# Patient Record
Sex: Male | Born: 1950 | Race: White | Hispanic: No | Marital: Married | Smoking: Former smoker
Health system: Southern US, Community
[De-identification: ages and names within clinical notes are randomized; demographics above are authoritative.]

## PROBLEM LIST (undated history)

## (undated) DIAGNOSIS — T7840XA Allergy, unspecified, initial encounter: Secondary | ICD-10-CM

## (undated) DIAGNOSIS — Z85038 Personal history of other malignant neoplasm of large intestine: Secondary | ICD-10-CM

## (undated) DIAGNOSIS — J309 Allergic rhinitis, unspecified: Secondary | ICD-10-CM

## (undated) DIAGNOSIS — H409 Unspecified glaucoma: Secondary | ICD-10-CM

## (undated) DIAGNOSIS — C801 Malignant (primary) neoplasm, unspecified: Secondary | ICD-10-CM

## (undated) DIAGNOSIS — J45909 Unspecified asthma, uncomplicated: Secondary | ICD-10-CM

## (undated) DIAGNOSIS — I10 Essential (primary) hypertension: Secondary | ICD-10-CM

## (undated) DIAGNOSIS — H269 Unspecified cataract: Secondary | ICD-10-CM

## (undated) DIAGNOSIS — Z8619 Personal history of other infectious and parasitic diseases: Secondary | ICD-10-CM

## (undated) DIAGNOSIS — R7302 Impaired glucose tolerance (oral): Secondary | ICD-10-CM

## (undated) DIAGNOSIS — Z5189 Encounter for other specified aftercare: Secondary | ICD-10-CM

## (undated) DIAGNOSIS — F329 Major depressive disorder, single episode, unspecified: Secondary | ICD-10-CM

## (undated) DIAGNOSIS — E785 Hyperlipidemia, unspecified: Secondary | ICD-10-CM

## (undated) DIAGNOSIS — K219 Gastro-esophageal reflux disease without esophagitis: Secondary | ICD-10-CM

## (undated) DIAGNOSIS — M1990 Unspecified osteoarthritis, unspecified site: Secondary | ICD-10-CM

## (undated) HISTORY — PX: APPENDECTOMY: SHX54

## (undated) HISTORY — DX: Major depressive disorder, single episode, unspecified: F32.9

## (undated) HISTORY — DX: Unspecified osteoarthritis, unspecified site: M19.90

## (undated) HISTORY — DX: Gastro-esophageal reflux disease without esophagitis: K21.9

## (undated) HISTORY — DX: Allergy, unspecified, initial encounter: T78.40XA

## (undated) HISTORY — PX: REPAIR FLEXOR TENDON HAND: SUR1175

## (undated) HISTORY — PX: TONSILLECTOMY: SHX5217

## (undated) HISTORY — DX: Unspecified cataract: H26.9

## (undated) HISTORY — PX: COLOSTOMY: SHX63

## (undated) HISTORY — DX: Unspecified glaucoma: H40.9

## (undated) HISTORY — PX: COLONOSCOPY: SHX174

## (undated) HISTORY — DX: Essential (primary) hypertension: I10

## (undated) HISTORY — DX: Encounter for other specified aftercare: Z51.89

## (undated) HISTORY — PX: OTHER SURGICAL HISTORY: SHX169

## (undated) HISTORY — DX: Impaired glucose tolerance (oral): R73.02

## (undated) HISTORY — DX: Malignant (primary) neoplasm, unspecified: C80.1

## (undated) HISTORY — PX: LIVER SURGERY: SHX698

## (undated) HISTORY — DX: Hyperlipidemia, unspecified: E78.5

## (undated) HISTORY — PX: POLYPECTOMY: SHX149

## (undated) HISTORY — DX: Allergic rhinitis, unspecified: J30.9

## (undated) HISTORY — DX: Personal history of other malignant neoplasm of large intestine: Z85.038

## (undated) HISTORY — DX: Unspecified asthma, uncomplicated: J45.909

## (undated) HISTORY — DX: Personal history of other infectious and parasitic diseases: Z86.19

---

## 1998-07-03 ENCOUNTER — Ambulatory Visit (HOSPITAL_BASED_OUTPATIENT_CLINIC_OR_DEPARTMENT_OTHER): Admission: RE | Admit: 1998-07-03 | Discharge: 1998-07-03 | Payer: Self-pay | Admitting: General Surgery

## 2000-11-17 ENCOUNTER — Encounter (INDEPENDENT_AMBULATORY_CARE_PROVIDER_SITE_OTHER): Payer: Self-pay | Admitting: Specialist

## 2000-11-17 ENCOUNTER — Other Ambulatory Visit: Admission: RE | Admit: 2000-11-17 | Discharge: 2000-11-17 | Payer: Self-pay | Admitting: Gastroenterology

## 2001-10-07 ENCOUNTER — Ambulatory Visit (HOSPITAL_COMMUNITY): Admission: RE | Admit: 2001-10-07 | Discharge: 2001-10-07 | Payer: Self-pay | Admitting: Sports Medicine

## 2001-10-07 ENCOUNTER — Encounter: Payer: Self-pay | Admitting: Sports Medicine

## 2001-11-16 ENCOUNTER — Ambulatory Visit (HOSPITAL_BASED_OUTPATIENT_CLINIC_OR_DEPARTMENT_OTHER): Admission: RE | Admit: 2001-11-16 | Discharge: 2001-11-16 | Payer: Self-pay | Admitting: Orthopedic Surgery

## 2001-12-16 HISTORY — PX: RECTAL SURGERY: SHX760

## 2002-02-08 ENCOUNTER — Encounter (INDEPENDENT_AMBULATORY_CARE_PROVIDER_SITE_OTHER): Payer: Self-pay | Admitting: *Deleted

## 2002-02-08 ENCOUNTER — Ambulatory Visit (HOSPITAL_COMMUNITY): Admission: RE | Admit: 2002-02-08 | Discharge: 2002-02-08 | Payer: Self-pay | Admitting: Family Medicine

## 2002-02-11 ENCOUNTER — Ambulatory Visit (HOSPITAL_COMMUNITY): Admission: RE | Admit: 2002-02-11 | Discharge: 2002-02-11 | Payer: Self-pay | Admitting: Gastroenterology

## 2002-02-12 ENCOUNTER — Ambulatory Visit (HOSPITAL_COMMUNITY): Admission: RE | Admit: 2002-02-12 | Discharge: 2002-02-12 | Payer: Self-pay | Admitting: Gastroenterology

## 2002-02-12 ENCOUNTER — Encounter: Payer: Self-pay | Admitting: Gastroenterology

## 2002-02-19 ENCOUNTER — Encounter (HOSPITAL_BASED_OUTPATIENT_CLINIC_OR_DEPARTMENT_OTHER): Payer: Self-pay | Admitting: General Surgery

## 2002-02-23 ENCOUNTER — Inpatient Hospital Stay (HOSPITAL_COMMUNITY): Admission: RE | Admit: 2002-02-23 | Discharge: 2002-03-05 | Payer: Self-pay | Admitting: General Surgery

## 2002-02-23 ENCOUNTER — Encounter (INDEPENDENT_AMBULATORY_CARE_PROVIDER_SITE_OTHER): Payer: Self-pay | Admitting: *Deleted

## 2002-04-08 ENCOUNTER — Ambulatory Visit: Admission: RE | Admit: 2002-04-08 | Discharge: 2002-07-07 | Payer: Self-pay | Admitting: Radiation Oncology

## 2002-07-30 ENCOUNTER — Ambulatory Visit (HOSPITAL_COMMUNITY): Admission: RE | Admit: 2002-07-30 | Discharge: 2002-07-30 | Payer: Self-pay | Admitting: General Surgery

## 2002-07-30 ENCOUNTER — Encounter (HOSPITAL_BASED_OUTPATIENT_CLINIC_OR_DEPARTMENT_OTHER): Payer: Self-pay | Admitting: General Surgery

## 2002-09-15 ENCOUNTER — Encounter: Admission: RE | Admit: 2002-09-15 | Discharge: 2002-12-14 | Payer: Self-pay

## 2002-12-29 ENCOUNTER — Encounter: Admission: RE | Admit: 2002-12-29 | Discharge: 2003-03-29 | Payer: Self-pay

## 2003-04-11 ENCOUNTER — Ambulatory Visit (HOSPITAL_COMMUNITY): Admission: RE | Admit: 2003-04-11 | Discharge: 2003-04-11 | Payer: Self-pay | Admitting: Oncology

## 2003-04-11 ENCOUNTER — Encounter (HOSPITAL_COMMUNITY): Payer: Self-pay | Admitting: Oncology

## 2003-04-26 ENCOUNTER — Emergency Department (HOSPITAL_COMMUNITY): Admission: EM | Admit: 2003-04-26 | Discharge: 2003-04-26 | Payer: Self-pay | Admitting: Emergency Medicine

## 2003-04-26 ENCOUNTER — Encounter: Payer: Self-pay | Admitting: Emergency Medicine

## 2003-05-08 ENCOUNTER — Encounter: Payer: Self-pay | Admitting: Emergency Medicine

## 2003-05-08 ENCOUNTER — Inpatient Hospital Stay (HOSPITAL_COMMUNITY): Admission: EM | Admit: 2003-05-08 | Discharge: 2003-05-10 | Payer: Self-pay | Admitting: *Deleted

## 2003-05-09 ENCOUNTER — Encounter: Payer: Self-pay | Admitting: Cardiology

## 2003-09-07 ENCOUNTER — Encounter (HOSPITAL_COMMUNITY): Payer: Self-pay | Admitting: Oncology

## 2003-09-07 ENCOUNTER — Ambulatory Visit (HOSPITAL_COMMUNITY): Admission: RE | Admit: 2003-09-07 | Discharge: 2003-09-07 | Payer: Self-pay | Admitting: Oncology

## 2004-02-14 ENCOUNTER — Ambulatory Visit (HOSPITAL_COMMUNITY): Admission: RE | Admit: 2004-02-14 | Discharge: 2004-02-14 | Payer: Self-pay | Admitting: Oncology

## 2005-01-23 ENCOUNTER — Ambulatory Visit: Payer: Self-pay | Admitting: Internal Medicine

## 2005-02-13 ENCOUNTER — Ambulatory Visit: Payer: Self-pay | Admitting: Oncology

## 2005-09-23 ENCOUNTER — Ambulatory Visit: Payer: Self-pay | Admitting: Oncology

## 2005-09-23 ENCOUNTER — Ambulatory Visit (HOSPITAL_COMMUNITY): Admission: RE | Admit: 2005-09-23 | Discharge: 2005-09-23 | Payer: Self-pay | Admitting: Oncology

## 2005-10-02 ENCOUNTER — Emergency Department (HOSPITAL_COMMUNITY): Admission: EM | Admit: 2005-10-02 | Discharge: 2005-10-03 | Payer: Self-pay | Admitting: Emergency Medicine

## 2006-03-07 ENCOUNTER — Ambulatory Visit: Payer: Self-pay | Admitting: Internal Medicine

## 2006-03-20 ENCOUNTER — Ambulatory Visit: Payer: Self-pay | Admitting: Internal Medicine

## 2006-03-26 ENCOUNTER — Ambulatory Visit: Payer: Self-pay | Admitting: Oncology

## 2006-03-27 LAB — CBC WITH DIFFERENTIAL/PLATELET
Basophils Absolute: 0 10*3/uL (ref 0.0–0.1)
EOS%: 2.8 % (ref 0.0–7.0)
Eosinophils Absolute: 0.2 10*3/uL (ref 0.0–0.5)
HGB: 14 g/dL (ref 13.0–17.1)
MCH: 30.4 pg (ref 28.0–33.4)
NEUT#: 3.2 10*3/uL (ref 1.5–6.5)
RBC: 4.61 10*6/uL (ref 4.20–5.71)
RDW: 12.6 % (ref 11.2–14.6)
lymph#: 1.7 10*3/uL (ref 0.9–3.3)

## 2006-03-27 LAB — COMPREHENSIVE METABOLIC PANEL
ALT: 22 U/L (ref 0–40)
Albumin: 4.2 g/dL (ref 3.5–5.2)
Alkaline Phosphatase: 51 U/L (ref 39–117)
Potassium: 3.8 mEq/L (ref 3.5–5.3)
Sodium: 137 mEq/L (ref 135–145)
Total Bilirubin: 0.5 mg/dL (ref 0.3–1.2)
Total Protein: 7.1 g/dL (ref 6.0–8.3)

## 2006-04-11 ENCOUNTER — Ambulatory Visit: Payer: Self-pay | Admitting: Internal Medicine

## 2006-04-14 ENCOUNTER — Ambulatory Visit: Payer: Self-pay | Admitting: Internal Medicine

## 2006-09-25 ENCOUNTER — Ambulatory Visit: Payer: Self-pay | Admitting: Oncology

## 2006-10-09 ENCOUNTER — Ambulatory Visit (HOSPITAL_COMMUNITY): Admission: RE | Admit: 2006-10-09 | Discharge: 2006-10-09 | Payer: Self-pay | Admitting: Oncology

## 2006-10-22 ENCOUNTER — Encounter (INDEPENDENT_AMBULATORY_CARE_PROVIDER_SITE_OTHER): Payer: Self-pay | Admitting: Specialist

## 2006-10-22 ENCOUNTER — Ambulatory Visit (HOSPITAL_COMMUNITY): Admission: RE | Admit: 2006-10-22 | Discharge: 2006-10-22 | Payer: Self-pay | Admitting: Oncology

## 2006-10-23 ENCOUNTER — Ambulatory Visit (HOSPITAL_COMMUNITY): Admission: RE | Admit: 2006-10-23 | Discharge: 2006-10-23 | Payer: Self-pay | Admitting: Oncology

## 2006-10-28 LAB — CEA: CEA: 9.8 ng/mL — ABNORMAL HIGH (ref 0.0–5.0)

## 2006-10-28 LAB — COMPREHENSIVE METABOLIC PANEL
Alkaline Phosphatase: 56 U/L (ref 39–117)
CO2: 25 mEq/L (ref 19–32)
Creatinine, Ser: 0.92 mg/dL (ref 0.40–1.50)
Glucose, Bld: 91 mg/dL (ref 70–99)
Sodium: 136 mEq/L (ref 135–145)
Total Bilirubin: 0.5 mg/dL (ref 0.3–1.2)

## 2006-10-28 LAB — CBC WITH DIFFERENTIAL/PLATELET
Eosinophils Absolute: 0.1 10*3/uL (ref 0.0–0.5)
HCT: 38.3 % — ABNORMAL LOW (ref 38.7–49.9)
LYMPH%: 28.5 % (ref 14.0–48.0)
MCHC: 34.8 g/dL (ref 32.0–35.9)
MCV: 89.6 fL (ref 81.6–98.0)
MONO#: 0.6 10*3/uL (ref 0.1–0.9)
MONO%: 10 % (ref 0.0–13.0)
NEUT#: 3.8 10*3/uL (ref 1.5–6.5)
NEUT%: 59.6 % (ref 40.0–75.0)
Platelets: 282 10*3/uL (ref 145–400)
RBC: 4.27 10*6/uL (ref 4.20–5.71)
WBC: 6.4 10*3/uL (ref 4.0–10.0)

## 2006-10-28 LAB — LACTATE DEHYDROGENASE: LDH: 197 U/L (ref 94–250)

## 2006-11-07 LAB — CBC WITH DIFFERENTIAL/PLATELET
Basophils Absolute: 0 10*3/uL (ref 0.0–0.1)
EOS%: 2.4 % (ref 0.0–7.0)
Eosinophils Absolute: 0.2 10*3/uL (ref 0.0–0.5)
HCT: 40 % (ref 38.7–49.9)
HGB: 13.9 g/dL (ref 13.0–17.1)
MCH: 31 pg (ref 28.0–33.4)
MONO#: 0.7 10*3/uL (ref 0.1–0.9)
NEUT#: 4.7 10*3/uL (ref 1.5–6.5)
NEUT%: 62.9 % (ref 40.0–75.0)
RDW: 11.8 % (ref 11.2–14.6)
WBC: 7.5 10*3/uL (ref 4.0–10.0)
lymph#: 1.9 10*3/uL (ref 0.9–3.3)

## 2006-11-11 ENCOUNTER — Ambulatory Visit (HOSPITAL_COMMUNITY): Admission: RE | Admit: 2006-11-11 | Discharge: 2006-11-11 | Payer: Self-pay | Admitting: Oncology

## 2006-11-12 ENCOUNTER — Ambulatory Visit: Payer: Self-pay | Admitting: Oncology

## 2006-11-14 LAB — COMPREHENSIVE METABOLIC PANEL
Albumin: 3.9 g/dL (ref 3.5–5.2)
BUN: 14 mg/dL (ref 6–23)
CO2: 25 mEq/L (ref 19–32)
Calcium: 8.8 mg/dL (ref 8.4–10.5)
Chloride: 102 mEq/L (ref 96–112)
Creatinine, Ser: 1 mg/dL (ref 0.40–1.50)
Potassium: 3.9 mEq/L (ref 3.5–5.3)

## 2006-11-14 LAB — CBC WITH DIFFERENTIAL/PLATELET
Basophils Absolute: 0 10*3/uL (ref 0.0–0.1)
Eosinophils Absolute: 0.1 10*3/uL (ref 0.0–0.5)
HCT: 35.5 % — ABNORMAL LOW (ref 38.7–49.9)
HGB: 12.6 g/dL — ABNORMAL LOW (ref 13.0–17.1)
LYMPH%: 24 % (ref 14.0–48.0)
MONO#: 0.5 10*3/uL (ref 0.1–0.9)
NEUT#: 3.9 10*3/uL (ref 1.5–6.5)
NEUT%: 64.6 % (ref 40.0–75.0)
Platelets: 291 10*3/uL (ref 145–400)
WBC: 6.1 10*3/uL (ref 4.0–10.0)

## 2006-11-14 LAB — LACTATE DEHYDROGENASE: LDH: 164 U/L (ref 94–250)

## 2006-11-21 LAB — CBC WITH DIFFERENTIAL/PLATELET
BASO%: 0.4 % (ref 0.0–2.0)
EOS%: 4.5 % (ref 0.0–7.0)
MCH: 31.3 pg (ref 28.0–33.4)
MCHC: 35 g/dL (ref 32.0–35.9)
MONO#: 0.5 10*3/uL (ref 0.1–0.9)
NEUT%: 49.7 % (ref 40.0–75.0)
RBC: 4.1 10*6/uL — ABNORMAL LOW (ref 4.20–5.71)
RDW: 12.7 % (ref 11.2–14.6)
WBC: 5.1 10*3/uL (ref 4.0–10.0)
lymph#: 1.8 10*3/uL (ref 0.9–3.3)

## 2006-11-27 LAB — CBC WITH DIFFERENTIAL/PLATELET
BASO%: 0.4 % (ref 0.0–2.0)
Basophils Absolute: 0 10*3/uL (ref 0.0–0.1)
EOS%: 3.6 % (ref 0.0–7.0)
HCT: 35.1 % — ABNORMAL LOW (ref 38.7–49.9)
HGB: 12.3 g/dL — ABNORMAL LOW (ref 13.0–17.1)
MCH: 31.4 pg (ref 28.0–33.4)
MONO#: 0.6 10*3/uL (ref 0.1–0.9)
NEUT%: 46.6 % (ref 40.0–75.0)
RDW: 12.1 % (ref 11.2–14.6)
WBC: 4.5 10*3/uL (ref 4.0–10.0)
lymph#: 1.6 10*3/uL (ref 0.9–3.3)

## 2006-11-27 LAB — COMPREHENSIVE METABOLIC PANEL
AST: 24 U/L (ref 0–37)
Albumin: 3.9 g/dL (ref 3.5–5.2)
Alkaline Phosphatase: 63 U/L (ref 39–117)
Calcium: 8.7 mg/dL (ref 8.4–10.5)
Chloride: 103 mEq/L (ref 96–112)
Potassium: 4.1 mEq/L (ref 3.5–5.3)
Sodium: 136 mEq/L (ref 135–145)
Total Protein: 6.4 g/dL (ref 6.0–8.3)

## 2006-12-04 LAB — CBC WITH DIFFERENTIAL/PLATELET
Basophils Absolute: 0.1 10*3/uL (ref 0.0–0.1)
Eosinophils Absolute: 0.3 10*3/uL (ref 0.0–0.5)
HGB: 13.2 g/dL (ref 13.0–17.1)
LYMPH%: 53.5 % — ABNORMAL HIGH (ref 14.0–48.0)
MCV: 86.6 fL (ref 81.6–98.0)
MONO%: 9 % (ref 0.0–13.0)
NEUT#: 0.9 10*3/uL — ABNORMAL LOW (ref 1.5–6.5)
Platelets: 195 10*3/uL (ref 145–400)
RBC: 4.21 10*6/uL (ref 4.20–5.71)

## 2006-12-12 LAB — COMPREHENSIVE METABOLIC PANEL
ALT: 34 U/L (ref 0–53)
Alkaline Phosphatase: 80 U/L (ref 39–117)
Sodium: 140 mEq/L (ref 135–145)
Total Bilirubin: 1 mg/dL (ref 0.3–1.2)
Total Protein: 6.9 g/dL (ref 6.0–8.3)

## 2006-12-12 LAB — CBC WITH DIFFERENTIAL/PLATELET
BASO%: 0.2 % (ref 0.0–2.0)
LYMPH%: 38.3 % (ref 14.0–48.0)
MCHC: 35.1 g/dL (ref 32.0–35.9)
MCV: 91.6 fL (ref 81.6–98.0)
MONO%: 14.5 % — ABNORMAL HIGH (ref 0.0–13.0)
Platelets: 182 10*3/uL (ref 145–400)
RBC: 4.24 10*6/uL (ref 4.20–5.71)

## 2007-01-06 ENCOUNTER — Ambulatory Visit: Payer: Self-pay | Admitting: Oncology

## 2007-01-20 ENCOUNTER — Ambulatory Visit: Payer: Self-pay | Admitting: Internal Medicine

## 2007-02-03 LAB — CBC WITH DIFFERENTIAL/PLATELET
Basophils Absolute: 0 10*3/uL (ref 0.0–0.1)
EOS%: 2 % (ref 0.0–7.0)
HCT: 35.2 % — ABNORMAL LOW (ref 38.7–49.9)
HGB: 12.4 g/dL — ABNORMAL LOW (ref 13.0–17.1)
LYMPH%: 25.7 % (ref 14.0–48.0)
MCH: 32 pg (ref 28.0–33.4)
MCHC: 35.2 g/dL (ref 32.0–35.9)
MCV: 90.9 fL (ref 81.6–98.0)
MONO%: 7.2 % (ref 0.0–13.0)
NEUT%: 64.5 % (ref 40.0–75.0)
Platelets: 578 10*3/uL — ABNORMAL HIGH (ref 145–400)

## 2007-02-03 LAB — COMPREHENSIVE METABOLIC PANEL
AST: 16 U/L (ref 0–37)
Alkaline Phosphatase: 73 U/L (ref 39–117)
BUN: 20 mg/dL (ref 6–23)
Calcium: 9.4 mg/dL (ref 8.4–10.5)
Chloride: 102 mEq/L (ref 96–112)
Creatinine, Ser: 1.39 mg/dL (ref 0.40–1.50)
Total Bilirubin: 0.4 mg/dL (ref 0.3–1.2)

## 2007-02-19 LAB — COMPREHENSIVE METABOLIC PANEL
ALT: 12 U/L (ref 0–53)
Alkaline Phosphatase: 62 U/L (ref 39–117)
CO2: 25 mEq/L (ref 19–32)
Creatinine, Ser: 1.03 mg/dL (ref 0.40–1.50)
Total Bilirubin: 0.5 mg/dL (ref 0.3–1.2)

## 2007-02-19 LAB — CBC WITH DIFFERENTIAL/PLATELET
EOS%: 3.5 % (ref 0.0–7.0)
Eosinophils Absolute: 0.2 10*3/uL (ref 0.0–0.5)
LYMPH%: 24 % (ref 14.0–48.0)
MCH: 31.5 pg (ref 28.0–33.4)
MCHC: 34.8 g/dL (ref 32.0–35.9)
MCV: 90.6 fL (ref 81.6–98.0)
MONO%: 8.2 % (ref 0.0–13.0)
NEUT#: 4.3 10*3/uL (ref 1.5–6.5)
Platelets: 224 10*3/uL (ref 145–400)
RBC: 4.11 10*6/uL — ABNORMAL LOW (ref 4.20–5.71)

## 2007-02-19 LAB — LACTATE DEHYDROGENASE: LDH: 138 U/L (ref 94–250)

## 2007-02-19 LAB — UA PROTEIN, DIPSTICK - CHCC: Protein, Urine: NEGATIVE mg/dL

## 2007-02-19 LAB — CEA: CEA: 0.8 ng/mL (ref 0.0–5.0)

## 2007-02-24 ENCOUNTER — Ambulatory Visit: Payer: Self-pay | Admitting: Oncology

## 2007-02-26 LAB — CBC WITH DIFFERENTIAL/PLATELET
Basophils Absolute: 0 10*3/uL (ref 0.0–0.1)
HCT: 36.5 % — ABNORMAL LOW (ref 38.7–49.9)
HGB: 13 g/dL (ref 13.0–17.1)
MONO#: 0.4 10*3/uL (ref 0.1–0.9)
NEUT#: 4.4 10*3/uL (ref 1.5–6.5)
NEUT%: 63.5 % (ref 40.0–75.0)
RDW: 12.3 % (ref 11.2–14.6)
WBC: 6.9 10*3/uL (ref 4.0–10.0)
lymph#: 1.8 10*3/uL (ref 0.9–3.3)

## 2007-03-06 LAB — COMPREHENSIVE METABOLIC PANEL WITH GFR
ALT: 19 U/L (ref 0–53)
AST: 18 U/L (ref 0–37)
Albumin: 3.6 g/dL (ref 3.5–5.2)
Alkaline Phosphatase: 61 U/L (ref 39–117)
BUN: 14 mg/dL (ref 6–23)
CO2: 27 meq/L (ref 19–32)
Calcium: 9 mg/dL (ref 8.4–10.5)
Chloride: 101 meq/L (ref 96–112)
Creatinine, Ser: 0.91 mg/dL (ref 0.40–1.50)
Glucose, Bld: 105 mg/dL — ABNORMAL HIGH (ref 70–99)
Potassium: 3.6 meq/L (ref 3.5–5.3)
Sodium: 137 meq/L (ref 135–145)
Total Bilirubin: 0.4 mg/dL (ref 0.3–1.2)
Total Protein: 6.3 g/dL (ref 6.0–8.3)

## 2007-03-06 LAB — CBC WITH DIFFERENTIAL/PLATELET
BASO%: 0.8 % (ref 0.0–2.0)
Basophils Absolute: 0 10e3/uL (ref 0.0–0.1)
EOS%: 3.5 % (ref 0.0–7.0)
Eosinophils Absolute: 0.2 10e3/uL (ref 0.0–0.5)
HCT: 36.3 % — ABNORMAL LOW (ref 38.7–49.9)
HGB: 13.1 g/dL (ref 13.0–17.1)
LYMPH%: 32.2 % (ref 14.0–48.0)
MCH: 31.4 pg (ref 28.0–33.4)
MCHC: 36 g/dL — ABNORMAL HIGH (ref 32.0–35.9)
MCV: 87.2 fL (ref 81.6–98.0)
MONO#: 0.6 10e3/uL (ref 0.1–0.9)
MONO%: 11.1 % (ref 0.0–13.0)
NEUT#: 2.8 10e3/uL (ref 1.5–6.5)
NEUT%: 52.5 % (ref 40.0–75.0)
Platelets: 251 10e3/uL (ref 145–400)
RBC: 4.17 10e6/uL — ABNORMAL LOW (ref 4.20–5.71)
RDW: 11.9 % (ref 11.2–14.6)
WBC: 5.3 10e3/uL (ref 4.0–10.0)
lymph#: 1.7 10e3/uL (ref 0.9–3.3)

## 2007-03-06 LAB — UA PROTEIN, DIPSTICK - CHCC: Protein, Urine: <30 mg/dL

## 2007-03-06 LAB — LACTATE DEHYDROGENASE: LDH: 131 U/L (ref 94–250)

## 2007-03-20 LAB — CBC WITH DIFFERENTIAL/PLATELET
EOS%: 2 % (ref 0.0–7.0)
Eosinophils Absolute: 0.1 10*3/uL (ref 0.0–0.5)
MCV: 88.4 fL (ref 81.6–98.0)
MONO%: 12 % (ref 0.0–13.0)
NEUT#: 3.3 10*3/uL (ref 1.5–6.5)
RBC: 4.44 10*6/uL (ref 4.20–5.71)
RDW: 13.6 % (ref 11.2–14.6)

## 2007-03-27 LAB — CBC WITH DIFFERENTIAL/PLATELET
BASO%: 2.8 % — ABNORMAL HIGH (ref 0.0–2.0)
HCT: 39.8 % (ref 38.7–49.9)
MCHC: 35.8 g/dL (ref 32.0–35.9)
MONO#: 0.7 10*3/uL (ref 0.1–0.9)
RBC: 4.5 10*6/uL (ref 4.20–5.71)
RDW: 13.6 % (ref 11.2–14.6)
WBC: 3.8 10*3/uL — ABNORMAL LOW (ref 4.0–10.0)
lymph#: 2 10*3/uL (ref 0.9–3.3)

## 2007-03-27 LAB — UA PROTEIN, DIPSTICK - CHCC: Protein, Urine: NEGATIVE mg/dL

## 2007-04-03 LAB — COMPREHENSIVE METABOLIC PANEL
ALT: 19 U/L (ref 0–53)
AST: 20 U/L (ref 0–37)
Albumin: 4 g/dL (ref 3.5–5.2)
Alkaline Phosphatase: 67 U/L (ref 39–117)
BUN: 17 mg/dL (ref 6–23)
CO2: 25 mEq/L (ref 19–32)
Calcium: 8.8 mg/dL (ref 8.4–10.5)
Chloride: 100 mEq/L (ref 96–112)
Creatinine, Ser: 0.87 mg/dL (ref 0.40–1.50)
Glucose, Bld: 105 mg/dL — ABNORMAL HIGH (ref 70–99)
Potassium: 3.9 mEq/L (ref 3.5–5.3)
Sodium: 137 mEq/L (ref 135–145)
Total Bilirubin: 0.5 mg/dL (ref 0.3–1.2)
Total Protein: 6.7 g/dL (ref 6.0–8.3)

## 2007-04-03 LAB — CBC WITH DIFFERENTIAL/PLATELET
Basophils Absolute: 0.2 10*3/uL — ABNORMAL HIGH (ref 0.0–0.1)
HCT: 42.3 % (ref 38.7–49.9)
HGB: 15.3 g/dL (ref 13.0–17.1)
MONO#: 0.7 10*3/uL (ref 0.1–0.9)
NEUT#: 3.7 10*3/uL (ref 1.5–6.5)
NEUT%: 49.1 % (ref 40.0–75.0)
WBC: 7.6 10*3/uL (ref 4.0–10.0)
lymph#: 2.7 10*3/uL (ref 0.9–3.3)

## 2007-04-03 LAB — LACTATE DEHYDROGENASE: LDH: 150 U/L (ref 94–250)

## 2007-04-09 ENCOUNTER — Ambulatory Visit: Payer: Self-pay | Admitting: Internal Medicine

## 2007-04-09 LAB — CBC WITH DIFFERENTIAL/PLATELET
BASO%: 0.2 % (ref 0.0–2.0)
EOS%: 1.1 % (ref 0.0–7.0)
MCH: 32.5 pg (ref 28.0–33.4)
MCHC: 36.3 g/dL — ABNORMAL HIGH (ref 32.0–35.9)
MONO#: 0.3 10*3/uL (ref 0.1–0.9)
RBC: 4.3 10*6/uL (ref 4.20–5.71)
WBC: 7.3 10*3/uL (ref 4.0–10.0)
lymph#: 2.3 10*3/uL (ref 0.9–3.3)

## 2007-04-14 ENCOUNTER — Ambulatory Visit: Payer: Self-pay | Admitting: Oncology

## 2007-04-17 LAB — CBC WITH DIFFERENTIAL/PLATELET
BASO%: 0.3 % (ref 0.0–2.0)
Basophils Absolute: 0 10*3/uL (ref 0.0–0.1)
EOS%: 4 % (ref 0.0–7.0)
HCT: 38.9 % (ref 38.7–49.9)
HGB: 13.9 g/dL (ref 13.0–17.1)
MCHC: 35.8 g/dL (ref 32.0–35.9)
MONO#: 0.8 10*3/uL (ref 0.1–0.9)
NEUT%: 63.7 % (ref 40.0–75.0)
RDW: 15.8 % — ABNORMAL HIGH (ref 11.2–14.6)
WBC: 8.2 10*3/uL (ref 4.0–10.0)
lymph#: 1.9 10*3/uL (ref 0.9–3.3)

## 2007-04-21 ENCOUNTER — Ambulatory Visit: Payer: Self-pay | Admitting: Cardiology

## 2007-04-22 LAB — COMPREHENSIVE METABOLIC PANEL
AST: 18 U/L (ref 0–37)
Albumin: 3.9 g/dL (ref 3.5–5.2)
Alkaline Phosphatase: 72 U/L (ref 39–117)
Glucose, Bld: 123 mg/dL — ABNORMAL HIGH (ref 70–99)
Potassium: 3.5 mEq/L (ref 3.5–5.3)
Sodium: 136 mEq/L (ref 135–145)
Total Bilirubin: 0.4 mg/dL (ref 0.3–1.2)
Total Protein: 6.8 g/dL (ref 6.0–8.3)

## 2007-04-22 LAB — CBC WITH DIFFERENTIAL/PLATELET
Basophils Absolute: 0.1 10*3/uL (ref 0.0–0.1)
EOS%: 14.1 % — ABNORMAL HIGH (ref 0.0–7.0)
Eosinophils Absolute: 0.7 10*3/uL — ABNORMAL HIGH (ref 0.0–0.5)
HCT: 37.5 % — ABNORMAL LOW (ref 38.7–49.9)
HGB: 13.6 g/dL (ref 13.0–17.1)
MCH: 32.2 pg (ref 28.0–33.4)
MONO#: 0.7 10*3/uL (ref 0.1–0.9)
NEUT#: 1.1 10*3/uL — ABNORMAL LOW (ref 1.5–6.5)
NEUT%: 22.4 % — ABNORMAL LOW (ref 40.0–75.0)
RDW: 15.7 % — ABNORMAL HIGH (ref 11.2–14.6)
lymph#: 2.4 10*3/uL (ref 0.9–3.3)

## 2007-04-22 LAB — LACTATE DEHYDROGENASE: LDH: 138 U/L (ref 94–250)

## 2007-04-22 LAB — CEA: CEA: 1.1 ng/mL (ref 0.0–5.0)

## 2007-04-24 LAB — CBC WITH DIFFERENTIAL/PLATELET
Basophils Absolute: 0.2 10*3/uL — ABNORMAL HIGH (ref 0.0–0.1)
HCT: 41.4 % (ref 38.7–49.9)
HGB: 14.9 g/dL (ref 13.0–17.1)
LYMPH%: 19.2 % (ref 14.0–48.0)
MCH: 31.7 pg (ref 28.0–33.4)
MCHC: 36.1 g/dL — ABNORMAL HIGH (ref 32.0–35.9)
MONO#: 1.2 10*3/uL — ABNORMAL HIGH (ref 0.1–0.9)
NEUT%: 59.4 % (ref 40.0–75.0)
Platelets: 318 10*3/uL (ref 145–400)
WBC: 11.2 10*3/uL — ABNORMAL HIGH (ref 4.0–10.0)
lymph#: 2.2 10*3/uL (ref 0.9–3.3)

## 2007-05-01 LAB — CBC WITH DIFFERENTIAL/PLATELET
BASO%: 0.3 % (ref 0.0–2.0)
Basophils Absolute: 0 10*3/uL (ref 0.0–0.1)
EOS%: 3 % (ref 0.0–7.0)
HCT: 40.3 % (ref 38.7–49.9)
HGB: 14.7 g/dL (ref 13.0–17.1)
LYMPH%: 25.8 % (ref 14.0–48.0)
MCH: 32.5 pg (ref 28.0–33.4)
MCHC: 36.3 g/dL — ABNORMAL HIGH (ref 32.0–35.9)
NEUT%: 68.7 % (ref 40.0–75.0)
Platelets: 113 10*3/uL — ABNORMAL LOW (ref 145–400)
lymph#: 1.5 10*3/uL (ref 0.9–3.3)

## 2007-05-07 ENCOUNTER — Ambulatory Visit: Payer: Self-pay

## 2007-05-08 LAB — CBC WITH DIFFERENTIAL/PLATELET
BASO%: 1.6 % (ref 0.0–2.0)
Basophils Absolute: 0.2 10*3/uL — ABNORMAL HIGH (ref 0.0–0.1)
EOS%: 4.3 % (ref 0.0–7.0)
HCT: 40 % (ref 38.7–49.9)
HGB: 14.3 g/dL (ref 13.0–17.1)
MCH: 31.6 pg (ref 28.0–33.4)
MCHC: 35.6 g/dL (ref 32.0–35.9)
MCV: 88.6 fL (ref 81.6–98.0)
MONO%: 7.3 % (ref 0.0–13.0)
NEUT%: 58.5 % (ref 40.0–75.0)
RDW: 13.8 % (ref 11.2–14.6)
lymph#: 3.2 10*3/uL (ref 0.9–3.3)

## 2007-05-15 LAB — CBC WITH DIFFERENTIAL/PLATELET
BASO%: 0.7 % (ref 0.0–2.0)
EOS%: 3.3 % (ref 0.0–7.0)
LYMPH%: 25.9 % (ref 14.0–48.0)
MCHC: 36.5 g/dL — ABNORMAL HIGH (ref 32.0–35.9)
MCV: 89.6 fL (ref 81.6–98.0)
MONO%: 3.7 % (ref 0.0–13.0)
Platelets: 183 10*3/uL (ref 145–400)
RBC: 4.12 10*6/uL — ABNORMAL LOW (ref 4.20–5.71)
RDW: 16.1 % — ABNORMAL HIGH (ref 11.2–14.6)

## 2007-05-22 LAB — CBC WITH DIFFERENTIAL/PLATELET
BASO%: 0.8 % (ref 0.0–2.0)
LYMPH%: 23.1 % (ref 14.0–48.0)
MCHC: 34.9 g/dL (ref 32.0–35.9)
MONO#: 1 10*3/uL — ABNORMAL HIGH (ref 0.1–0.9)
NEUT#: 7.4 10*3/uL — ABNORMAL HIGH (ref 1.5–6.5)
RBC: 4.43 10*6/uL (ref 4.20–5.71)
RDW: 15.2 % — ABNORMAL HIGH (ref 11.2–14.6)
WBC: 11.5 10*3/uL — ABNORMAL HIGH (ref 4.0–10.0)
lymph#: 2.7 10*3/uL (ref 0.9–3.3)

## 2007-05-22 LAB — COMPREHENSIVE METABOLIC PANEL
ALT: 14 U/L (ref 0–53)
Albumin: 4 g/dL (ref 3.5–5.2)
CO2: 22 mEq/L (ref 19–32)
Potassium: 4 mEq/L (ref 3.5–5.3)
Sodium: 134 mEq/L — ABNORMAL LOW (ref 135–145)
Total Bilirubin: 0.9 mg/dL (ref 0.3–1.2)
Total Protein: 6.8 g/dL (ref 6.0–8.3)

## 2007-05-22 LAB — LACTATE DEHYDROGENASE: LDH: 222 U/L (ref 94–250)

## 2007-05-29 LAB — CBC WITH DIFFERENTIAL/PLATELET
BASO%: 0.2 % (ref 0.0–2.0)
Eosinophils Absolute: 0.2 10*3/uL (ref 0.0–0.5)
HCT: 32.3 % — ABNORMAL LOW (ref 38.7–49.9)
LYMPH%: 10.9 % — ABNORMAL LOW (ref 14.0–48.0)
MONO#: 0.7 10*3/uL (ref 0.1–0.9)
NEUT#: 17.1 10*3/uL — ABNORMAL HIGH (ref 1.5–6.5)
NEUT%: 84.6 % — ABNORMAL HIGH (ref 40.0–75.0)
Platelets: 191 10*3/uL (ref 145–400)
WBC: 20.2 10*3/uL — ABNORMAL HIGH (ref 4.0–10.0)
lymph#: 2.2 10*3/uL (ref 0.9–3.3)

## 2007-06-03 ENCOUNTER — Ambulatory Visit: Payer: Self-pay | Admitting: Oncology

## 2007-06-05 LAB — CBC WITH DIFFERENTIAL/PLATELET
BASO%: 0.6 % (ref 0.0–2.0)
Basophils Absolute: 0.1 10*3/uL (ref 0.0–0.1)
HCT: 35.6 % — ABNORMAL LOW (ref 38.7–49.9)
HGB: 12.5 g/dL — ABNORMAL LOW (ref 13.0–17.1)
LYMPH%: 14.7 % (ref 14.0–48.0)
MCH: 33.2 pg (ref 28.0–33.4)
MCHC: 35.2 g/dL (ref 32.0–35.9)
MONO#: 0.7 10*3/uL (ref 0.1–0.9)
NEUT%: 75 % (ref 40.0–75.0)
Platelets: 167 10*3/uL (ref 145–400)
WBC: 11 10*3/uL — ABNORMAL HIGH (ref 4.0–10.0)
lymph#: 1.6 10*3/uL (ref 0.9–3.3)

## 2007-06-12 LAB — COMPREHENSIVE METABOLIC PANEL
ALT: 10 U/L (ref 0–53)
AST: 16 U/L (ref 0–37)
Alkaline Phosphatase: 80 U/L (ref 39–117)
BUN: 14 mg/dL (ref 6–23)
Calcium: 9.5 mg/dL (ref 8.4–10.5)
Chloride: 103 mEq/L (ref 96–112)
Creatinine, Ser: 1 mg/dL (ref 0.40–1.50)
Total Bilirubin: 0.7 mg/dL (ref 0.3–1.2)

## 2007-06-12 LAB — CBC WITH DIFFERENTIAL/PLATELET
BASO%: 0.9 % (ref 0.0–2.0)
Basophils Absolute: 0.1 10*3/uL (ref 0.0–0.1)
EOS%: 8.8 % — ABNORMAL HIGH (ref 0.0–7.0)
HCT: 39.1 % (ref 38.7–49.9)
HGB: 14.3 g/dL (ref 13.0–17.1)
LYMPH%: 27.2 % (ref 14.0–48.0)
MCH: 33.5 pg — ABNORMAL HIGH (ref 28.0–33.4)
MCHC: 36.5 g/dL — ABNORMAL HIGH (ref 32.0–35.9)
MCV: 91.9 fL (ref 81.6–98.0)
MONO%: 6.9 % (ref 0.0–13.0)
NEUT%: 56.3 % (ref 40.0–75.0)

## 2007-06-26 LAB — CBC WITH DIFFERENTIAL/PLATELET
Basophils Absolute: 0.1 10*3/uL (ref 0.0–0.1)
Eosinophils Absolute: 0.7 10*3/uL — ABNORMAL HIGH (ref 0.0–0.5)
HGB: 12.9 g/dL — ABNORMAL LOW (ref 13.0–17.1)
LYMPH%: 29.5 % (ref 14.0–48.0)
MCV: 93.7 fL (ref 81.6–98.0)
MONO%: 6.6 % (ref 0.0–13.0)
NEUT#: 3.7 10*3/uL (ref 1.5–6.5)
NEUT%: 53.3 % (ref 40.0–75.0)
Platelets: 153 10*3/uL (ref 145–400)

## 2007-07-01 LAB — CBC WITH DIFFERENTIAL/PLATELET
BASO%: 0.6 % (ref 0.0–2.0)
Eosinophils Absolute: 0.3 10*3/uL (ref 0.0–0.5)
LYMPH%: 26.4 % (ref 14.0–48.0)
MCHC: 35.2 g/dL (ref 32.0–35.9)
MCV: 95.4 fL (ref 81.6–98.0)
MONO%: 11.4 % (ref 0.0–13.0)
Platelets: 217 10*3/uL (ref 145–400)
RBC: 3.82 10*6/uL — ABNORMAL LOW (ref 4.20–5.71)

## 2007-07-01 LAB — UA PROTEIN, DIPSTICK - CHCC: Protein, Urine: <30 mg/dL

## 2007-07-01 LAB — COMPREHENSIVE METABOLIC PANEL
ALT: 14 U/L (ref 0–53)
CO2: 24 mEq/L (ref 19–32)
Creatinine, Ser: 1.14 mg/dL (ref 0.40–1.50)
Total Bilirubin: 0.6 mg/dL (ref 0.3–1.2)

## 2007-07-21 IMAGING — PT NM PET TUM IMG SKULL BASE T - THIGH
4 series · 25 of 25 positions shown · non-contrast
Comparison: CT from 10/09/06.

CLINICAL DATA: Anal/rectal cancer. 
FDG PET-CT TUMOR IMAGING (SKULL BASE TO THIGHS):
Fasting Blood Glucose:  106.
TECHNIQUE: 19.4 mCi F-18 FDG were administered via right antecubital fossa.  Full ring PET imaging was performed from the skull base through the mid-thighs 55 minutes after injection.  CT data was obtained and used for attenuation correction and anatomic localization only.  (This was not acquired as a diagnostic CT examination).

[Series 1: pet ac · axial · 3.3mm · 4.69mm/px · z∈[-870,+0]mm · 8 of 267 slices shown]
[im 1/267]
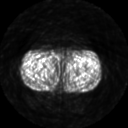
[im 39/267]
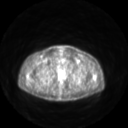
[im 77/267]
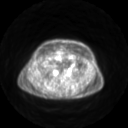
[im 115/267]
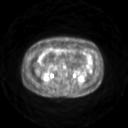
[im 153/267]
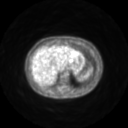
[im 191/267]
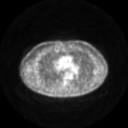
[im 229/267]
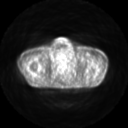
[im 267/267]
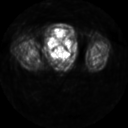

[Series 2: pet nac · axial · 3.3mm · 4.69mm/px · z∈[-870,+0]mm · 8 of 267 slices shown]
[im 1/267]
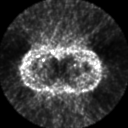
[im 39/267]
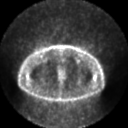
[im 77/267]
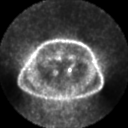
[im 115/267]
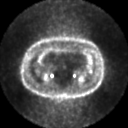
[im 153/267]
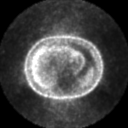
[im 191/267]
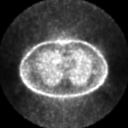
[im 229/267]
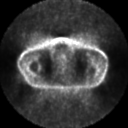
[im 267/267]
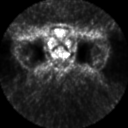

[Series 2: ct images · axial · 3.8mm · 0.98mm/px · z∈[-870,+0]mm · 8 of 267 slices shown]
[im 1/267]
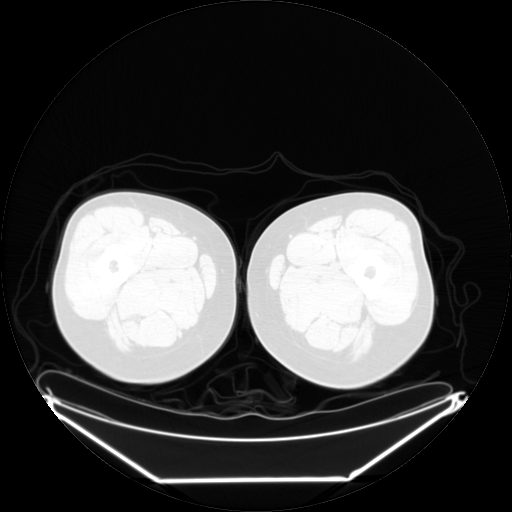
[im 39/267]
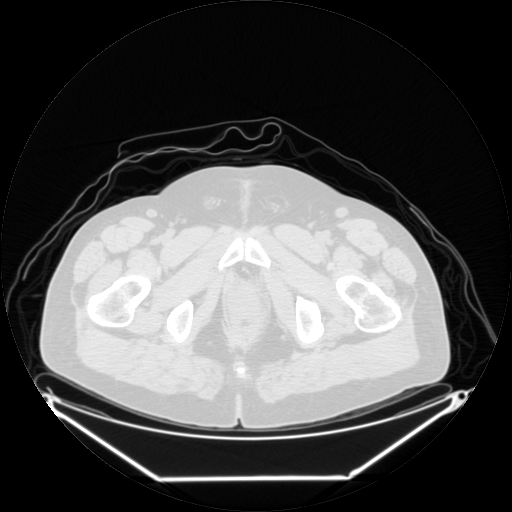
[im 77/267]
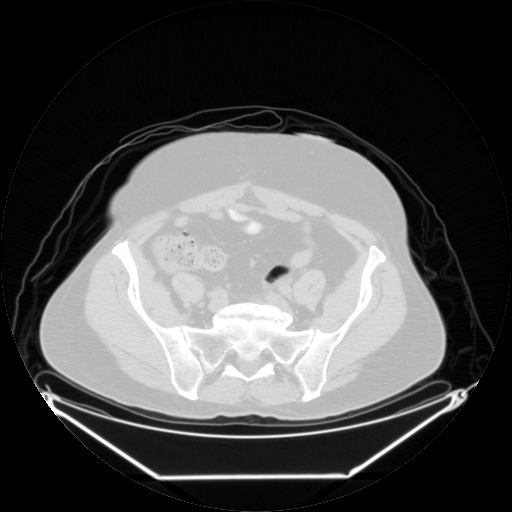
[im 115/267]
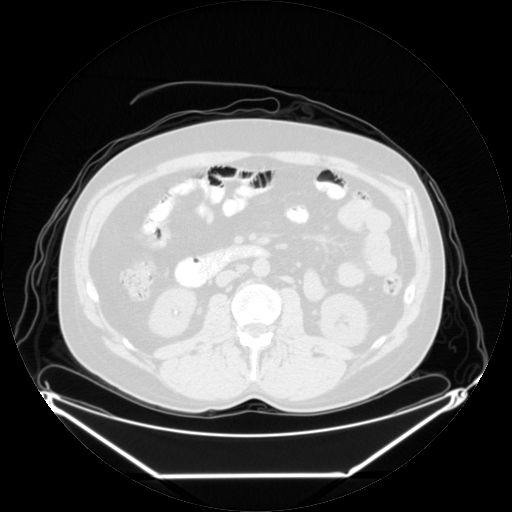
[im 153/267]
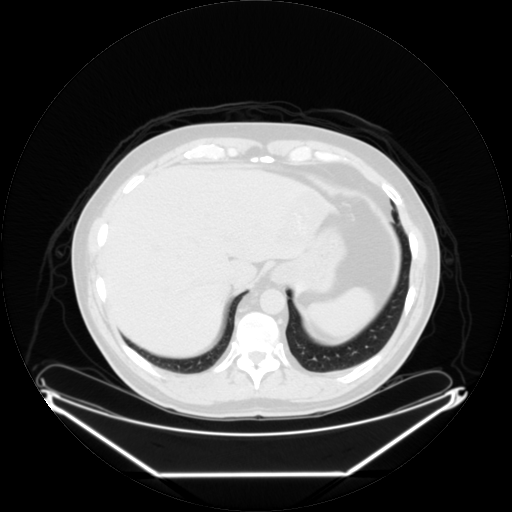
[im 191/267]
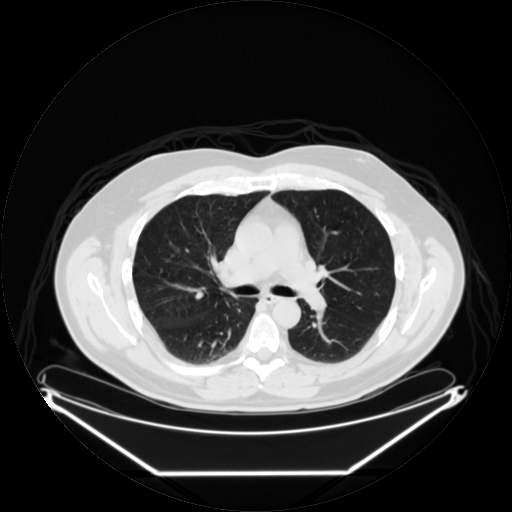
[im 229/267]
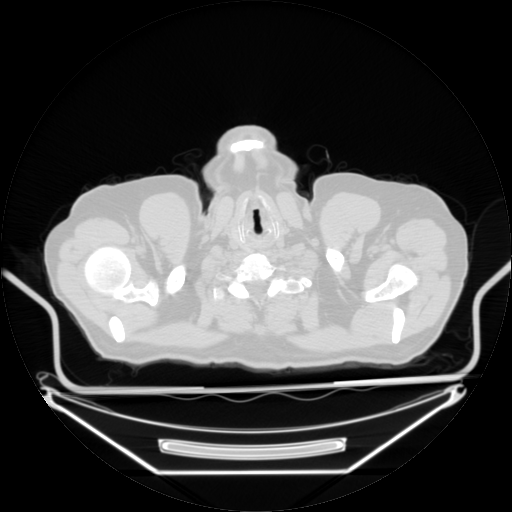
[im 267/267  brain]
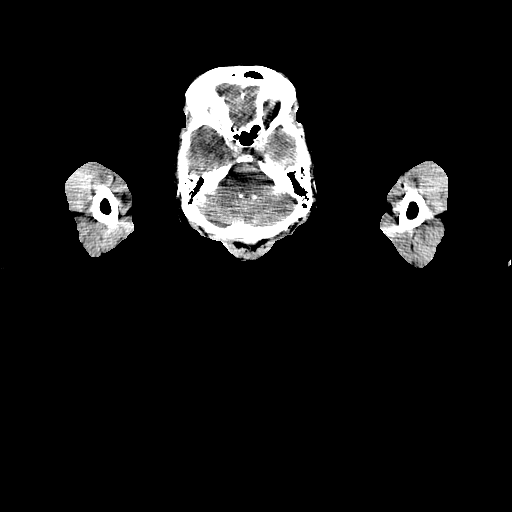

[Series 123: mip · coronal · 3.3mm · 4.69mm/px · 1 of 30 slices shown]
[im 1/30]
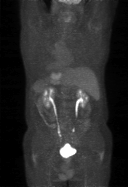

[25 of 25 positions shown; findings below may reference images not displayed]

FINDINGS: Increased radiotracer uptake at the level of the larynx likely reflects phonation during the uptake phase of the examination.  Clinical correlation and attention on follow-up exam recommended. 
There are no hypermetabolic lymph nodes within the soft tissues of the neck. 
There is mild low level FDG uptake within the left axillary lymph node (image #47).  The SUV max is equal to 1.5.
No hypermetabolic mediastinal or hilar lymph nodes noted.  There is no pleural or pericardial effusion. 
There is a subpleural nodule within the left upper lobe.  This nodule is too small to reliably characterize by PET.  Attention on follow-up examination is recommended. (image #59 of the current study).  
No additional suspicious pulmonary nodules or masses are noted.
There is a hypermetabolic mass situated in the left lobe of the liver.  The mass measures approximately 4.1 x 6.2 cm.
There is increased intense FDG uptake within this mass with an SUV max equal to 5.8.
The spleen is negative.  The adrenal glands are negative.  The pancreas is negative.  The left kidney is negative.  The right kidney has a low-density exophytic lesion arising from the interpolar cortex, which is not demonstrating any abnormal increased FDG uptake, but it is incompletely characterized without IV contrast.  There is a stone in the upper pole collecting system of the right kidney. 
There is no hypermetabolic retroperitoneal or small bowel mesenteric lymph node.  There is a lymph node within the left external iliac lymph node chain, which measures 11.9 x 18.2 mm.  This exhibits mild increased FDG uptake.  The SUV max is equal to 1.7.
IMPRESSION: 1.  Mass in left lobe of liver is hypermetabolic, consistent with tumor metastasis. 
2.  Mild nonspecific increased uptake is seen within the left external iliac lymph node.
3.  leural nodule in the left upper lobe, too small to characterize.  Attention on follow-up exam is recommended.

## 2007-08-09 IMAGING — US IR US GUIDE VASC ACCESS RIGHT
1 series · 1 of 1 positions shown · non-contrast
Comparison: none

CLINICAL DATA: Rectal   carcinoma. In need of IV access.

TUNNELED PORT CATHETER PLACEMENT WITH ULTRASOUND AND FLUOROSCOPIC GUIDANCE :
TECHNIQUE: Right neck and chest region prepped with chlorhexidine, draped in
usual sterile fashion, and infiltrated locally with 1% lidocaine. Intravenous
fentanyl and Versed were administered as conscious sedation during continuous
cardiorespiratory monitoring by the radiology RN. Patency of the right IJ vein
was confirmed under ultrasound. Under real-time ultrasound guidance, the right
IJ vein was accessed with a 21 gauge micropuncture needle. Needle was exchanged
over a 018 guidewire for transitional dilator which allowed passage of the
Benson wire into the IVC. Over this, the transitional dilator was exchanged for
a 5 French MPA catheter. A small incision was made on the right anterior chest
wall and a subcutaneous pocket fashioned. The port catheter was positioned and
its catheter tunneled to the right IJ dermatotomy site. The MPA catheter was
exchanged over an Amplatz wire for a peel-away sheath, through which the port
catheter, which had been trimmed to the appropriate length, was advanced and
positioned with its tip at the cavoatrial junction. Spot chest radiograph 
confirms good catheter position and no pneumothorax. The pocket was closed with
deep interrupted and subcuticular continuous 3-0 Monocryl sutures. The port was 
flushed per protocol. The incisions were covered with Dermabond then covered
with a sterile dressing. No immediate complication.

[Series 1: sp us guide vasc access*right* · 1 of 1 slices shown]
[im 1/1]
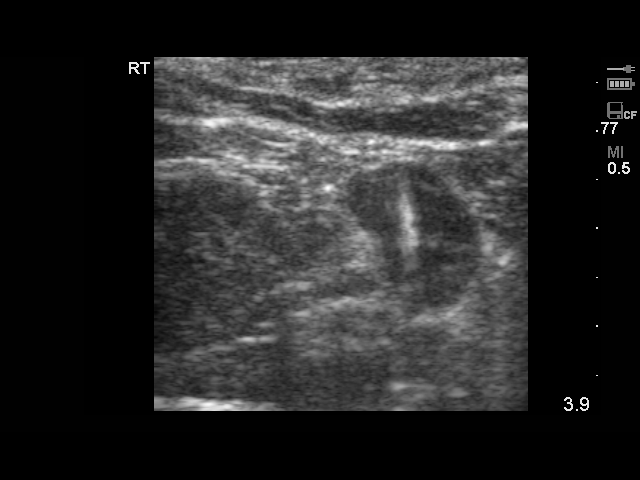

[1 of 1 positions shown; findings below may reference images not displayed]

IMPRESSION: Technically successful right IJ port catheter placement. Ready for routine use.

## 2007-09-15 ENCOUNTER — Ambulatory Visit: Payer: Self-pay | Admitting: Oncology

## 2007-10-15 ENCOUNTER — Encounter: Payer: Self-pay | Admitting: Internal Medicine

## 2007-10-15 LAB — COMPREHENSIVE METABOLIC PANEL
BUN: 27 mg/dL — ABNORMAL HIGH (ref 6–23)
CO2: 23 mEq/L (ref 19–32)
Glucose, Bld: 107 mg/dL — ABNORMAL HIGH (ref 70–99)
Sodium: 134 mEq/L — ABNORMAL LOW (ref 135–145)
Total Bilirubin: 0.9 mg/dL (ref 0.3–1.2)
Total Protein: 7.3 g/dL (ref 6.0–8.3)

## 2007-10-15 LAB — CBC WITH DIFFERENTIAL/PLATELET
Basophils Absolute: 0 10*3/uL (ref 0.0–0.1)
Eosinophils Absolute: 0.1 10*3/uL (ref 0.0–0.5)
HCT: 37.8 % — ABNORMAL LOW (ref 38.7–49.9)
HGB: 13.5 g/dL (ref 13.0–17.1)
LYMPH%: 23.2 % (ref 14.0–48.0)
MCV: 88.6 fL (ref 81.6–98.0)
MONO#: 0.6 10*3/uL (ref 0.1–0.9)
MONO%: 7.8 % (ref 0.0–13.0)
NEUT#: 5.1 10*3/uL (ref 1.5–6.5)
NEUT%: 67.5 % (ref 40.0–75.0)
Platelets: 233 10*3/uL (ref 145–400)
RBC: 4.27 10*6/uL (ref 4.20–5.71)
WBC: 7.6 10*3/uL (ref 4.0–10.0)

## 2007-10-15 LAB — LACTATE DEHYDROGENASE: LDH: 131 U/L (ref 94–250)

## 2007-11-05 ENCOUNTER — Ambulatory Visit: Payer: Self-pay | Admitting: Gastroenterology

## 2007-11-20 ENCOUNTER — Ambulatory Visit: Payer: Self-pay | Admitting: Gastroenterology

## 2007-12-11 ENCOUNTER — Ambulatory Visit: Payer: Self-pay | Admitting: Oncology

## 2007-12-15 ENCOUNTER — Encounter: Payer: Self-pay | Admitting: Internal Medicine

## 2007-12-15 LAB — CBC WITH DIFFERENTIAL/PLATELET
BASO%: 0.5 % (ref 0.0–2.0)
Eosinophils Absolute: 0.1 10*3/uL (ref 0.0–0.5)
LYMPH%: 24.7 % (ref 14.0–48.0)
MCHC: 35 g/dL (ref 32.0–35.9)
MCV: 89.5 fL (ref 81.6–98.0)
MONO%: 9.5 % (ref 0.0–13.0)
NEUT%: 63.7 % (ref 40.0–75.0)
Platelets: 246 10*3/uL (ref 145–400)
RBC: 4.38 10*6/uL (ref 4.20–5.71)

## 2007-12-15 LAB — COMPREHENSIVE METABOLIC PANEL
Alkaline Phosphatase: 53 U/L (ref 39–117)
Creatinine, Ser: 1.03 mg/dL (ref 0.40–1.50)
Glucose, Bld: 76 mg/dL (ref 70–99)
Sodium: 138 mEq/L (ref 135–145)
Total Bilirubin: 0.5 mg/dL (ref 0.3–1.2)
Total Protein: 7.2 g/dL (ref 6.0–8.3)

## 2007-12-15 LAB — CEA: CEA: 0.9 ng/mL (ref 0.0–5.0)

## 2008-02-11 ENCOUNTER — Ambulatory Visit: Payer: Self-pay | Admitting: Oncology

## 2008-02-15 ENCOUNTER — Encounter: Payer: Self-pay | Admitting: Internal Medicine

## 2008-02-15 LAB — CBC WITH DIFFERENTIAL/PLATELET
BASO%: 0.4 % (ref 0.0–2.0)
EOS%: 2.3 % (ref 0.0–7.0)
MCH: 30.5 pg (ref 28.0–33.4)
MCHC: 35 g/dL (ref 32.0–35.9)
MCV: 87.4 fL (ref 81.6–98.0)
MONO%: 8.7 % (ref 0.0–13.0)
NEUT%: 57.9 % (ref 40.0–75.0)
RDW: 12.6 % (ref 11.2–14.6)
lymph#: 1.9 10*3/uL (ref 0.9–3.3)

## 2008-02-15 LAB — COMPREHENSIVE METABOLIC PANEL
ALT: 16 U/L (ref 0–53)
AST: 17 U/L (ref 0–37)
Alkaline Phosphatase: 44 U/L (ref 39–117)
Calcium: 8.9 mg/dL (ref 8.4–10.5)
Chloride: 105 mEq/L (ref 96–112)
Creatinine, Ser: 0.98 mg/dL (ref 0.40–1.50)
Potassium: 3.6 mEq/L (ref 3.5–5.3)

## 2008-02-15 LAB — LACTATE DEHYDROGENASE: LDH: 123 U/L (ref 94–250)

## 2008-02-15 LAB — CEA: CEA: 1 ng/mL (ref 0.0–5.0)

## 2008-04-11 ENCOUNTER — Encounter: Payer: Self-pay | Admitting: Internal Medicine

## 2008-04-13 ENCOUNTER — Ambulatory Visit: Payer: Self-pay | Admitting: Oncology

## 2008-04-15 ENCOUNTER — Encounter: Payer: Self-pay | Admitting: Internal Medicine

## 2008-04-15 LAB — COMPREHENSIVE METABOLIC PANEL
ALT: 16 U/L (ref 0–53)
Albumin: 4.1 g/dL (ref 3.5–5.2)
CO2: 24 mEq/L (ref 19–32)
Chloride: 103 mEq/L (ref 96–112)
Glucose, Bld: 161 mg/dL — ABNORMAL HIGH (ref 70–99)
Potassium: 3.6 mEq/L (ref 3.5–5.3)
Sodium: 138 mEq/L (ref 135–145)
Total Protein: 7 g/dL (ref 6.0–8.3)

## 2008-04-15 LAB — CBC WITH DIFFERENTIAL/PLATELET
Eosinophils Absolute: 0.1 10*3/uL (ref 0.0–0.5)
LYMPH%: 29.3 % (ref 14.0–48.0)
MONO#: 0.4 10*3/uL (ref 0.1–0.9)
NEUT#: 4 10*3/uL (ref 1.5–6.5)
Platelets: 221 10*3/uL (ref 145–400)
RBC: 4.65 10*6/uL (ref 4.20–5.71)
RDW: 12.1 % (ref 11.2–14.6)
WBC: 6.4 10*3/uL (ref 4.0–10.0)

## 2008-04-15 LAB — LACTATE DEHYDROGENASE: LDH: 126 U/L (ref 94–250)

## 2008-04-15 LAB — CEA: CEA: 1.2 ng/mL (ref 0.0–5.0)

## 2008-04-18 ENCOUNTER — Ambulatory Visit: Payer: Self-pay | Admitting: Internal Medicine

## 2008-04-18 DIAGNOSIS — F3289 Other specified depressive episodes: Secondary | ICD-10-CM

## 2008-04-18 DIAGNOSIS — J309 Allergic rhinitis, unspecified: Secondary | ICD-10-CM | POA: Insufficient documentation

## 2008-04-18 DIAGNOSIS — Z8619 Personal history of other infectious and parasitic diseases: Secondary | ICD-10-CM

## 2008-04-18 DIAGNOSIS — I10 Essential (primary) hypertension: Secondary | ICD-10-CM

## 2008-04-18 DIAGNOSIS — F329 Major depressive disorder, single episode, unspecified: Secondary | ICD-10-CM

## 2008-04-18 DIAGNOSIS — E739 Lactose intolerance, unspecified: Secondary | ICD-10-CM | POA: Insufficient documentation

## 2008-04-18 DIAGNOSIS — M545 Low back pain, unspecified: Secondary | ICD-10-CM | POA: Insufficient documentation

## 2008-04-18 DIAGNOSIS — E785 Hyperlipidemia, unspecified: Secondary | ICD-10-CM

## 2008-04-18 DIAGNOSIS — F32A Depression, unspecified: Secondary | ICD-10-CM | POA: Insufficient documentation

## 2008-04-18 DIAGNOSIS — Z85038 Personal history of other malignant neoplasm of large intestine: Secondary | ICD-10-CM

## 2008-04-18 DIAGNOSIS — R5383 Other fatigue: Secondary | ICD-10-CM

## 2008-04-18 DIAGNOSIS — K219 Gastro-esophageal reflux disease without esophagitis: Secondary | ICD-10-CM

## 2008-04-18 DIAGNOSIS — R5381 Other malaise: Secondary | ICD-10-CM | POA: Insufficient documentation

## 2008-04-18 HISTORY — DX: Gastro-esophageal reflux disease without esophagitis: K21.9

## 2008-04-18 HISTORY — DX: Hyperlipidemia, unspecified: E78.5

## 2008-04-18 HISTORY — DX: Major depressive disorder, single episode, unspecified: F32.9

## 2008-04-18 HISTORY — DX: Personal history of other malignant neoplasm of large intestine: Z85.038

## 2008-04-18 HISTORY — DX: Other specified depressive episodes: F32.89

## 2008-04-18 HISTORY — DX: Essential (primary) hypertension: I10

## 2008-04-18 HISTORY — DX: Personal history of other infectious and parasitic diseases: Z86.19

## 2008-04-18 HISTORY — DX: Allergic rhinitis, unspecified: J30.9

## 2008-04-19 LAB — CONVERTED CEMR LAB
ALT: 20 units/L (ref 0–53)
Albumin: 3.7 g/dL (ref 3.5–5.2)
BUN: 15 mg/dL (ref 6–23)
Basophils Relative: 0.7 % (ref 0.0–1.0)
Bilirubin, Direct: 0.1 mg/dL (ref 0.0–0.3)
CO2: 30 meq/L (ref 19–32)
Calcium: 9.1 mg/dL (ref 8.4–10.5)
Cholesterol: 225 mg/dL (ref 0–200)
Creatinine, Ser: 1 mg/dL (ref 0.4–1.5)
Direct LDL: 110.4 mg/dL
GFR calc Af Amer: 100 mL/min
Glucose, Bld: 86 mg/dL (ref 70–99)
HCT: 38.3 % — ABNORMAL LOW (ref 39.0–52.0)
Hemoglobin: 13.3 g/dL (ref 13.0–17.0)
Lymphocytes Relative: 29.5 % (ref 12.0–46.0)
Monocytes Absolute: 0.6 10*3/uL (ref 0.1–1.0)
Monocytes Relative: 9.2 % (ref 3.0–12.0)
Neutro Abs: 3.9 10*3/uL (ref 1.4–7.7)
PSA: 0.18 ng/mL (ref 0.10–4.00)
RBC: 4.29 M/uL (ref 4.22–5.81)
Total CHOL/HDL Ratio: 6.3
Total Protein: 6.6 g/dL (ref 6.0–8.3)
VLDL: 70 mg/dL — ABNORMAL HIGH (ref 0–40)

## 2008-06-13 ENCOUNTER — Ambulatory Visit: Payer: Self-pay | Admitting: Oncology

## 2008-06-15 ENCOUNTER — Encounter: Payer: Self-pay | Admitting: Gastroenterology

## 2008-06-15 LAB — COMPREHENSIVE METABOLIC PANEL
AST: 19 U/L (ref 0–37)
BUN: 23 mg/dL (ref 6–23)
Calcium: 9 mg/dL (ref 8.4–10.5)
Chloride: 104 mEq/L (ref 96–112)
Creatinine, Ser: 1.22 mg/dL (ref 0.40–1.50)

## 2008-06-15 LAB — CBC WITH DIFFERENTIAL/PLATELET
Basophils Absolute: 0 10*3/uL (ref 0.0–0.1)
EOS%: 2.3 % (ref 0.0–7.0)
HCT: 37.1 % — ABNORMAL LOW (ref 38.7–49.9)
HGB: 13.1 g/dL (ref 13.0–17.1)
LYMPH%: 33.9 % (ref 14.0–48.0)
MCH: 30.7 pg (ref 28.0–33.4)
MCV: 87.1 fL (ref 81.6–98.0)
MONO%: 9.5 % (ref 0.0–13.0)
NEUT%: 53.8 % (ref 40.0–75.0)
Platelets: 237 10*3/uL (ref 145–400)
lymph#: 2.1 10*3/uL (ref 0.9–3.3)

## 2008-08-05 ENCOUNTER — Ambulatory Visit: Payer: Self-pay | Admitting: Oncology

## 2008-09-23 ENCOUNTER — Ambulatory Visit: Payer: Self-pay | Admitting: Oncology

## 2008-09-27 ENCOUNTER — Encounter: Payer: Self-pay | Admitting: Internal Medicine

## 2008-09-27 LAB — COMPREHENSIVE METABOLIC PANEL
ALT: 28 U/L (ref 0–53)
AST: 22 U/L (ref 0–37)
Albumin: 4.4 g/dL (ref 3.5–5.2)
CO2: 25 mEq/L (ref 19–32)
Calcium: 9.7 mg/dL (ref 8.4–10.5)
Chloride: 101 mEq/L (ref 96–112)
Potassium: 3.6 mEq/L (ref 3.5–5.3)

## 2008-09-27 LAB — CBC WITH DIFFERENTIAL/PLATELET
BASO%: 0.4 % (ref 0.0–2.0)
Basophils Absolute: 0 10*3/uL (ref 0.0–0.1)
EOS%: 2 % (ref 0.0–7.0)
HGB: 14.1 g/dL (ref 13.0–17.1)
MCH: 31.5 pg (ref 28.0–33.4)
MCHC: 35 g/dL (ref 32.0–35.9)
MONO#: 0.5 10*3/uL (ref 0.1–0.9)
RDW: 12.2 % (ref 11.2–14.6)
WBC: 5.4 10*3/uL (ref 4.0–10.0)
lymph#: 1.9 10*3/uL (ref 0.9–3.3)

## 2008-10-14 ENCOUNTER — Ambulatory Visit (HOSPITAL_COMMUNITY): Admission: RE | Admit: 2008-10-14 | Discharge: 2008-10-14 | Payer: Self-pay | Admitting: Oncology

## 2008-11-28 ENCOUNTER — Ambulatory Visit: Payer: Self-pay | Admitting: Oncology

## 2009-01-26 ENCOUNTER — Ambulatory Visit: Payer: Self-pay | Admitting: Oncology

## 2009-01-30 ENCOUNTER — Encounter: Payer: Self-pay | Admitting: Internal Medicine

## 2009-01-30 ENCOUNTER — Encounter: Payer: Self-pay | Admitting: Gastroenterology

## 2009-01-30 LAB — CBC WITH DIFFERENTIAL/PLATELET
Basophils Absolute: 0 10*3/uL (ref 0.0–0.1)
EOS%: 2.4 % (ref 0.0–7.0)
HGB: 14.7 g/dL (ref 13.0–17.1)
LYMPH%: 35 % (ref 14.0–48.0)
MCH: 31.6 pg (ref 28.0–33.4)
MCV: 89.3 fL (ref 81.6–98.0)
MONO%: 9.1 % (ref 0.0–13.0)
Platelets: 250 10*3/uL (ref 145–400)
RBC: 4.65 10*6/uL (ref 4.20–5.71)
RDW: 12.3 % (ref 11.2–14.6)

## 2009-01-30 LAB — COMPREHENSIVE METABOLIC PANEL
AST: 17 U/L (ref 0–37)
Albumin: 4.2 g/dL (ref 3.5–5.2)
Alkaline Phosphatase: 55 U/L (ref 39–117)
BUN: 19 mg/dL (ref 6–23)
Glucose, Bld: 138 mg/dL — ABNORMAL HIGH (ref 70–99)
Potassium: 3.8 mEq/L (ref 3.5–5.3)
Total Bilirubin: 0.5 mg/dL (ref 0.3–1.2)

## 2009-01-30 LAB — CEA: CEA: 0.8 ng/mL (ref 0.0–5.0)

## 2009-01-30 LAB — LACTATE DEHYDROGENASE: LDH: 107 U/L (ref 94–250)

## 2009-03-23 ENCOUNTER — Ambulatory Visit: Payer: Self-pay | Admitting: Oncology

## 2009-05-18 ENCOUNTER — Ambulatory Visit: Payer: Self-pay | Admitting: Oncology

## 2009-05-22 ENCOUNTER — Encounter: Payer: Self-pay | Admitting: Internal Medicine

## 2009-05-22 LAB — CBC WITH DIFFERENTIAL/PLATELET
BASO%: 0.3 % (ref 0.0–2.0)
HCT: 43.4 % (ref 38.4–49.9)
MCHC: 34.9 g/dL (ref 32.0–36.0)
MONO#: 0.6 10*3/uL (ref 0.1–0.9)
RBC: 4.85 10*6/uL (ref 4.20–5.82)
RDW: 12 % (ref 11.0–14.6)
WBC: 7.5 10*3/uL (ref 4.0–10.3)
lymph#: 2.6 10*3/uL (ref 0.9–3.3)

## 2009-05-22 LAB — COMPREHENSIVE METABOLIC PANEL
ALT: 19 U/L (ref 0–53)
AST: 18 U/L (ref 0–37)
CO2: 25 mEq/L (ref 19–32)
Calcium: 9.4 mg/dL (ref 8.4–10.5)
Chloride: 100 mEq/L (ref 96–112)
Sodium: 137 mEq/L (ref 135–145)
Total Protein: 7.1 g/dL (ref 6.0–8.3)

## 2009-05-22 LAB — LACTATE DEHYDROGENASE: LDH: 107 U/L (ref 94–250)

## 2009-07-19 ENCOUNTER — Ambulatory Visit: Payer: Self-pay | Admitting: Oncology

## 2009-08-01 ENCOUNTER — Telehealth: Payer: Self-pay | Admitting: Internal Medicine

## 2009-08-24 ENCOUNTER — Ambulatory Visit: Payer: Self-pay | Admitting: Internal Medicine

## 2009-08-24 LAB — CONVERTED CEMR LAB
AST: 19 units/L (ref 0–37)
BUN: 21 mg/dL (ref 6–23)
Basophils Absolute: 0.1 10*3/uL (ref 0.0–0.1)
Bilirubin Urine: NEGATIVE
Creatinine, Ser: 1 mg/dL (ref 0.4–1.5)
GFR calc non Af Amer: 81.9 mL/min (ref >60)
Glucose, Bld: 104 mg/dL — ABNORMAL HIGH (ref 70–99)
HCT: 42.4 % (ref 39.0–52.0)
HDL: 35.5 mg/dL — ABNORMAL LOW (ref >39.00)
Hemoglobin, Urine: NEGATIVE
Lymphs Abs: 2.2 10*3/uL (ref 0.7–4.0)
Monocytes Absolute: 0.7 10*3/uL (ref 0.1–1.0)
Monocytes Relative: 9.3 % (ref 3.0–12.0)
Neutrophils Relative %: 55.5 % (ref 43.0–77.0)
PSA: 0.32 ng/mL (ref 0.10–4.00)
Platelets: 232 10*3/uL (ref 150.0–400.0)
Potassium: 3.7 meq/L (ref 3.5–5.1)
RDW: 12.1 % (ref 11.5–14.6)
TSH: 1.2 microintl units/mL (ref 0.35–5.50)
Total Bilirubin: 0.9 mg/dL (ref 0.3–1.2)
Total Protein, Urine: NEGATIVE mg/dL
Triglycerides: 350 mg/dL — ABNORMAL HIGH (ref 0.0–149.0)
Urine Glucose: NEGATIVE mg/dL

## 2009-09-19 ENCOUNTER — Ambulatory Visit: Payer: Self-pay | Admitting: Oncology

## 2009-09-19 ENCOUNTER — Encounter: Payer: Self-pay | Admitting: Internal Medicine

## 2009-09-19 LAB — CBC WITH DIFFERENTIAL/PLATELET
BASO%: 0.6 % (ref 0.0–2.0)
Eosinophils Absolute: 0.2 10*3/uL (ref 0.0–0.5)
HCT: 43 % (ref 38.4–49.9)
LYMPH%: 44.3 % (ref 14.0–49.0)
MCHC: 34.4 g/dL (ref 32.0–36.0)
MONO#: 0.6 10*3/uL (ref 0.1–0.9)
NEUT#: 3 10*3/uL (ref 1.5–6.5)
Platelets: 189 10*3/uL (ref 140–400)
RBC: 4.8 10*6/uL (ref 4.20–5.82)
WBC: 6.7 10*3/uL (ref 4.0–10.3)
lymph#: 3 10*3/uL (ref 0.9–3.3)

## 2009-09-19 LAB — COMPREHENSIVE METABOLIC PANEL
ALT: 27 U/L (ref 0–53)
AST: 23 U/L (ref 0–37)
Albumin: 4.1 g/dL (ref 3.5–5.2)
CO2: 23 mEq/L (ref 19–32)
Calcium: 8.8 mg/dL (ref 8.4–10.5)
Chloride: 103 mEq/L (ref 96–112)
Creatinine, Ser: 1.1 mg/dL (ref 0.40–1.50)
Potassium: 3.7 mEq/L (ref 3.5–5.3)
Total Protein: 7 g/dL (ref 6.0–8.3)

## 2009-09-19 LAB — LACTATE DEHYDROGENASE: LDH: 133 U/L (ref 94–250)

## 2009-09-19 LAB — CEA: CEA: 0.9 ng/mL (ref 0.0–5.0)

## 2009-11-14 ENCOUNTER — Ambulatory Visit: Payer: Self-pay | Admitting: Oncology

## 2010-01-12 ENCOUNTER — Ambulatory Visit: Payer: Self-pay | Admitting: Oncology

## 2010-01-16 ENCOUNTER — Encounter: Payer: Self-pay | Admitting: Internal Medicine

## 2010-01-16 LAB — COMPREHENSIVE METABOLIC PANEL
ALT: 20 U/L (ref 0–53)
Albumin: 4.3 g/dL (ref 3.5–5.2)
CO2: 25 mEq/L (ref 19–32)
Calcium: 9.1 mg/dL (ref 8.4–10.5)
Chloride: 101 mEq/L (ref 96–112)
Glucose, Bld: 133 mg/dL — ABNORMAL HIGH (ref 70–99)
Potassium: 4 mEq/L (ref 3.5–5.3)
Sodium: 137 mEq/L (ref 135–145)
Total Bilirubin: 0.6 mg/dL (ref 0.3–1.2)
Total Protein: 6.9 g/dL (ref 6.0–8.3)

## 2010-01-16 LAB — CBC WITH DIFFERENTIAL/PLATELET
BASO%: 0.6 % (ref 0.0–2.0)
Eosinophils Absolute: 0.1 10*3/uL (ref 0.0–0.5)
HCT: 44.9 % (ref 38.4–49.9)
LYMPH%: 35.9 % (ref 14.0–49.0)
MCHC: 35.5 g/dL (ref 32.0–36.0)
MONO#: 0.7 10*3/uL (ref 0.1–0.9)
NEUT#: 4.4 10*3/uL (ref 1.5–6.5)
Platelets: 249 10*3/uL (ref 140–400)
RBC: 4.97 10*6/uL (ref 4.20–5.82)
WBC: 8.3 10*3/uL (ref 4.0–10.3)
lymph#: 3 10*3/uL (ref 0.9–3.3)

## 2010-01-16 LAB — LACTATE DEHYDROGENASE: LDH: 122 U/L (ref 94–250)

## 2010-01-16 LAB — CEA: CEA: 0.6 ng/mL (ref 0.0–5.0)

## 2010-01-31 ENCOUNTER — Encounter: Payer: Self-pay | Admitting: Internal Medicine

## 2010-02-08 ENCOUNTER — Encounter: Payer: Self-pay | Admitting: Internal Medicine

## 2010-02-20 ENCOUNTER — Ambulatory Visit (HOSPITAL_COMMUNITY): Admission: RE | Admit: 2010-02-20 | Discharge: 2010-02-20 | Payer: Self-pay | Admitting: Diagnostic Radiology

## 2010-02-22 ENCOUNTER — Ambulatory Visit (HOSPITAL_COMMUNITY): Admission: RE | Admit: 2010-02-22 | Discharge: 2010-02-22 | Payer: Self-pay | Admitting: Oncology

## 2010-04-30 ENCOUNTER — Ambulatory Visit: Payer: Self-pay | Admitting: Oncology

## 2010-05-01 ENCOUNTER — Encounter: Payer: Self-pay | Admitting: Internal Medicine

## 2010-05-01 LAB — CBC WITH DIFFERENTIAL/PLATELET
Basophils Absolute: 0 10*3/uL (ref 0.0–0.1)
EOS%: 2.1 % (ref 0.0–7.0)
Eosinophils Absolute: 0.2 10*3/uL (ref 0.0–0.5)
HCT: 43.2 % (ref 38.4–49.9)
HGB: 15.3 g/dL (ref 13.0–17.1)
MCH: 31.9 pg (ref 27.2–33.4)
MCV: 89.9 fL (ref 79.3–98.0)
MONO%: 9.2 % (ref 0.0–14.0)
NEUT#: 4.2 10*3/uL (ref 1.5–6.5)
NEUT%: 55.5 % (ref 39.0–75.0)
Platelets: 253 10*3/uL (ref 140–400)

## 2010-05-01 LAB — COMPREHENSIVE METABOLIC PANEL
Albumin: 4.2 g/dL (ref 3.5–5.2)
Alkaline Phosphatase: 52 U/L (ref 39–117)
BUN: 13 mg/dL (ref 6–23)
CO2: 24 mEq/L (ref 19–32)
Glucose, Bld: 111 mg/dL — ABNORMAL HIGH (ref 70–99)
Total Bilirubin: 0.3 mg/dL (ref 0.3–1.2)

## 2010-05-01 LAB — CEA: CEA: 1 ng/mL (ref 0.0–5.0)

## 2010-09-17 ENCOUNTER — Ambulatory Visit: Payer: Self-pay | Admitting: Internal Medicine

## 2010-09-17 DIAGNOSIS — G4710 Hypersomnia, unspecified: Secondary | ICD-10-CM | POA: Insufficient documentation

## 2010-09-18 LAB — CONVERTED CEMR LAB
Albumin: 4.3 g/dL (ref 3.5–5.2)
BUN: 13 mg/dL (ref 6–23)
Bilirubin, Direct: 0.1 mg/dL (ref 0.0–0.3)
Chloride: 99 meq/L (ref 96–112)
Cholesterol: 240 mg/dL — ABNORMAL HIGH (ref 0–200)
Eosinophils Relative: 1.8 % (ref 0.0–5.0)
Folate: 7.5 ng/mL
Glucose, Bld: 100 mg/dL — ABNORMAL HIGH (ref 70–99)
HCT: 43.2 % (ref 39.0–52.0)
Hemoglobin, Urine: NEGATIVE
Iron: 126 ug/dL (ref 42–165)
Lymphs Abs: 2.9 10*3/uL (ref 0.7–4.0)
MCV: 92.1 fL (ref 78.0–100.0)
Monocytes Absolute: 0.9 10*3/uL (ref 0.1–1.0)
PSA: 0.35 ng/mL (ref 0.10–4.00)
Platelets: 300 10*3/uL (ref 150.0–400.0)
Potassium: 3.7 meq/L (ref 3.5–5.1)
RDW: 13.1 % (ref 11.5–14.6)
TSH: 1.17 microintl units/mL (ref 0.35–5.50)
Total Protein, Urine: NEGATIVE mg/dL
Total Protein: 7.4 g/dL (ref 6.0–8.3)
Transferrin: 237.5 mg/dL (ref 212.0–360.0)
Triglycerides: 349 mg/dL — ABNORMAL HIGH (ref 0.0–149.0)
Urine Glucose: NEGATIVE mg/dL
Vitamin B-12: 270 pg/mL (ref 211–911)
pH: 5.5 (ref 5.0–8.0)

## 2010-10-25 ENCOUNTER — Ambulatory Visit: Payer: Self-pay | Admitting: Internal Medicine

## 2010-10-30 ENCOUNTER — Ambulatory Visit: Payer: Self-pay | Admitting: Oncology

## 2011-01-06 ENCOUNTER — Encounter (HOSPITAL_COMMUNITY): Payer: Self-pay | Admitting: Oncology

## 2011-01-07 ENCOUNTER — Encounter (HOSPITAL_COMMUNITY): Payer: Self-pay | Admitting: Oncology

## 2011-01-11 ENCOUNTER — Encounter (INDEPENDENT_AMBULATORY_CARE_PROVIDER_SITE_OTHER): Payer: Self-pay | Admitting: *Deleted

## 2011-01-14 ENCOUNTER — Ambulatory Visit
Admission: RE | Admit: 2011-01-14 | Discharge: 2011-01-14 | Payer: Self-pay | Source: Home / Self Care | Attending: Gastroenterology | Admitting: Gastroenterology

## 2011-01-15 NOTE — Letter (Signed)
[facility]   Imported By: Lester Riverdale 01/23/2010 10:23:40  _____________________________________________________________________  External Attachment:    Type:   Image     Comment:   External Document

## 2011-01-15 NOTE — Letter (Signed)
[facility]   Imported By: Sherian Rein 05/09/2010 15:07:50  _____________________________________________________________________  External Attachment:    Type:   Image     Comment:   External Document

## 2011-01-15 NOTE — Medication Information (Signed)
Summary: [facility]  [facility]   Imported By: Lester Murray 02/12/2010 08:01:18  _____________________________________________________________________  External Attachment:    Type:   Image     Comment:   External Document

## 2011-01-15 NOTE — Assessment & Plan Note (Signed)
Summary: form to be completed-lb   Vital Signs:  Patient profile:   60 year old male Height:      74 inches Weight:      233.50 pounds BMI:     30.09 O2 Sat:      97 % on Room air Temp:     97.6 degrees F oral Pulse rate:   79 / minute BP sitting:   134 / 80  (left arm) Cuff size:   large  Vitals Entered By: Zella Ball Ewing CMA (AAMA) (October 25, 2010 2:02 PM)  O2 Flow:  Room air  CC: Form to be completed, refill/RE   CC:  Form to be completed and refill/RE.  History of Present Illness: here to f/u; overall doing OK, Pt denies CP, worsening sob, doe, wheezing, orthopnea, pnd, worsening LE edema, palps, dizziness or syncope  Pt denies new neuro symptoms such as headache, facial or extremity weakness  Pt denies polydipsia, polyuria. Has ongoing severe sacral pain per pt due to neural complications post surgury;  sees pain management;  presents with a disability form to be filled out as he needs to have done apparently to retain his longterm disability pay status.  No fever, wt loss, night sweats, loss of appetite or other constitutional symptoms  Has ongoing frustration over pain, depressive symtpoms but Denies  suicidal ideation, or panic.  Pain and overall impariment not change - see form filled out, has not tried cymbalta  Problems Prior to Update: 1)  Preventive Health Care  (ICD-V70.0) 2)  Fatigue  (ICD-780.79) 3)  Hypersomnia  (ICD-780.54) 4)  Allergic Rhinitis  (ICD-477.9) 5)  Glucose Intolerance  (ICD-271.3) 6)  Depression  (ICD-311) 7)  Hyperlipidemia  (ICD-272.4) 8)  Special Screening Malignant Neoplasm of Prostate  (ICD-V76.44) 9)  Fatigue  (ICD-780.79) 10)  Low Back Pain  (ICD-724.2) 11)  Hypertension  (ICD-401.9) 12)  Hepatitis B, Hx of  (ICD-V12.09) 13)  Gerd  (ICD-530.81) 14)  Colon Cancer, Hx of  (ICD-V10.05)  Medications Prior to Update: 1)  Azor 5-40 Mg Tabs (Amlodipine-Olmesartan) .Marland Kitchen.. 1po Once Daily 2)  Remeron 15 Mg  Tabs (Mirtazapine) .Marland Kitchen.. 1 Tab Q Hs  Prn 3)  Methadone Hcl 5 Mg  Tabs (Methadone Hcl) .... 6 Tabs Qd 4)  Xalatan 0.005 %  Soln (Latanoprost) .... Use As Directed 5)  Proair Hfa 108 (90 Base) Mcg/act  Aers (Albuterol Sulfate) .... 2 Puff Qid As Needed 6)  Lovastatin 40 Mg Tabs (Lovastatin) .Marland Kitchen.. 1po Once Daily  Current Medications (verified): 1)  Azor 5-40 Mg Tabs (Amlodipine-Olmesartan) .Marland Kitchen.. 1po Once Daily 2)  Remeron 15 Mg  Tabs (Mirtazapine) .Marland Kitchen.. 1 Tab Q Hs Prn 3)  Methadone Hcl 5 Mg  Tabs (Methadone Hcl) .... 6 Tabs Qd 4)  Xalatan 0.005 %  Soln (Latanoprost) .... Use As Directed 5)  Proair Hfa 108 (90 Base) Mcg/act  Aers (Albuterol Sulfate) .... 2 Puff Qid As Needed 6)  Lovastatin 40 Mg Tabs (Lovastatin) .Marland Kitchen.. 1po Once Daily  Allergies (verified): No Known Drug Allergies  Past History:  Past Medical History: Last updated: 09/17/2010 rectal cancer - adenocarcinoma GERD Hepatitis B, hx of glaucoma Hypertension Low back pain - chronic - pain clinic Hyperlipidemia Depression glucose intolerance  MD roster: Dr Jola Babinski -surgical oncologist  - [facility]                  Dr Arline Asp - oncologist  Dr Vear Clock - pain                   Dr Nile Riggs - optho                   Derm - Dr Elliot Gurney                  GI - Dr Arlyce Dice  Past Surgical History: Last updated: 04/18/2008 Appendectomy Rotator cuff repair Tonsillectomy repair tendon - hand s/p rectal surgury - 2003 - AP resection s/p partial liver surgury due to metastasis/ then CMT  Social History: Last updated: 09/17/2010 Married 3 children Former Smoker 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   Head:  normocephalic and atraumatic.   Eyes:  vision grossly intact, pupils equal, and pupils round.   Ears:  R ear normal and L ear normal.   Nose:  no  external deformity and no nasal discharge.   Mouth:  no gingival abnormalities and pharynx pink and moist.   Neck:  supple and no masses.   Lungs:  normal respiratory effort and normal breath sounds.   Heart:  normal rate and regular rhythm.   Msk:  no joint tenderness and no joint swelling.  , spine with tender low lubmar/sacral area tender Extremities:  no edema, no erythema  Neurologic:  cranial nerves II-XII intact and strength normal in all extremities.   Psych:  dysphoric affect, moderately anxious, and agitated.Mardene Sayer   Impression & Recommendations:  Problem # 1:  HYPERTENSION (ICD-401.9)  His updated medication list for this problem includes:    Azor 5-40 Mg Tabs (Amlodipine-olmesartan) .Marland Kitchen... 1po once daily  BP today: 134/80 Prior BP: 158/88 (09/17/2010)  Labs Reviewed: K+: 3.7 (09/17/2010) Creat: : 1.2 (09/17/2010)   Chol: 240 (09/17/2010)   HDL: 38.40 (09/17/2010)   LDL: DEL (04/18/2008)   TG: 349.0 (09/17/2010) stable overall by hx and exam, ok to continue meds/tx as is   Problem # 2:  LOW BACK PAIN (ICD-724.2)  His updated medication list for this problem includes:    Methadone Hcl 5 Mg Tabs (Methadone hcl) .Marland KitchenMarland KitchenMarland KitchenMarland Kitchen 6 tabs qd form completed today, to cont pain mangaement, o/w stable overall by hx and exam, ok to continue meds/tx as is , encouraged pt to consider cymbalta with his pain management MD  Problem # 3:  DEPRESSION (ICD-311)  His updated medication list for this problem includes:    Remeron 15 Mg Tabs (Mirtazapine) .Marland Kitchen... 1 tab q hs prn stable overall by hx and exam, ok to continue meds/tx as is , might consider cymbalta as above which could help with depression and pain  Complete Medication List: 1)  Azor 5-40 Mg Tabs (Amlodipine-olmesartan) .Marland Kitchen.. 1po once daily 2)  Remeron 15 Mg Tabs (Mirtazapine) .Marland Kitchen.. 1 tab q hs prn 3)  Methadone Hcl 5 Mg Tabs (Methadone hcl) .... 6 tabs qd 4)  Xalatan 0.005 % Soln (Latanoprost) .... Use as directed 5)  Proair Hfa 108  (90 Base) Mcg/act Aers (Albuterol sulfate) .... 2 puff qid as needed 6)  Lovastatin 40 Mg Tabs (Lovastatin) .Marland Kitchen.. 1po once daily  Patient Instructions: 1)  your form was completed today 2)  Continue all previous medications as before this visit  3)  Please schedule a  follow-up appointment as needed. Prescriptions: PROAIR HFA 108 (90 BASE) MCG/ACT  AERS (ALBUTEROL SULFATE) 2 puff qid as needed  #3 x 3   Entered and Authorized by:   Corwin Levins MD   Signed by:   Corwin Levins MD on 10/25/2010   Method used:   [facility]        [facility]  [place] [address] [facility]retail[facility]       [address]       [place], [place]  [address]       Ph: [phone] or [phone]       Fax: [phone]   RxID:   [id number]    Orders Added: 1)  Est. Patient Level IV [27[address]]

## 2011-01-15 NOTE — Assessment & Plan Note (Signed)
Summary: elevated bp per aj-lb   Vital Signs:  Patient profile:   60 year old male Height:      74 inches Weight:      231.50 pounds BMI:     29.83 O2 Sat:      96 % on Room air Temp:     97.8 degrees F oral Pulse rate:   100 / minute BP sitting:   158 / 88  (left arm) Cuff size:   large  Vitals Entered By: Zella Ball Ewing CMA (AAMA) (September 17, 2010 11:18 AM)  O2 Flow:  Room air  CC: Elevated BP, Refills/RE/wellness   CC:  Elevated BP and Refills/RE/wellness.  History of Present Illness: here for wellness and f/u - overall doing well;  Pt denies CP, worsening sob, doe, wheezing, orthopnea, pnd, worsening LE edema, palps, dizziness or syncope  Pt denies new neuro symptoms such as headache, facial or extremity weakness  Pt denies polydipsia, polyuria.  Overall good compliance with meds, trying to follow low chol diet, wt stable, little excercise however.  No fever, wt loss, night sweats, loss of appetite or other constitutional symptoms   has ongoing stress related to his cancer illness, but no worsening depresive symptoms or suicidal dieation or panic.  Does have signficnat daytime somnolence, with night snoring adn nonrestorative sleep, as well significant daytime fatigue  Here for wellness Diet: Heart Healthy or DM if diabetic Physical Activities: Sedentary Depression/mood screen: Negative Hearing: Intact bilateral Visual Acuity: Grossly normal, gets exam yearly, wears reading glasses ADL's: Capable  Fall Risk: None Home Safety: Good Cognitive Impairment:  Gen appearance, affect, speech, memory, attention & motor skills grossly intact End-of-Life Planning: Advance directive - Full code/I agree   Preventive Screening-Counseling & Management      Drug Use:  no.    Problems Prior to Update: 1)  Fatigue  (ICD-780.79) 2)  Hypersomnia  (ICD-780.54) 3)  Allergic Rhinitis  (ICD-477.9) 4)  Glucose Intolerance  (ICD-271.3) 5)  Depression  (ICD-311) 6)  Hyperlipidemia   (ICD-272.4) 7)  Special Screening Malignant Neoplasm of Prostate  (ICD-V76.44) 8)  Fatigue  (ICD-780.79) 9)  Low Back Pain  (ICD-724.2) 10)  Hypertension  (ICD-401.9) 11)  Hepatitis B, Hx of  (ICD-V12.09) 12)  Gerd  (ICD-530.81) 13)  Colon Cancer, Hx of  (ICD-V10.05)  Medications Prior to Update: 1)  Benicar Hct 40-12.5 Mg  Tabs (Olmesartan Medoxomil-Hctz) .Marland Kitchen.. 1 By Mouth Once Daily 2)  Remeron 15 Mg  Tabs (Mirtazapine) .Marland Kitchen.. 1 Tab Q Hs Prn 3)  Methadone Hcl 5 Mg  Tabs (Methadone Hcl) .... 6 Tabs Qd 4)  Xalatan 0.005 %  Soln (Latanoprost) .... Use As Directed 5)  Proair Hfa 108 (90 Base) Mcg/act  Aers (Albuterol Sulfate) .... 2 Puff Qid As Needed  Current Medications (verified): 1)  Azor 5-40 Mg Tabs (Amlodipine-Olmesartan) .Marland Kitchen.. 1po Once Daily 2)  Remeron 15 Mg  Tabs (Mirtazapine) .Marland Kitchen.. 1 Tab Q Hs Prn 3)  Methadone Hcl 5 Mg  Tabs (Methadone Hcl) .... 6 Tabs Qd 4)  Xalatan 0.005 %  Soln (Latanoprost) .... Use As Directed 5)  Proair Hfa 108 (90 Base) Mcg/act  Aers (Albuterol Sulfate) .... 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         all otherwise negative per pt -    Physical Exam  General:  alert and overweight-appearing.   Head:  normocephalic and atraumatic.   Eyes:  vision grossly intact, pupils equal, and pupils round.   Ears:  R ear normal and L ear normal.   Nose:  no external deformity and no nasal discharge.   Mouth:  no gingival abnormalities and pharynx pink and moist.   Neck:  supple and no masses.   Lungs:  normal respiratory effort and normal breath sounds.   Heart:  normal rate and regular rhythm.   Abdomen:  soft, non-tender, and normal bowel sounds.   Msk:  no joint tenderness and no joint swelling.   Extremities:  no edema, no erythema  Neurologic:  cranial nerves II-XII intact and strength normal in all extremities.   Skin:  color normal and no rashes.   Psych:  not depressed appearing and moderately anxious.     Impression & Recommendations:  Problem # 1:  Preventive Health Care (ICD-V70.0)  Overall doing well, age appropriate education and counseling updated and referral for appropriate preventive services done unless declined, immunizations  up to date or declined, diet counseling done if overweight, urged to quit smoking if smokes , most recent labs reviewed and current ordered if appropriate, ecg reviewed or declined (interpretation per ECG scanned in the EMR if done); information regarding Medicare Prevention requirements given if appropriate; speciality referrals updated as appropriate   Orders: Medicare -1st Annual Wellness Visit [id number])  Problem # 2:  HYPERTENSION (ICD-401.9)  His updated medication list for this problem includes:    Azor 5-40 Mg Tabs (Amlodipine-olmesartan) .Marland Kitchen... 1po once daily out of meds for a few wks - to change to azor per wife request to avoid K loss  Orders: TLB-Udip w/ Micro (  81001-URINE) Prescription Created Electronically [phone])  Problem # 3:  GERD (ICD-530.81) asympt - declines tx at this time   Problem # 4:  HYPERSOMNIA (ICD-780.54) refer pulm for ? OSA - pt declines at this time, but may call later  Problem # 5:  FATIGUE (ICD-780.79)  exam benign, to check labs below; follow with expectant management   Orders: TLB-BMP (Basic Metabolic Panel-BMET) (80048-METABOL) TLB-CBC Platelet - w/Differential (85025-CBCD) TLB-TSH (Thyroid Stimulating Hormone) (84443-TSH) TLB-Sedimentation Rate (ESR) (85652-ESR) TLB-B12 + Folate Pnl (65784_69629-B28/UXL) TLB-IBC Pnl (Iron/FE;Transferrin) (83550-IBC) TLB-Hepatic/Liver Function Pnl (80076-HEPATIC)  Problem # 6:  GLUCOSE INTOLERANCE (ICD-271.3) asympt - for a1c check Orders: TLB-A1C / Hgb A1C (Glycohemoglobin) (83036-A1C)  Complete Medication List: 1)  Azor 5-40 Mg Tabs (Amlodipine-olmesartan) .Marland Kitchen.. 1po once daily 2)  Remeron 15 Mg Tabs (Mirtazapine) .Marland Kitchen.. 1 tab q hs prn 3)  Methadone Hcl 5 Mg Tabs (Methadone hcl) .... 6 tabs qd 4)  Xalatan 0.005 % Soln (Latanoprost) .... Use as directed 5)  Proair Hfa 108 (90 Base) Mcg/act Aers (Albuterol sulfate) .... 2 puff qid as needed  Other Orders: TLB-PSA (Prostate Specific Antigen)  (84153-PSA) TLB-Lipid Panel (80061-LIPID)  Patient Instructions: 1)  Continue all previous medications as before this visit 2)  Please go to the Lab in the basement for your blood and/or urine tests today 3)  Please call the number on the [facility] Card for results of your testing 4)  Please schedule a follow-up appointment in 1 year, or sooner if needed 5)  Check your Blood Pressure regularly. If it is above 140/90 you should make an appointment sooner Prescriptions: AZOR 5-40 MG TABS (AMLODIPINE-OLMESARTAN) 1po once daily  #90 x 3   Entered and Authorized by:   Corwin Levins MD   Signed by:   Corwin Levins MD on 09/17/2010   Method used:   Print then Give to Patient   RxID:   [id number]

## 2011-01-15 NOTE — Medication Information (Signed)
Summary: [facility]  [facility]   Imported By: Sherian Rein 02/08/2010 13:07:40  _____________________________________________________________________  External Attachment:    Type:   Image     Comment:   External Document

## 2011-01-23 NOTE — Miscellaneous (Signed)
Summary: previsit prep/rm  Clinical Lists Changes  Medications: Added new medication of MOVIPREP 100 GM  SOLR (PEG-KCL-NACL-NASULF-NA ASC-C) As per prep instructions. - Signed Rx of MOVIPREP 100 GM  SOLR (PEG-KCL-NACL-NASULF-NA ASC-C) As per prep instructions.;  #1 x 0;  Signed;  Entered by: Sherren Kerns RN;  Authorized by: Louis Meckel MD;  Method used: [facility]  [place] [address], [place], [place]  [address], Ph: [phone] or [phone], Fax: [phone] Observations: Added new observation of ALLERGY REV: Done (01/14/2011 9:44)    Prescriptions: MOVIPREP 100 GM  SOLR (PEG-KCL-NACL-NASULF-NA ASC-C) As per prep instructions.  #1 x 0   Entered by:   Sherren Kerns RN   Authorized by:   Louis Meckel MD   Signed by:   Sherren Kerns RN on 01/14/2011   Method used:   [facility]  [place] [address]       [address]       [place], [place]  [address]       Ph: [phone] or [phone]       Fax: [phone]   RxID:   [id number]

## 2011-01-23 NOTE — Letter (Signed)
Summary: [facility] Instructions  [facility]  [address] [place], [place] [address]   Phone: [phone]  Fax: [phone]       Ryan Davila    1951-09-06    MRN: [id number]        Procedure Day Dorna Bloom:  Lenor Coffin  01/24/11     Arrival Time:  8:30AM      Procedure Time:  9:30AM     Location of Procedure:                    Juliann Pares _  [facility] (4th Floor)                      PREPARATION FOR COLONOSCOPY WITH MOVIPREP   Starting 5 days prior to your procedure 01/19/11 do not eat nuts, seeds, popcorn, corn, beans, peas,  salads, or any raw vegetables.  Do not take any fiber supplements (e.g. Metamucil, Citrucel, and Benefiber).  THE DAY BEFORE YOUR PROCEDURE         DATE: 01/23/11   DAY: WEDNESDAY  1.  Drink clear liquids the entire day-NO SOLID FOOD  2.  Do not drink anything colored red or purple.  Avoid juices with pulp.  No orange juice.  3.  Drink at least 64 oz. (8 glasses) of fluid/clear liquids during the day to prevent dehydration and help the prep work efficiently.  CLEAR LIQUIDS INCLUDE: Water Jello Ice Popsicles Tea (sugar ok, no milk/cream) Powdered fruit flavored drinks Coffee (sugar ok, no milk/cream) Gatorade Juice: apple, white grape, white cranberry  Lemonade Clear bullion, consomm, broth Carbonated beverages (any kind) Strained chicken noodle soup Hard Candy                             4.  In the morning, mix first dose of MoviPrep solution:    Empty 1 Pouch A and 1 Pouch B into the disposable container    Add lukewarm drinking water to the top line of the container. Mix to dissolve    Refrigerate (mixed solution should be used within 24 hrs)  5.  Begin drinking the prep at 5:00 p.m. The MoviPrep container is divided by 4 marks.   Every 15 minutes drink the solution down to the next mark (approximately 8 oz) until the full liter is complete.   6.  Follow completed prep with 16 oz of clear liquid of your choice  (Nothing red or purple).  Continue to drink clear liquids until bedtime.  7.  Before going to bed, mix second dose of MoviPrep solution:    Empty 1 Pouch A and 1 Pouch B into the disposable container    Add lukewarm drinking water to the top line of the container. Mix to dissolve    Refrigerate  THE DAY OF YOUR PROCEDURE      DATE: 01/24/11  DAY: THURSDAY  Beginning at 4:30AM (5 hours before procedure):         1. Every 15 minutes, drink the solution down to the next mark (approx 8 oz) until the full liter is complete.  2. Follow completed prep with 16 oz. of clear liquid of your choice.    3. You may drink clear liquids until 7:30AM (2 HOURS BEFORE PROCEDURE).   MEDICATION INSTRUCTIONS  Unless otherwise instructed, you should take regular prescription medications with a small sip of water   as early as possible the morning  of your procedure.            OTHER INSTRUCTIONS  You will need a responsible adult at least 59 years of age to accompany you and drive you home.   This person must remain in the waiting room during your procedure.  Wear loose fitting clothing that is easily removed.  Leave jewelry and other valuables at home.  However, you may wish to bring a book to read or  an iPod/MP3 player to listen to music as you wait for your procedure to start.  Remove all body piercing jewelry and leave at home.  Total time from sign-in until discharge is approximately 2-3 hours.  You should go home directly after your procedure and rest.  You can resume normal activities the  day after your procedure.  The day of your procedure you should not:   Drive   Make legal decisions   Operate machinery   Drink alcohol   Return to work  You will receive specific instructions about eating, activities and medications before you leave.    The above instructions have been reviewed and explained to me by  Sherren Kerns RN  January 14, 2011 10:17 AM    I fully  understand and can verbalize these instructions _____________________________ Date _________

## 2011-01-24 ENCOUNTER — Other Ambulatory Visit (AMBULATORY_SURGERY_CENTER): Payer: Medicare Other | Admitting: Gastroenterology

## 2011-01-24 ENCOUNTER — Encounter: Payer: Self-pay | Admitting: Gastroenterology

## 2011-01-24 DIAGNOSIS — Z85038 Personal history of other malignant neoplasm of large intestine: Secondary | ICD-10-CM

## 2011-01-24 DIAGNOSIS — Z1211 Encounter for screening for malignant neoplasm of colon: Secondary | ICD-10-CM

## 2011-01-24 LAB — HM COLONOSCOPY

## 2011-01-31 NOTE — Procedures (Signed)
Summary: Colonoscopy  Patient: [name] Note: All result statuses are Final unless otherwise noted.  Tests: (1) Colonoscopy (COL)   COL Colonoscopy           DONE     [facility]     [address]     [place], [place]  [address]           COLONOSCOPY PROCEDURE REPORT           PATIENT:  Ryan Davila, Ryan Davila  MR#:  [id number]     BIRTHDATE:  1951-08-14, 59 yrs. old  GENDER:  male     ENDOSCOPIST:  Barbette Hair. Arlyce Dice, MD     REF. BY:  Kimberlee Nearing, M.D.     PROCEDURE DATE:  01/24/2011     PROCEDURE:  Colonoscopy via colostomy     ASA CLASS:  Class II     INDICATIONS:  screening, history of colon cancer s/p APR 2007;     resected solitary liver met 2008.     MEDICATIONS:   Fentanyl 50 mcg IV, Versed 7 mg IV           DESCRIPTION OF PROCEDURE:   After the risks benefits and     alternatives of the procedure were thoroughly explained, informed     consent was obtained.  Digital rectal exam was performed and     revealed no abnormalities.   The LB 180AL [id number] endoscope was     introduced through the anus and advanced to the cecum, which was     identified by both the appendix and ileocecal valve, without     limitations.  The quality of the prep was good, using MoviPrep.     The instrument was then slowly withdrawn as the colon was fully     examined.     <<PROCEDUREIMAGES>>           FINDINGS:  A normal appearing cecum, ileocecal valve, and     appendiceal orifice were identified. The ascending, hepatic     flexure, transverse, splenic flexure, descending, sigmoid colon,     and rectum appeared unremarkable (see image2, image3, image5,     image6, and image9).   Retroflexed views in the rectum revealed     not done.    The scope was then withdrawn from the patient and the     procedure completed.           COMPLICATIONS:  None     ENDOSCOPIC IMPRESSION:     1) Normal colon     RECOMMENDATIONS:     1) Colonoscopy     REPEAT EXAM:  In 3 year(s) for Colonoscopy.           ______________________________     Barbette Hair. Arlyce Dice, MD           CC:           n.     eSIGNED:   Barbette Hair. Jiraiya Mcewan at 01/24/2011 09:56 AM           Cresenciano Genre, [id number]  Note: An exclamation mark (!) indicates a result that was not dispersed into the flowsheet. Document Creation Date: 01/24/2011 9:57 AM _______________________________________________________________________  (1) Order result status: Final Collection or observation date-time: 01/24/2011 09:52 Requested date-time:  Receipt date-time:  Reported date-time:  Referring Physician:   Ordering Physician: Melvia Heaps [phone]) Specimen Source:  Source: Launa Grill Order Number: [id number] Lab site:   Appended Document: Colonoscopy    Clinical Lists  Changes  Observations: Added new observation of COLONNXTDUE: 01/2014 (01/24/2011 14:43)

## 2011-03-08 LAB — CBC
Hemoglobin: 14 g/dL (ref 13.0–17.0)
MCHC: 33.5 g/dL (ref 30.0–36.0)
Platelets: 225 10*3/uL (ref 150–400)
RDW: 12.6 % (ref 11.5–15.5)

## 2011-03-08 LAB — PROTIME-INR
INR: 0.84 (ref 0.00–1.49)
Prothrombin Time: 11.4 seconds — ABNORMAL LOW (ref 11.6–15.2)

## 2011-04-23 ENCOUNTER — Encounter: Payer: Self-pay | Admitting: Internal Medicine

## 2011-04-23 ENCOUNTER — Ambulatory Visit (INDEPENDENT_AMBULATORY_CARE_PROVIDER_SITE_OTHER)
Admission: RE | Admit: 2011-04-23 | Discharge: 2011-04-23 | Disposition: A | Discharge status: Home / Self Care | Payer: Medicare Other | Source: Ambulatory Visit | Attending: Internal Medicine | Admitting: Internal Medicine

## 2011-04-23 ENCOUNTER — Ambulatory Visit (INDEPENDENT_AMBULATORY_CARE_PROVIDER_SITE_OTHER): Payer: Medicare Other | Admitting: Internal Medicine

## 2011-04-23 VITALS — BP 124/74 | HR 85 | Temp 97.8°F | Ht 74.0 in | Wt 236.1 lb

## 2011-04-23 DIAGNOSIS — M79672 Pain in left foot: Secondary | ICD-10-CM

## 2011-04-23 DIAGNOSIS — R7302 Impaired glucose tolerance (oral): Secondary | ICD-10-CM | POA: Insufficient documentation

## 2011-04-23 DIAGNOSIS — F329 Major depressive disorder, single episode, unspecified: Secondary | ICD-10-CM

## 2011-04-23 DIAGNOSIS — Z0000 Encounter for general adult medical examination without abnormal findings: Secondary | ICD-10-CM | POA: Insufficient documentation

## 2011-04-23 DIAGNOSIS — I10 Essential (primary) hypertension: Secondary | ICD-10-CM

## 2011-04-23 DIAGNOSIS — F3289 Other specified depressive episodes: Secondary | ICD-10-CM

## 2011-04-23 DIAGNOSIS — M79609 Pain in unspecified limb: Secondary | ICD-10-CM

## 2011-04-23 HISTORY — DX: Impaired glucose tolerance (oral): R73.02

## 2011-04-23 NOTE — Progress Notes (Signed)
Quick Note:  Voice message left on [employer]Tree system - lab is negative, normal or otherwise stable, pt to continue same tx ______ 

## 2011-04-23 NOTE — Patient Instructions (Signed)
Please go to XRAY in the Basement for the x-ray test Continue all other medications as before

## 2011-04-23 NOTE — Assessment & Plan Note (Signed)
Mod suspicion for fracture vs severe spriain, declines pain med, for left foot film today

## 2011-04-23 NOTE — Progress Notes (Signed)
  Subjective:    Patient ID: Ryan Davila, male    DOB: Aug 08, 1951, 60 y.o.   MRN: [id number]  HPI  Here to f/u , has been followed by oncology closely recently.; today c/o severe pain to left great toe adn MTP area with large bruising, sweling after a mist-step going up a stair and missing the step, essentially jamming the toe severely.  No fall, other injury,  And Pt denies chest pain, increased sob or doe, wheezing, orthopnea, PND, increased LE swelling, palpitations, dizziness or syncope.  Pt denies new neurological symptoms such as new headache, or facial or extremity weakness or numbness   Pt denies polydipsia, polyuria, Denies worsening depressive symptoms, suicidal ideation, or panic, though has mild anxiety related to his cancer.    Past Medical History  Diagnosis Date  . Impaired glucose tolerance 04/23/2011  . COLON CANCER, HX OF 04/18/2008  . ALLERGIC RHINITIS 04/18/2008  . DEPRESSION 04/18/2008  . GERD 04/18/2008  . HYPERLIPIDEMIA 04/18/2008  . HYPERTENSION 04/18/2008  . HEPATITIS B, HX OF 04/18/2008   No past surgical history on file.  reports that he has quit smoking. He does not have any smokeless tobacco history on file. He reports that he drinks alcohol. He reports that he does not use illicit drugs. family history is not on file. No Known Allergies No current outpatient prescriptions on file prior to visit.    Review of Systems All otherwise neg per pt     Objective:   Physical Exam BP 124/74  Pulse 85  Temp(Src) 97.8 F (36.6 C) (Oral)  Ht 6\' 2"  (1.88 m)  Wt 236 lb 2 oz (107.106 kg)  BMI 30.32 kg/m2  SpO2 94% Physical Exam  VS noted Constitutional: Pt appears well-developed and well-nourished.  HENT: Head: Normocephalic.  Right Ear: External ear normal.  Left Ear: External ear normal.  Eyes: Conjunctivae and EOM are normal. Pupils are equal, round, and reactive to light.  Neck: Normal range of motion. Neck supple.  Cardiovascular: Normal rate and regular rhythm.     Pulmonary/Chest: Effort normal and breath sounds normal.  Abd:  Soft, NT, non-distended, + BS Neurological: Pt is alert. No cranial nerve deficit. motor /dtr's intact Left great toe adn first mtp area with severe bruising, swelling without ulcer, red streaks or fluctuance;  Dorsalis pedis pulse bilat intact Skin: Skin is warm. No erythema.  Psychiatric: Pt behavior is normal. Thought content normal. not depressed or nervous appearing        Assessment & Plan:

## 2011-04-23 NOTE — Assessment & Plan Note (Addendum)
stable overall by hx and exam, most recent lab reviewed with pt, and pt to continue medical treatment as before  BP Readings from Last 3 Encounters:  04/23/11 124/74  10/25/10 134/80  09/17/10 158/88

## 2011-04-23 NOTE — Assessment & Plan Note (Signed)
stable overall by hx and exam, most recent lab reviewed with pt, and pt to continue medical treatment as before  Lab Results  Component Value Date   WBC 9.6 09/17/2010   HGB 15.3 09/17/2010   HCT 43.2 09/17/2010   PLT 300.0 09/17/2010   CHOL 240* 09/17/2010   TRIG 349.0* 09/17/2010   HDL 38.40* 09/17/2010   LDLDIRECT 150.0 09/17/2010   ALT 33 09/17/2010   AST 28 09/17/2010   NA 136 09/17/2010   K 3.7 09/17/2010   CL 99 09/17/2010   CREATININE 1.2 09/17/2010   BUN 13 09/17/2010   CO2 27 09/17/2010   TSH 1.17 09/17/2010   PSA 0.35 09/17/2010   INR 0.84 02/22/2010   HGBA1C 5.5 09/17/2010

## 2011-05-03 ENCOUNTER — Encounter: Payer: Self-pay | Admitting: Internal Medicine

## 2011-05-03 NOTE — Op Note (Signed)
[facility]  Patient:    Ryan Davila, Ryan Davila Visit Number: [id number] MRN: [id number]          Service Type: DSU Location: [facility] Attending Physician:  Twana First Dictated by:   Elana Alm Thurston Hole, M.D. Proc. Date: 11/16/01 Admit Date:  11/16/2001                             Operative Report  PREOPERATIVE DIAGNOSIS:  Left shoulder partial rotator cuff tear with impingement.  POSTOPERATIVE DIAGNOSIS:  Left shoulder partial rotator cuff tear with impingement.  OPERATION/PROCEDURE: 1. Left shoulder examination under anesthesia followed by arthroscopic partial    rotator cuff tear debridement. 2. Left shoulder subacromial decompression.  SURGEON:  Elana Alm. Thurston Hole, M.D.  ASSISTANT:  Julien Girt, P.A.  ANESTHESIA:  General anesthesia.  OPERATIVE TIME:  40 minutes.  COMPLICATIONS:  None.  INDICATION FOR PROCEDURE:  Mr. Kirn is a 60 year old gentleman who has had significant left shoulder pain for the past 10 to 12 weeks secondary to a fall and injury.  persistent pain with MRI documenting a partial rotator cuff tear with impingement who has failed conservative care and is now to undergo arthroscopy.  DESCRIPTION OF PROCEDURE:  Mr. Gunning was brought to the operating room on November 16, 2001, after supraclavicular block had been placed in the holding room.  He was placed on the operative table in supine position.  His left shoulder was examined under anesthesia.  He had full range of motion of his shoulder with stable ligamentous exam.  After this was done, he was placed in a beach-chair position and his shoulder and arm were prepped using sterile Betadine and draped using sterile technique.  Originally, through a posterior arthroscopic portal, the arthroscope with a pump attachment was placed and through an anterior portal, an arthroscopic probe was placed.  On initial inspection, the articular cartilage, glenohumeral joint  was intact.  Anterior and posterior labrum intact.  Superior labrum and biceps tendon anchor was intact.  Biceps tendon was intact.  Inferior labrum and anterior inferior glenohumeral ligament complex was intact.  Inferior capsular recess was free of pathology. The rotator cuff was inspected from the articular surface and showed no evidence of a tear.  Subacromial space was entered and a lateral arthroscopic portal was made. Significant bursitis was resected. Underneath this, the rotator cuff showed some fraying and a partial tear but no evidence of a complete tear.  Subtotal bursectomy was carried out as well as a subacromial decompression removing 6 to 8 mm of the undersurface of the anterior, anterior lateral and anterior medial acromion and a CA ligament release carried out.  After this was done, the shoulder could be brought through a full range of motion with no impingement on the rotator cuff.  At this point, it was felt that all pathology had been satisfactorily addressed. The instruments were removed.  Portals were closed with 3-0 nylon suture and injected with 0.25% Marcaine with epinephrine and sterile dressings were applied.  The patient was awakened and taken to the recovery room in stable condition.  FOLLOW-UP CARE:  Mr. Tucholski will be followed as an outpatient on Vicodin and Naprosyn.  Begin early physical therapy.  See him back in the office in a week for sutures out and follow-up. Dictated by:   Elana Alm Thurston Hole, M.D. Attending Physician:  Twana First DD:  11/16/01 TD:  11/16/01 Job: [id number]

## 2011-05-03 NOTE — Assessment & Plan Note (Signed)
[facility]                            CARDIOLOGY OFFICE NOTE   Ryan Davila, Ryan Davila                     MRN:          [id number]  DATE:04/21/2007                            DOB:          09/26/1951    REFERRING PHYSICIAN:  Corwin Levins, MD   REASON FOR REFERRAL:  The patient with syncope.   HISTORY OF PRESENT ILLNESS:  The patient is a very pleasant 60 year old  gentleman with a history of chest discomfort.  He had a stress perfusion  study in 1998 and again in 2004 which was negative for any evidence of  ischemia. He gets along relatively well, though he is limited by chronic  back pain.  He had an episode of syncope x2 on 1 day in late April.  Both episodes occurred when he had been lying down and stood up quickly  to attend to his grandson.  He took several steps and then lost  consciousness.  These were un-witnessed.  He did not have trauma.  No  loss of bowel or bladder.  He had no prodrome. He knew where he was when  he woke up.  Since that time, he has had a couple of episodes of light  headedness but nothing approaching syncope.  He has not had any  palpitations.  He can do activities such as pushing a self-propelled  mower and vacuum.  With this he denies any of these symptoms.  He denies  any chest discomfort, neck discomfort, arm discomfort with activity.  He  does not have any vomiting, excessive diaphoresis.  He has had no  shortness of breath, denies any PND or orthopnea.   PAST MEDICAL HISTORY:  Diet controlled dyslipidemia, glaucoma,  depression - well treated, hypertension x5 years, gastroesophageal  reflux disease.   PAST SURGICAL HISTORY:  Rectal adenocarcinoma, status post resection (he  is still ongoing chemotherapy), appendectomy, hand surgery, liver mets  resected.   ALLERGIES:  None   CURRENT MEDICATIONS:  1. Methadone 5 mg q.i.d.  2. Lyrica 5 mg daily.  3. Compazine 10 mg p.r.n.  4. Benicar/hydrochlorothiazide  40/12.5 daily.  5. Xalatan.  6. AeroBid.  7. K-tabs.  8. Restoril.  9. Prozac 20 mg daily.  10.Temovate cream b.i.d.  11.Avastin.  12.Eloxatin.   SOCIAL HISTORY:  The patient is disabled from his chronic back pain.  He  is married.  He has 3 children and 6 grandchildren.  He does care for  his youngest 2 grandchildren  He stopped smoking after 2 packs per no  day for 20 years.  He quit in 1989.   FAMILY HISTORY:  Is contributory of his father having fatal myocardial  infarction at the age of 54.   REVIEW OF SYSTEMS:  As stated in the HPI, positive for arthritis.   PHYSICAL EXAMINATION:  The patient is very pleasant.  He is in no  distress.  Blood pressure 129/80, heart rate 90 and regular.  Body mass index 26,  weight 201 pounds. (No orthostatic blood pressure drop, though he did  increase his heart rate by 20 beats per minute while  standing.)  HEENT:  Eyes unremarkable.  Pupils are equal, round and reactive to  light, fundi within normal limits.  Oral mucosa unremarkable.  NECK:  No jugular venous distention.  Wave form within normal limits.  Carotid upstroke brisk and symmetric.  No bruits, no thyromegaly.  LYMPHATICS:  No cervical, axillary or inguinal adenopathy.  LUNGS:  Clear to auscultation bilaterally.  BACK:  No costovertebral angle tenderness.  CHEST:  Unremarkable.  HEART:  PMI not displaced or sustained, S1 and S2 within normal limits,  no S3, no S4, no clicks, no rubs, no murmurs.  ABDOMEN:  Well healed surgical scars.  Positive bowel sounds.  Normal in  frequency, pitch, no bruits, no rebound, no guarding, no midline pulses,  no mass, no hepatomegaly, no splenomegaly, left lower quadrant colostomy  bag.  SKIN:  No rashes, no nodules.  EXTREMITIES:  2+ pulses throughout, no edema, no cyanosis, no clubbing.  NEURO:  Oriented to person, place and time.  Cranial nerves II through  XII grossly intact, motor grossly intact.   EKG sinus tachycardia, rate 90s.  Axis  within normal limits. Intervals  within normal limits, no acute ST-T wave changes.   ASSESSMENT AND PLAN:  1. Syncope. The patient's syncope sounds very much like orthostasis.      He might have some autonomic dysfunction. 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  He may      have some autonomic dysfunction related to this as well.  However,      at this point it seems to be somewhat mild.  I will get an      echocardiogram to make sure he has normal cardiac function and no      valvular abnormalities, although I do not suspect this.  At this      point, I would treat this conservatively.  We discussed avoidance      of situations which could exacerbate orthostasis.  If this      continues, I might reduce the dose of his Benicar.  Compression      stockings might help.  If he has further syncope not amendable to      these maneuvers, then I would proceed with further evaluation,      probably to      include an event monitor.  2. Followup will be based on future symptoms and the results of the      echocardiogram.     Rollene Rotunda, MD, [facility]     JH/MedQ  DD: 04/21/2007  DT: 04/22/2007  Job #: [id number]   cc:   Corwin Levins, MD

## 2011-05-03 NOTE — Consult Note (Signed)
NAMEGEORDAN, XU                          ACCOUNT NO.:  [id number]   MEDICAL RECORD NO.:  [id number]                    PATIENT TYPE:  INP   LOCATION:                                       FACILITY:   PHYSICIAN:  Zachary George, DO                      DATE OF BIRTH:  1951/06/14   DATE OF CONSULTATION:  02/04/2003  DATE OF DISCHARGE:                                   CONSULTATION   [facility]   HISTORY:  Mr. Moch returns to the clinic today, accompanied by his wife  for further discussion in regards to pain management.  He was last seen on  01/28/03.  He continues to have significant coccygeal pain which is improved  with Lortab.  His wife had multiple questions in regards to his pain  complaints and the etiology of his pain.  She wandered whether or not his  pain is secondary to nerve injury of some sort, as he has had surgery for  rectal cancer.  His symptoms are very specific and located focally at the  coccyx and seemed to mainly mechanical with direct pressure on the coccyx.  We discussed these symptoms at length.  There is still some concern about  performing any physical modalities such as ultrasound and E-stem as he has  had malignancy.  The patient's pain today is an 8/10 on subjective scale.  I  reviewed Health and History Form and 14-Point Review of Systems.   IMPRESSIONS:  1. Coccydynia.  2. Status post resection for rectal cancer.   PLAN:  Discussed further treatment options with Mr. Garate and his wife.  It is conceivable that he has a sympathetic component and may benefit from a  ganglion impar injection.  However, his symptoms are not exactly classic for  sympathetic mediated pain in this region, as patient's symptoms are very  focal and not associated with any burning or urgency, although he does have  some discomfort, he describes, when he urinates.  It may be worth having him  get a second opinion from one of my pain  colleagues here in town in regards  to possible ganglion impar injection versus other etiology of his pain.  I  discussed the possibility that we may not identify his pain generator and  that he may need to use pain medications on a prolonged basis.  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  I answered multiple questions.  I told Mr. Bala I  would discuss this with a few of my colleagues in town and get back to him  next week in terms of further decision making in regards to his pain  management.  He understands and agrees with the plan.   The patient was educated in the above findings and  recommendations and  understands.  No barriers of communication.                                                Zachary George, DO    JW/MEDQ  D:  02/04/2003  T:  02/05/2003  Job:  [id number]

## 2011-05-03 NOTE — Consult Note (Signed)
NAME:  Ryan Davila, Ryan Davila                        ACCOUNT NO.:  [id number]   MEDICAL RECORD NO.:  [id number]                   PATIENT TYPE:  REC   LOCATION:  [facility]                                  FACILITY:  [facility]   PHYSICIAN:  Zachary George, DO                      DATE OF BIRTH:  23-Jul-1951   DATE OF CONSULTATION:  DATE OF DISCHARGE:                                   CONSULTATION   The patient returns to clinic today for reevaluation.  He was last seen on  02/04/03.  His pain is essentially unchanged. He rates it a 6/10 on a  subjective scale.  Still focally located at the tip of his coccyx.  He does  get relief with Lortab 7.5/500 mg 1/2 to 1 tablet.  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  We have been discussing further treatment options to  include interventional procedures and I would like to refer him to Thyra Breed, M.D. for second opinion and consideration for further  interventional procedures to help control his pain.  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  He continues to have some lower back discomfort,  but he states that it is mild.  Essentially has no radicular complaints.  Most of his pain is due to putting pressure directly on the tip of his  coccyx.  His function and quality of life indices remain declined overall.   I reviewed the health and history form and 14 point review of systems.   PHYSICAL EXAMINATION:  GENERAL:  Reveals a healthy male in no acute  distress.  VITAL SIGNS:  Blood pressure 158/84, pulse 81, respirations 18.  O2  saturation 95% on room air.  EXTREMITIES:  Palpatory examination reveals point of maximal tenderness over  the tip of the coccyx reproducing patient's symptoms.  There is no  tenderness to palpation today over the lumbar paraspinal muscles.  Manual  muscle testing is 5/5  bilateral lower extremities.  Sensory exam is intact  to light touch bilateral lower extremities.  Muscle stretch reflexes are  symmetrical bilateral lower extremities.   IMPRESSION:  1. Coccydynia.  2. Low back pain, intermittent, without lower extremity radicular symptoms.     I do not suspect that his coccydynia is secondary to lumbar pathology.     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   PLAN:  The patient, his wife and I discussed further treatment options.  It  is reasonable at this time to consider further minimally invasive procedures  to try to help control his pain.  He has only had very temporary relief with  local infiltration of anesthetic and steroid.  I will refer him to  Dr. Thyra Breed for further evaluation and discussion of further interventional  management.  The patient agrees to this plan.  1. Will continue with Lortab 7.5/500 mg 1/2 to 1 p.o. t.i.d. as needed.  #90     with one refill.  2. Will keep the patient off of work 02/24/03 through 04/26/03.  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  3. The patient to return to clinic on as-needed basis.   The patient was educated in the above findings and recommendations and  understands.  No barriers to communication.                                               Zachary George, DO    JW/MEDQ  D:  02/24/2003  T:  02/24/2003  Job:  [id number]   cc:   Loraine Leriche L. Vear Clock, M.D.  [address]  [place]  [place] [address]  Fax: [phone]

## 2011-05-03 NOTE — Consult Note (Signed)
NAME:  Ryan Davila, Ryan Davila                        ACCOUNT NO.:  [id number]   MEDICAL RECORD NO.:  [id number]                   PATIENT TYPE:  REC   LOCATION:  [facility]                                  FACILITY:  [facility]   PHYSICIAN:  Sondra Come, D.O.                 DATE OF BIRTH:  28-Mar-1951   DATE OF CONSULTATION:  09/29/2002  DATE OF DISCHARGE:                                   CONSULTATION   The patient returns to clinic today as scheduled for reevaluation.  He was  initially seen two weeks ago.  He continues to have severe coccygeal pain  and seems to be very frustrated with his condition.  He continues taking  hydrocodone 7.5 mg 1 to 2 per day as needed.  He did not get any relief with  the trial of Bextra or Lidoderm patches.  He has not been to physical  therapy yet.  He had been hesitant to order any electric stimulation or  ultrasound because the patient is status post rectal surgery for carcinoma,  and I need confirmation that the patient does not have any further  malignancy in that region.  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  We discussed reasonable  expectations for this pain.  I am not certain whether or not this pain can  be cured; however, we have options to try to alleviate his discomfort and  manage it a little better.  This includes not only medications but some  physical modalities and possibly even local corticosteroid injections.  Again, the patient is very frustrated with this.  I reviewed the health and  history form and 14-point Review of Systems.  The patient does get  occasional radicular symptoms in the back of his thighs bilaterally.  He  does occasionally get some pain in his lower back as well, but he states  this only occurs when the pain in his coccygeal region is very severe.  He  states he had some MRIs performed.  I am unsure of whether or not this was  an MRI of his lumbar spine.  I will go ahead  and request records from his  surgeon as well as from [facility].   PHYSICAL EXAMINATION:  GENERAL:  Healthy-appearing male in no acute  distress.  The patient does appear frustrated, however.  VITAL SIGNS:  Blood pressure 141/81, pulse 102, respirations 16, O2  saturation 100% on room air.  NEUROLOGIC:  Significant tenderness to palpation over the coccyx.  No  tenderness to palpation over the lumbar paraspinous.  No new neurologic  findings on lower extremities including motor, sensory, and reflexes.   IMPRESSION:  Coccydynia.   PLAN:  1. Continue to discuss treatment options with the patient.  This was an     extensive consultation of greater than 25 minutes duration discussing  treatment options.  Initially I will gather for the record to include     imaging studies and surgical reports.  2. Consider adding electric stimulation to physical therapy regimen.  3. Consider local steroid injection.  4. Consider a low-dose tricyclic antidepressant to assist with sleep,     although the patient's work schedule is very hectic as he is on call     between 6 and 10 p.m. and 6 and 10 a.m. every day.  5. It is reasonable to continue with the hydrocodone at this point.  The     patient seems to be using this very sparingly and does not appear to be     abusing the medication. Would like to transition him to non-narcotic     alternatives over time if possible.  Prescription for Lortab 7.5/500 one-     half to one p.o. b.i.d. as needed, #60 without refills.  6. The patient is to return to clinic in two weeks for re-evaluation.  7.     The patient remains in contact with his primary care Broox Lonigro.  8. The patient was educated about findings and recommendations and     understands.  There were no barriers to communication.                                               Sondra Come, D.O.    JJW/MEDQ  D:  09/29/2002  T:  09/30/2002  Job:  [id number]   cc:   Corwin Levins, M.D.  [facility]

## 2011-05-03 NOTE — Op Note (Signed)
[facility]  Patient:    Ryan Davila, VOTH Visit Number: [id number] MRN: [id number]          Service Type: SUR Location: [id number] Attending Physician:  Sonda Primes Dictated by:   Mardene Celeste. Lurene Shadow, M.D. Proc. Date: 02/23/02 Admit Date:  02/23/2002                             Operative Report  PREOPERATIVE DIAGNOSIS:  Carcinoma of the rectum.  POSTOPERATIVE DIAGNOSIS:  Carcinoma of the rectum.  OPERATION PERFORMED:  Abdominoperineal resection.  SURGEON:  Mardene Celeste. Lurene Shadow, M.D.  ASSISTANT:  Marnee Spring. Wiliam Ke, M.D.  ANESTHESIA:  General.  INDICATIONS FOR PROCEDURE:  The patient is a 60 year old man with a history of persistent bleeding from the rectum and discovered on flexible sigmoidoscopy to have a low-lying rectal lesion which on biopsy shows a high grade carcinoma in situ.  CT scans of the abdomen show no evidence of metastatic disease.  His CEA is 1.2 and liver function studies are within normal limits.  Full discussion of the risks and benefits of abdominoperineal resection with the patient and his spouse has been carried out.  The patient is aware that postoperatively he may have some difficulty urinating.  He also is aware that he may have significant erectile dysfunction.  DESCRIPTION OF PROCEDURE:  Following the induction of satisfactory general anesthesia, the patient was placed in a modified Miles position with his legs in stirrups.  His entire abdomen and genitalia and perineum were prepped and draped to be included in a sterile operative field.   Exploratory laparotomy was carried out through a midline incision extending from the pubis up to and above the umbilicus, deepened through the skin and subcutaneous tissues into the linea alba into the abdomen.  On entering the abdomen a thorough exploration of the abdomen was carried out.  The liver surfaces were smooth. There was no evidence of peritoneal seeding.  The  periaortic lymph nodes did not appear to be enlarged and the pelvis was otherwise without evidence of tumor spread.  Dissection of the sigmoid colon was started in the left paracolic gutter carrying the dissection down over the pelvic brim with full identification of the ureters on both the right and left side.  The dissection was carried down into the retrorectal space down into the sacral hollow carrying the dissection all the way down to the tip of the coccyx at the levator sling.  Laterally, the lateral rectal stumps were taken between clips and transected carrying the dissection down laterally on both sides.  The dissection was carried around anteriorly to separate the bladder from the region of the rectum as dissection was carried down to the levator on all sides.  We then transected the colon at the midsigmoid with use of a GIA stapler and secured the mesentery with clamps and ties of 2-0 silk.  The major vascular pedicle of the sigmoid vessels was taken and doubly tied with 0 silk sutures.  A portion of the dissection was now complete.  We then turned out attention to the perineum.  With the legs now raised, the perineal dissection was begun by making an elliptical incision around the anus extending the ellipse up to just medial to the ischial tuberosity.  Dissection was deepened down to the levator sling and carried posteriorly down to the coccygeal ligament.  The coccygeal ligament was transected and the sacral hollow  entered.  I serially transected the levator sling laterally on both the right and left sides carrying the dissection up to the region of the prostate.  The sigmoid colon was then passed through the sacral hollow and the fascia between the rectum and the prostate was then taken down serially with electrocautery. The specimen was then removed and forwarded for pathologic evaluation. Hemostasis was assured with electrocautery.  The levator sling was then closed in the  midline with running sutures of 0 Vicryl in two layers and the skin was closed with a subcuticular stitch of 0 Vicryl.  This was accomplished after two 19 [place] Blake drains had been passed through the perineum into the pelvis for drainage.  I then changed gloves and gown and returned to the abdominal portion.  The drains were positioned in the pelvis.  The sacral hollow was inspected.  Hemostasis was noted to be excellent.  The pelvis was then closed by suturing the bladder flap and the parietal peritoneum together so as to completely close the pelvis.  I then fashioned a colostomy on the anterior abdominal wall by making an incision through the anterior abdominal wall down through the rectus muscles of the abdomen.  The proximal sigmoid colon was then freed and brought up through this colostomy in a nonstricturing fashion.  All sponge, instrument and sharp counts were verified.  The abdominal wound was then closed in layers with #1 Novofil suture.  The subcutaneous tissues were then irrigated and the skin closed with staples.  I then matured the colostomy site by suturing the mucosa to the dermis of the abdominal wall skin with multiple interrupted sutures of 3-0 Vicryl.  Sterile dressings were then applied to the wound.  The colostomy was bagged.  The drains in the perineum were then charged and the patient removed from the operating room to the recovery room in stable condition having tolerated the procedure well after anesthesia was reversed. Dictated by:   Mardene Celeste. Lurene Shadow, M.D. Attending Physician:  Sonda Primes DD:  02/25/02 TD:  02/25/02 Job: [id number]

## 2011-05-03 NOTE — Consult Note (Signed)
. [facility]  Patient:    CLOYCE, BLANKENHORN Visit Number: [id number] MRN: [id number]          Service Type: SUR Location: 5700 [id number] Attending Physician:  Sonda Primes Dictated by:   Rosemarie Ax, N.P. Proc. Date: 02/24/02 Admit Date:  02/23/2002   CC:         [facility]  Barbette Hair. Arlyce Dice, M.D. [facility]  Luisa Hart L. Lurene Shadow, M.D.   Consultation Report  DATE OF BIRTH:  Dec 08, 1951.  REASON FOR CONSULTATION:  Rectal mass.  REQUESTING PHYSICIAN:  Mardene Celeste. Lurene Shadow, M.D.  HISTORY OF PRESENT ILLNESS:  The patient is a 60 year old gentleman who has had rectal bleeding intermittently for four years. Sigmoidoscopy four years was negative. Colonoscopy two years later revealed three polyps which were removed by Dr. Arlyce Dice. The patient continued to have rectal bleeding, attributed to hemorrhoids. Pathology report number [[id number]] was negative for malignancy. 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 The colonoscopy on February was negative according to the patient as no biopsy report is available.  CT of the abdomen on February 12, 2002 was negative for metastatic disease. A CT of the pelvis showed bowel wall thickening associated with the rectum. The patient was admitted for abdominoperineal resection on February 25, 2002 and colostomy placement in the left lower quadrant.  PAST MEDICAL HISTORY: 1. Hypertension x 1 year. 2. Glaucoma. 3. Asthma. 4. Hepatitis ? 5. Hyperlipidemia. 6. Osteoarthritis. 7. Hiatal hernia.  PAST SURGICAL HISTORY: 1. Appendectomy. 2. Left rotator cuff repair. 3. Right hand ligament repair. 4. T&A as a child. 5. Cyst on neck removed.  ALLERGIES:  No known drug allergies.  MEDICATIONS: 1. Aller-Chlor one p.o. q.d. 2. Allegra 60 mg one p.o. b.i.d. 3. Xalatan  one drop each eye q.d.  FAMILY HISTORY:  Mother is alive and well at 64 with hypertension, asthma, diverticulitis. Father died at 57 of an MI. There is one brother 39 alive and well. There are 3 sisters, one with kidney cancer at age 51 and irritable bowel syndrome, one at 59 years of age with asthma and degenerative joint disease, there is another alive and well. Grandfather died of colon cancer.  SOCIAL HISTORY:  The patient lives in [place] with his wife Lanora Manis who he has been married to for 30 years. He works as a Naval architect and they have three children ages 54, 74, and 83. He has a 30-year pack smoking history and he quit in the 1980s. He denies any ethanol use.  REVIEW OF SYSTEMS:  The patient has an occasional headache. He denies any vision changes, cough, hemoptysis, or dyspnea. He has had no chest pain or pleuritic pain. He has had some fatigue and weakness. He denies any weight loss. He has not had any nausea or vomiting, or indigestion. There has been no night sweats, dysuria, frequency, or urgency. There is occasional pedal edema, and he has had positive rectal bleeding for four years.  PHYSICAL EXAMINATION:  GENERAL:  This is a 60 year old white male appearing to be in pain.  VITAL SIGNS:  Temperature 98.7, pulse 94, respirations 20, blood pressure 189/76.  HEENT:  Normocephalic, EOMI are intact, PERRLA, sclerae nonicteric, mucous membranes are moist without plaque or lesions.  NODES:  No cervical, axillary, or inguinal nodes appreciated.  CHEST:  Clear to auscultation bilaterally.  ABDOMEN:  Tender throughout. No liver borders palpable. Colostomy  with bag intact, perineal drain intact, surgical dressing with fresh drainage.  EXTREMITIES:  Pressure boots, no cyanosis, clubbing, or edema noted.  NEUROLOGICAL:  Alert and oriented. Cranial nerves II-XII intact. DTRs 2+. Strength is 5/5.  LABORATORY AND ACCESSORY DATA:  CEA 1.2. WBC 12.1, hemoglobin 10.8,  hematocrit 30.4, and platelets at 184. Hepatitis acute panel was negative. Sodium 132, potassium 4.3, chloride 104, CO2 27, glucose 146, BUN 6, creatinine 0.7, calcium 7.9. Total protein 4.5, albumin 2.5, AST 20, ALT 26, alkaline phos 35, total bilirubin 0.6.  ASSESSMENT AND PLAN:  This is a 60 year old white male with a long history of rectal bleeding and family history of colon cancer, now status post abdominoperineal resection with colostomy for malignancy. Pathology report is pending.  PLAN:  Per Dr. Kimberlee Nearing. Pathology report now available [[id number]] stage I status post APR on February 23, 2002. Conventional wisdom has been that no adjunctive chemotherapy or radiation is required. But Dr. Arline Asp has seen two patients within the past 3-4 years with stage I rectal tumors who have had pelvic recurrences. We will review the pathology and consult with a rectal specialist at [facility].  Thank you very much for this consultation. Dictated by:   Rosemarie Ax, N.P. Attending Physician:  Sonda Primes DD:  02/26/02 TD:  02/27/02 Job: [id number]

## 2011-05-03 NOTE — Consult Note (Signed)
NAME:  Ryan Davila, Ryan Davila                        ACCOUNT NO.:  [id number]   MEDICAL RECORD NO.:  [id number]                   PATIENT TYPE:  REC   LOCATION:  [facility]                                  FACILITY:  [facility]   PHYSICIAN:  Zachary George, DO                      DATE OF BIRTH:  1951-06-13   DATE OF CONSULTATION:  12/30/2002  DATE OF DISCHARGE:                                   CONSULTATION   HISTORY OF PRESENT ILLNESS:  The patient returns to the clinic for  reevaluation.  He was last seen on November 29, 2002.  He continues to  complain of pain in his coccygeal and rectal area, status post colorectal  surgery for rectal cancer.  The patient states that he has had several good  days over the past few weeks secondary to the holidays and decreased  driving.  He is still waiting for a cushion for his seat to assist with pain  relief.  His pain today is a 7/10 on a subjective scale.  He continues to  take ____ 7.5 mg, 1/2 to 1 b.i.d.  Most of the time he states he gets by  with one Vicodin per day and occasionally needs two per day.  He denies any  side effects from the medication.  He states that it does not make him  drowsy whatsoever.  It is allowing him to function and to continue working.  I have counseled him on potential side effects to include drowsiness and  that he needs to use his best judgment when driving while on the medication.  I reviewed the health and history form and 14-point review of systems.   PHYSICAL EXAMINATION:  VITAL SIGNS:  Blood pressure 160/89, pulse 89,  respirations 20, O2 saturation 96% on room air.  NEUROLOGIC:  Palpatory examination reveals tenderness over the coccygeal  region.  Range of motion of the lumbar spine is full.   IMPRESSION:  1. Coccydynia.  2. Low back pain, intermittent, mild, with infrequent radiation into the     buttocks and thighs bilaterally.   PLAN:  1. Continue stretching program.  2. Continue ____ 7.5 mg, 1-2 to 1 p.o. b.i.d.  as needed.  No new     prescription required today.  Again, the patient has been counseled on     potential side effects of the medication and instructed to use his best     judgment when driving or operating machinery.  3. Consider MRI of the lumbar spine; however, his low back pain and lower     extremity symptoms are infrequent.  I do not think that this is     contributing to his current coccydynia status post rectal surgery for     rectal cancer.  4. I have provided the patient with a website for [employer] for     investigation into obtaining a cushion  for his seat to try to help     alleviate his symptoms.  5. Patient to return to clinic in two months for reevaluation.  He is     instructed to call our clinic for refills on his medications when he runs     out.   The patient was educated on the above findings and recommendations and  understands.  There were no barriers to communication.                                               Zachary George, DO    JW/MEDQ  D:  12/30/2002  T:  12/30/2002  Job:  [id number]   cc:   Corwin Levins, M.D. [facility]

## 2011-05-03 NOTE — Discharge Summary (Signed)
[place]. [facility]  Patient:    Ryan Davila, Ryan Davila Visit Number: [id number] MRN: [id number]          Service Type: SUR Location: [id number] Attending Physician:  Sonda Primes Dictated by:   Mardene Celeste. Lurene Shadow, M.D. Admit Date:  02/23/2002 Discharge Date: 03/05/2002   CC:         Lindaann Slough, M.D.  Samul Dada, M.D.  Barbette Hair. Arlyce Dice, M.D. [facility]   Discharge Summary  ADMISSION DIAGNOSES: 1. Carcinoma of rectum. 2. Hypertension. 3. Gastroesophageal reflux disease.  DISCHARGE DIAGNOSES: 1. Carcinoma of rectum status post abdominal perineal resection. 2. Hypertension. 3. Gastroesophageal reflux disease.  PROCEDURES:  Abdominal perineal resection.  COMPLICATIONS:  Urinary retention postoperatively.  CONSULTATIONS: 1. Oncology, Samul Dada, M.D. 2. Urology, Lindaann Slough, M.D.  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  Tumor size 3.5 cm extending into but not completely through muscularis propria.  Margins including radial, distal and proximal negative for tumor.   Ten lymph nodes negative for metastatic tumor.  No distal metastasis seen at surgery.  T&M code T2, N0, M0.  CONDITION UPON DISCHARGE:  Improved.  DISCHARGE MEDICATIONS: 1. Maxidone 1 to 2 every 4 to 6 hours p.r.n. for pain. 2. Flomax 0.4 mg 2 daily. 3. Norvasc 5 mg p.o. daily.  SPECIAL INSTRUCTIONS:  Leg bag is to remain.  Foley is to remain attached to his leg bag until seen next week by Dr. Brunilda Payor.  He has been given colostomy care instructions by stoma care nurse, and his wife and he are both able to take care of his colostomy.  WOUND CARE:  Keep wound clean with both perineal and abdominal with daily showers.  ACTIVITY:  Lifting is restricted to 20 pounds for the next six weeks.  DIET:  No restrictions.  HOSPITAL COURSE:  This patient is a 60 year old man with a several-month history of rectal  bleeding.  On flexible sigmoidoscopy, he was discovered to have a lesion approximately 2 cm from the anal verge which, on biopsy, showed carcinoma in situ.  On digital examination, the patient had a rather large exophytic lesion of the rectum.  Following discussion with him, he was brought to the operating room on the day of admission where he underwent abdominal perineal resection.  Postoperatively, he did well until his Foley catheter was removed.  Following this, he had a prolong urinary retention which did not resolve after the Flomax, and required the Foley catheter to be replaced on two occasions.  He was seen then in consultation by Dr. Su Grand, who started him on Flomax, but he continues to void only small amounts with large bladder residuals.  He is now being discharged home with a leg bag to be followed up by Dr. Brunilda Payor in one week.  He was also seen in consultation by Dr. Arline Asp and will be followed up by Dr. Arline Asp again in the near future.  At the time of discharge, the patient is afebrile, and vital signs are normal. Temperature this morning 97.6, pulse 112, respirations 18, blood pressure 145/89. Dictated by:   Mardene Celeste. Lurene Shadow, M.D. Attending Physician:  Sonda Primes DD:  03/05/02 TD:  03/06/02 Job: [id number]

## 2011-05-03 NOTE — Consult Note (Signed)
NAME:  Ryan Davila, Ryan Davila                        ACCOUNT NO.:  [id number]   MEDICAL RECORD NO.:  [id number]                   PATIENT TYPE:  REC   LOCATION:  TPC                                  FACILITY:  [facility]   PHYSICIAN:  Zachary George, DO                      DATE OF BIRTH:  10/25/1951   DATE OF CONSULTATION:  01/28/2003  DATE OF DISCHARGE:                                   CONSULTATION   Ryan Davila returns to the clinic today sooner than scheduled for re-  evaluation.  He was last seen on December 30, 2002.  He continues to have  coccygeal pain status post colorectal surgery for rectal cancer.  The  patient was previously doing well with sparing use of Lortab 7.5 mg 1/2 of a  pill to one pill up to two times a day as needed.  Over the past few weeks,  however, his pain has increased significantly.  He attributes this to  increasing his work load.  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  His pain got bad  enough where he had to take a few days off from work.  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  He did get a  cushion for his seat, but he states it really did not improve his symptoms.  He denies any lower back pain and denies any radicular symptoms at this  time.  Again, his pain is located in his coccygeal region.  The pain today  is an 8 out of 10 on a subjective scale.  His function and quality of life  indices have declined.  His sleep is fair.  The pain is described as achy,  constant with some tingling.  I reviewed the health and history form and 14-  point review of systems.   PHYSICAL EXAMINATION:  GENERAL:  Reveals a healthy male in no acute  distress.   VITAL SIGNS:  Blood pressure 152/75, pulse 61, respirations 20, and O2  saturations 94% on room air.   PALPATORY EXAMINATION:  Reveals point of maximum tenderness over the coccyx.  There is no tenderness to palpation along the lumbar paraspinal muscles.  Range of motion of the lumbar spine is full.  Manual muscle testing is 5/5  bilateral lower extremities.  Sensory exam is intact to light touch  bilateral left extremities.  Muscle stretch reflexes are symmetric bilateral  left extremities.   IMPRESSION:  1. Coccydynia.  2. Low back pain, intermittent, without lower extremity radicular symptoms     at this time.   PLAN:  1. Ryan Davila and  I had a thorough conversation in regards to further     treatment options.  We discussed potential work strategies.  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  However, he cannot take narcotic-based pain medications and     drive commercially, nor to I recommend it.  At this time, I would like to     get him into physical therapy for myofascial release techniques,     modalities, and assistance with return to work strategies 2-3 times per     week for four weeks.  I will keep him off work for one month.  I     discussed possibly looking for a different type of job where he is not     driving for such long distances, possibly short distances around town,     but he does not feel there is employment in that regard.  Would consider     vocational rehabilitation.  2.     Continue Lortab 7.5 mg/500 mg 1/2 to 1 p.o. t.i.d. as needed.  3. The patient is to return to the clinic in one month for re-evaluation.   The patient was educated about findings and recommendations and understands.  No barriers to communication.                                               Zachary George, DO    JW/MEDQ  D:  01/28/2003  T:  01/28/2003  Job:  [id number]

## 2011-05-03 NOTE — Discharge Summary (Signed)
NAME:  Ryan Davila, Ryan Davila                        ACCOUNT NO.:  [id number]   MEDICAL RECORD NO.:  [id number]                   PATIENT TYPE:  INP   LOCATION:  0355                                 FACILITY:  [facility]   PHYSICIAN:  Rene Paci, M.D. [facility]          DATE OF BIRTH:  January 17, 1951   DATE OF ADMISSION:  05/08/2003  DATE OF DISCHARGE:  05/10/2003                                 DISCHARGE SUMMARY   DISCHARGE DIAGNOSES:  1. Chest pain, rule out myocardial infarction.  No evidence of ischemia or     arrhythmia.  Adenosine Cardiolite results pending at time of discharge.  2. Chronic pain secondary to history of rectal cancer and surgeries followed     by Dr. Vear Clock.  3. History of hypertension.  4. Mild dehydration with hyponatremia, resolved.  5. Hypokalemia, resolved.  6. Dyslipidemia with hypertriglyceridemia and low HDL.  Initiated Niaspan.     Normal LFTs.   DISCHARGE MEDICATIONS:  1. Niaspan 500 mg p.o. q.h.s.  2. Baby aspirin 81 mg p.o. daily.  3. Methadone 5 mg p.o. q.i.d. or as per Dr. Vear Clock' guidance.  4. Xalatan eye drops one both eyes q.h.s.  5. Benicar 40 mg p.o. daily.   The patient is instructed to discontinue tobacco abuse.  He is also to call  his primary care physician, Dr. Jonny Ruiz for hospital follow-up in two to three  weeks.  A repeat fasting lipid and LFTs will need to be remonitored in six  to eight weeks to see if Niaspan is benefiting his dyslipidemia profile.   HOSPITAL COURSE:  Problem 1 - DYSPNEA ON EXERTION/SHORTNESS OF BREATH:  The  patient is a pleasant 60 year old gentleman with no prior history of  coronary disease, but risk factors including hypertension, tobacco abuse,  and positive family history who presented to the emergency room complaining  of two days of increasing shortness of breath with exertion.  He had no  specific chest pain, but a vague discomfort within his chest.  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  [facility] secondary to chronic pain in his tailbone  related to history of rectal cancer and its corrective surgeries.  He was  admitted to telemetry for rule out MI.  His cardiac enzymes were negative  and cardiology was consulted to perform a 2-D echocardiogram and Adenosine  Cardiolite.  These were scheduled and run and initial images were negative  for any changes or ischemia.  Final results are pending at time of discharge  but it is anticipated these will be negative.  He is thereby discharged home  to continue medical management of his risk factors including hypertensive  control with Benicar, aspirin daily, and initiation of Niaspan for his  dyslipidemia.  See problem number next.  He  is also encouraged to quit  tobacco abuse and to keep follow-up with his primary care physician as  scheduled.   Problem 2 - DYSLIPIDEMIA:  Fasting lipid profile was performed which showed  a good overall total cholesterol of 196.  There was an elevated triglyceride  of 199 and a low HDL in the 30s.  He was initiated on Niaspan low dose to be  taken at bedtime.  A repeat LFT and lipid profile will be checked by his  primary care physician at an appropriate time in the future.   Problem 3 - ALL OTHER CHRONIC MEDICAL ISSUES SUCH AS CHRONIC PAIN, HISTORY  OF HYPERTENSION:  Well controlled during this hospitalization.  He was  continued on his home medications without changes.                                               Rene Paci, M.D. [facility]    VL/MEDQ  D:  05/10/2003  T:  05/10/2003  Job:  [id number]

## 2011-05-03 NOTE — Consult Note (Signed)
NAMEELICK, AGUILERA NO.:  [id number]   MEDICAL RECORD NO.:  [id number]                   PATIENT TYPE:   LOCATION:                                       FACILITY:   PHYSICIAN:  Zachary George, DO                      DATE OF BIRTH:  [birth year]   DATE OF CONSULTATION:  11/29/2002  DATE OF DISCHARGE:                                   CONSULTATION   HISTORY OF PRESENT ILLNESS:  The patient returns to clinic today for  reevaluation.  He was last seen on October 18, 2002.  At that time, he  underwent a trigger point injection to the coccygeal soft tissue with  significant relief during the anesthetic phase.  He had essentially no  response to the steroid phase.  He continues on Lortab 7.5 mg one half to  one p.o. b.i.d. as needed which allows him to continue working as well as  functioning at home.  He continues to have decline in function, however,  primarily with walking as he notes a pulling sensation onto his coccygeal  region.  He also describes some pain in his lower back and occasional  radiation of pain into his buttocks bilaterally.  His pain today is an 8/10  on subjective scale.  I reviewed the health and history form and 14-point  review of systems.  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   PHYSICAL EXAMINATION:  GENERAL APPEARANCE:  A healthy male in no acute  distress.  VITAL SIGNS:  Blood pressure is 154/79, pulse 95, respiratory rate 18, O2  saturation is 99% on room air.  EXTREMITIES:  The patient has some difficulty getting up from a chair and  onto the examination table secondary to pain which he describes in his  coccygeal region.  Palpatory examination reveals tenderness to palpation  over the coccyx as well as mildly over the lumbar paraspinal muscles.  NEUROLOGIC:  Intact in the lower extremities including motor, sensory and  reflexes.  Straight leg raising and Pearlean Brownie are  negative bilaterally but the  patient has tight hamstrings and hip flexors which tend to put tension on  the coccygeal region.   I reviewed radiologic reports which include CT of the abdomen with contast.  According to the report, the rectum and previously demonstrated rectal mass  are surgically absent.  There was mild presacral soft tissue stranding  demonstrated.  I am not quite sure what this means.  It is possible that  this is scarring from the surgical procedure.   IMPRESSION:  1. Coccydynia.  2. Low back pain with occasional radiation into the buttocks and thighs     bilaterally, neurologically intact.   PLAN:  1. Instruct the patient on stretching techniques for hamstrings and hip     flexors  which I think will give him more flexibility and hopefully     improve his ability to walk longer distances.  2. Continue Lortab 7.5 mg one half to one p.o. b.i.d. as needed, #60 without     refills.  3. If the patient's symptoms are not improving, would consider MRI of the     lumbar spine to assess disc facets and nerve roots.  4. The patient is to return to the clinic in one month for reevaluation.   The patient was educated on the above findings and recommendations and  understands.  There were no barriers to communication.                                               Zachary George, DO    JW/MEDQ  D:  11/29/2002  T:  11/29/2002  Job:  [id number]   cc:   Corwin Levins, M.D. [facility]

## 2011-05-03 NOTE — Consult Note (Signed)
NAME:  Ryan Davila                        ACCOUNT NO.:  [id number]   MEDICAL RECORD NO.:  [id number]                   PATIENT TYPE:  REC   LOCATION:  TPC                                  FACILITY:  [facility]   PHYSICIAN:  Ryan Davila, D.O.                 DATE OF BIRTH:  Feb 10, 1951   DATE OF CONSULTATION:  09/15/2002  DATE OF DISCHARGE:                                   CONSULTATION   Dear Dr. Jonny Davila:   Thank you very much for kindly referring [facility].  Ryan Davila was seen in the  clinic today.  Please refer to the following for details regarding the  history and physical examination and treatment plan.  Once again, thank you  for allowing [place] to participate in the care of Ryan Davila.   CHIEF COMPLAINT:  Pain in the tailbone.   HISTORY OF PRESENT ILLNESS:  Ryan Davila is a pleasant 60 year old male who  works for [employer] as a Naval architect who complains of greater than six-  month history of pain in his tailbone pointing to his coccyx.  He states  that he was diagnosed with rectal cancer and underwent resection on February 15, 2002, with colostomy.  He has had some pain in his coccygeal region ever  since the surgery and has been treated by his surgeon with hydrocodone.  Currently he takes hydrocodone 7.5 mg one and a half to two pills per day.  The patient states that typically he breaks the pill in half and takes it  three to four times per day as needed for severe pain.  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  He states  that his pain is bad enough that he cannot sit for very long periods of time  and he cannot stand for very long periods of time.  The medication seems to  help him. He states that his surgery, Dr. Lurene Davila, has discussed weaning him  from the hydrocodone but he is somewhat reluctant as it helping him.  He  states that he has tried other  medications including Ultracet and Darvocet  which did not seem to help.  He does not believe that he has been on any  nonsteroidal anti-inflammatory medications.  Currently his pain is a 9/10 on  subjective scale and described as constant, achy, with associated numbness  and tingling in that region.  He denies any pain in his lumbar region.  His  function and quality of life indices have declined to some degree.  His  sleep is unaffected.  His symptoms are worse with sitting, working and  prolonged standing and improved with medications.  The patient does not have  any cognitive effect of the medications, in fact he states that it keeps him  awake and he cannot take it prior to going to bed.  He has not had any  difficulties maintaining his current job with [employer] despite taking pain  medications.  He states that his superiors at work are aware that he takes  the pain medicine.  He has recently undergone a urine drug screen at work  which he states was normal.  I reviewed the health and history form and 14-  point review of systems.  The patient denies any radicular symptoms.  He  denies any fevers, chills, night sweats, weight loss or bowel and bladder  dysfunction.  He does use a colostomy and does have some constipation  intermittently.  He has been told by his surgeon that all of the malignancy  has been removed including some lymph nodes.   PAST MEDICAL HISTORY:  Colon cancer and hypertension.   PAST SURGICAL HISTORY:  Hand surgery, tonsillectomy, appendectomy, shoulder  surgery and rectal surgery.   FAMILY HISTORY:  This is positive for heart disease, cancer, diabetes,  disability, hypertension.   SOCIAL HISTORY:  The patient denies smoking and illicit drug use.  He admits  to rare use of alcohol.  He is married and continues to work for [employer] and states that he does not really like his job but he wishes to  continue working.   ALLERGIES:  No known drug  allergies.   MEDICATIONS:  Allegra, Xalatan, Benicar and hydrocodone.   PHYSICAL EXAMINATION:  GENERAL APPEARANCE:  The patient is a healthy-  appearing male in no acute distress.  VITAL SIGNS:  Blood pressure 180/88, pulse 88, respiratory rate 20, O2  saturation 97% on room air.  BACK:  Examination reveals a level pelvis without scoliosis. There is normal  lumbar lordosis.  There is no tenderness to palpation in the lumbar  paraspinous region. There is some mild tenderness to palpation over the  coccyx.  Range of motion of the lumbar spine is full without pain.  NEUROLOGICAL:  Manual muscle testing is 5/5 bilateral lower extremities.  Sensory examination is intact to light touch bilateral lower extremities.  Muscle stretch reflexes are 2+/4 bilateral patellar, medial hamstrings and  Achilles.  Straight leg raising is negative bilaterally.  Pearlean Brownie is negative  bilaterally.  The patient has tight hamstrings and hip flexors bilaterally.  There is no heat, erythema, or edema in the lower extremities.   IMPRESSION:  Coccydynia.   PLAN:  1. I had a long discussion with Ryan Davila regarding treatment options and     what to expect when being treated in our clinic.  We discussed     medications and other alternatives to try to help his pain.  Initially I     would like to get him involved in a physical therapy program for range of     motion, stretching, and manual therapy as well as evaluation for seating     strategies.  Home exercise program to be provided.  This would be two to     three times a week for four weeks.  2. Trial of Bextra 20 mg daily #15 samples provided.  3. Consideration of Lidoderm 5% patches to be used up to 12 hours per day.     I have provided him with two sample patches.  4. Consider local steroid injection in the pericoccygeal region if symptoms     are not improving. 5. Will continue on hydrocodone 7.5 mg one and a half to two pills  per day     as needed.  The  patient has 10 pills remaining with a refill on his     previous prescription.  We discussed ultimately trying to wean him from     this medication if the above noted measures are successful.  If not, I do     not see any reason why he cannot continue at least for now with the     current medication regimen of hydrocodone as he is using this sparingly.     He does not reveal any history of substance abuse and does not appear to     have any cognitive deficits from the medication.  There is ongoing     controversy in regard to driving on narcotic based pain medication and     there have been several studies to refute this; however, it might be in     his best interest ultimately to Davila off of the hydrocodone and look to     non-narcotic alternatives.  6. The patient is to return to clinic in two weeks for reevaluation.  7. Follow-up with primary care physician.   The patient was educated on the above findings and recommendations and  understands.  There were no barriers to communication.                                                Ryan Davila, D.O.   JJW/MEDQ  D:  09/15/2002  T:  09/15/2002  Job:  [id number]   cc:   Corwin Levins, M.D. [facility]

## 2011-05-03 NOTE — Consult Note (Signed)
[place]. [facility]  Patient:    Ryan Davila, Ryan Davila Visit Number: [id number] MRN: [id number]          Service Type: SUR Location: 5700 [id number] [id number] Attending Physician:  Sonda Primes Dictated by:   Lindaann Slough, M.D. Proc. Date: 03/02/02 Admit Date:  02/23/2002   CC:         Luisa Hart L. Lurene Shadow, M.D.   Consultation Report  REASON FOR CONSULTATION:  Inability to urinate.  HISTORY OF PRESENT ILLNESS:  The patient is a 60 year old male four days status post abdominoperineal resection for carcinoma of the rectum.  He has been having difficulty voiding after removal of the Foley catheter.  He was in-and-out catheterization for about 800 cc and then Foley catheter was reinserted in the bladder.  The patient states that in the 1980s he had difficulty voiding after an appendectomy.  He has not had any voiding symptoms since.  PAST MEDICAL HISTORY:  He has a history of hypertension, glaucoma, asthma, hepatitis, hyperlipidemia, and hiatal hernia.  He also had appendectomy in the 1980s, left rotator cuff repair, right hand ligament repair, T&A as a child, and cyst on the neck removed.  ALLERGIES:  He has no known drug allergies.  MEDICATIONS: 1. Xalatan 1 drop each eye daily. 2. Allegra 60 mg b.i.d. 3. Aller-Chlor 1 daily.  PHYSICAL EXAMINATION:  ABDOMEN:  He has no CVA tenderness.  Kidneys are not palpable.  Bladder is not distended.  GENITALIA:  Penis is uncircumcised.  He has an indwelling Foley catheter that is draining clear urine.  Scrotum is unremarkable.  Testicles, cords, and epididymides are within normal limits.  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  SUGGESTION:  Start Flomax 0.4 mg p.o. daily and start a voiding trial.  Thank you.  We will follow the patient with you. Dictated by:   Lindaann Slough, M.D. Attending Physician:   Sonda Primes DD:  03/02/02 TD:  03/03/02 Job: [id number]

## 2011-05-03 NOTE — Consult Note (Signed)
NAME:  Ryan Davila, Ryan Davila                        ACCOUNT NO.:  [id number]   MEDICAL RECORD NO.:  [id number]                   PATIENT TYPE:  REC   LOCATION:  [facility]                                  FACILITY:  [facility]   PHYSICIAN:  Sondra Come, D.O.                 DATE OF BIRTH:  [birth year]   DATE OF CONSULTATION:  10/18/2002  DATE OF DISCHARGE:                                   CONSULTATION   HISTORY OF PRESENT ILLNESS:  The patient returns to clinic today as  scheduled for reevaluation.  He was last seen on September 29, 2002.  In the  interim, he has had fluctuating pain in his coccygeal region and has been  taking Lortab very sparingly, sometimes only one per day and occasionally up  to two per day but no more than that.  The medication does relieve his pain  significantly.  His pain today, however, is a 6/10 on a subjective scale.  Function and quality of life indices have remained declined overall and his  sleep has been fair to poor.  I suspect the decline in his function and  quality of life is multifactorial status post colorectal surgery with  colostomy, as well as persistent coccygeal pain, although relieved to a fair  degree with hydrocodone.  The patient is taking medication very sparingly  and appropriately.  We have discussed other options to try to relieve his  pain to include local trigger point injections in the coccygeal region and  the patient wishes to proceed with this.  I reviewed the health and history  form and 14-point review of systems.  No new neurologic complaints.   PHYSICAL EXAMINATION:  GENERAL:  A healthy male in no acute distress.  VITAL SIGNS:  Blood pressure 149/81, pulse 74, respirations 16.  O2  saturation 100% on room air.  MUSCULOSKELETAL:  Palpatory examination reveals significant tenderness to  palpation over the coccyx and coccygeal soft tissues.   IMPRESSION:  Coccydynia.   PLAN:  1. Trigger point injection to the coccygeal soft tissue.  2.  Continue Lortab 7.5 mg 1/2 to 1 p.o. b.i.d. as needed.  The patient has     plenty pills remaining.  3. Await records from [facility] in regards to the patient's surgery.  4. The patient to return to clinic in one month or sooner as needed.   PROCEDURE:  Trigger point injection, coccygeal soft tissue.  The procedure  was described to the patient in detail including risks, benefits,  limitations, and alternatives.  The patient wishes to proceed.  Informed  consent was obtained.  The skin was prepped in the usual fashion with  alcohol swabs.  Coccygeal soft tissue was injected with 1 cc of Kenalog 40  mg/cc plus 2 cc of 1% lidocaine using a 25-gauge 1-1/4 inch needle.  The  patient tolerated the procedure well.  Discharge instructions given.  The  patient released in stable condition.   The patient was educated on the above findings and recommendations and  understands.  There were no barriers to communication.                                               Sondra Come, D.O.   JJW/MEDQ  D:  10/18/2002  T:  10/18/2002  Job:  [id number]   cc:   Corwin Levins, M.D. [facility]

## 2011-05-09 ENCOUNTER — Other Ambulatory Visit: Payer: Self-pay | Admitting: Physician Assistant

## 2011-05-09 ENCOUNTER — Encounter (HOSPITAL_BASED_OUTPATIENT_CLINIC_OR_DEPARTMENT_OTHER): Payer: Medicare Other | Admitting: Oncology

## 2011-05-09 DIAGNOSIS — Z933 Colostomy status: Secondary | ICD-10-CM

## 2011-05-09 DIAGNOSIS — C20 Malignant neoplasm of rectum: Secondary | ICD-10-CM

## 2011-05-09 DIAGNOSIS — C787 Secondary malignant neoplasm of liver and intrahepatic bile duct: Secondary | ICD-10-CM

## 2011-05-09 LAB — CBC WITH DIFFERENTIAL/PLATELET
BASO%: 0.3 % (ref 0.0–2.0)
Eosinophils Absolute: 0.2 10*3/uL (ref 0.0–0.5)
HCT: 41 % (ref 38.4–49.9)
LYMPH%: 33.8 % (ref 14.0–49.0)
MONO#: 0.5 10*3/uL (ref 0.1–0.9)
NEUT#: 3.5 10*3/uL (ref 1.5–6.5)
NEUT%: 55.2 % (ref 39.0–75.0)
Platelets: 218 10*3/uL (ref 140–400)
RBC: 4.58 10*6/uL (ref 4.20–5.82)
WBC: 6.4 10*3/uL (ref 4.0–10.3)
lymph#: 2.2 10*3/uL (ref 0.9–3.3)

## 2011-05-09 LAB — CEA: CEA: 0.9 ng/mL (ref 0.0–5.0)

## 2011-05-09 LAB — COMPREHENSIVE METABOLIC PANEL
Alkaline Phosphatase: 66 U/L (ref 39–117)
BUN: 12 mg/dL (ref 6–23)
CO2: 23 mEq/L (ref 19–32)
Glucose, Bld: 129 mg/dL — ABNORMAL HIGH (ref 70–99)
Total Bilirubin: 0.5 mg/dL (ref 0.3–1.2)
Total Protein: 6.6 g/dL (ref 6.0–8.3)

## 2011-05-09 LAB — LACTATE DEHYDROGENASE: LDH: 127 U/L (ref 94–250)

## 2011-09-05 ENCOUNTER — Other Ambulatory Visit: Payer: Self-pay

## 2011-09-05 MED ORDER — LOVASTATIN 40 MG PO TABS: 40.0000 mg | ORAL_TABLET | Freq: Every day | ORAL | Status: DC

## 2011-10-02 ENCOUNTER — Other Ambulatory Visit: Payer: Self-pay

## 2011-10-02 MED ORDER — AMLODIPINE-OLMESARTAN 5-40 MG PO TABS: 1.0000 | ORAL_TABLET | Freq: Every day | ORAL | Status: DC

## 2012-02-04 DIAGNOSIS — C787 Secondary malignant neoplasm of liver and intrahepatic bile duct: Secondary | ICD-10-CM | POA: Diagnosis not present

## 2012-02-10 DIAGNOSIS — F329 Major depressive disorder, single episode, unspecified: Secondary | ICD-10-CM | POA: Diagnosis not present

## 2012-02-10 DIAGNOSIS — G894 Chronic pain syndrome: Secondary | ICD-10-CM | POA: Diagnosis not present

## 2012-02-10 DIAGNOSIS — F3289 Other specified depressive episodes: Secondary | ICD-10-CM | POA: Diagnosis not present

## 2012-02-10 DIAGNOSIS — K6289 Other specified diseases of anus and rectum: Secondary | ICD-10-CM | POA: Diagnosis not present

## 2012-04-01 ENCOUNTER — Other Ambulatory Visit: Payer: Self-pay | Admitting: Internal Medicine

## 2012-04-06 DIAGNOSIS — K6289 Other specified diseases of anus and rectum: Secondary | ICD-10-CM | POA: Diagnosis not present

## 2012-04-06 DIAGNOSIS — G894 Chronic pain syndrome: Secondary | ICD-10-CM | POA: Diagnosis not present

## 2012-04-10 ENCOUNTER — Telehealth: Payer: Self-pay | Admitting: Oncology

## 2012-04-10 NOTE — Telephone Encounter (Signed)
Changed time of 5/23 appt to 9 am. S/w wife she is aware.

## 2012-04-23 DIAGNOSIS — H409 Unspecified glaucoma: Secondary | ICD-10-CM | POA: Diagnosis not present

## 2012-04-23 DIAGNOSIS — H4011X0 Primary open-angle glaucoma, stage unspecified: Secondary | ICD-10-CM | POA: Diagnosis not present

## 2012-05-07 ENCOUNTER — Encounter: Payer: Self-pay | Admitting: Oncology

## 2012-05-07 ENCOUNTER — Ambulatory Visit (HOSPITAL_BASED_OUTPATIENT_CLINIC_OR_DEPARTMENT_OTHER): Payer: Medicare Other | Admitting: Oncology

## 2012-05-07 ENCOUNTER — Ambulatory Visit (HOSPITAL_BASED_OUTPATIENT_CLINIC_OR_DEPARTMENT_OTHER): Payer: Medicare Other

## 2012-05-07 VITALS — BP 157/93 | HR 96 | Temp 97.4°F | Ht 74.0 in | Wt 241.0 lb

## 2012-05-07 DIAGNOSIS — C20 Malignant neoplasm of rectum: Secondary | ICD-10-CM | POA: Diagnosis not present

## 2012-05-07 DIAGNOSIS — Z85038 Personal history of other malignant neoplasm of large intestine: Secondary | ICD-10-CM | POA: Diagnosis not present

## 2012-05-07 LAB — COMPREHENSIVE METABOLIC PANEL
ALT: 31 U/L (ref 0–53)
AST: 24 U/L (ref 0–37)
CO2: 26 mEq/L (ref 19–32)
Calcium: 9.5 mg/dL (ref 8.4–10.5)
Chloride: 105 mEq/L (ref 96–112)
Sodium: 141 mEq/L (ref 135–145)
Total Bilirubin: 0.5 mg/dL (ref 0.3–1.2)
Total Protein: 6.8 g/dL (ref 6.0–8.3)

## 2012-05-07 LAB — CBC WITH DIFFERENTIAL/PLATELET
BASO%: 0.4 % (ref 0.0–2.0)
Eosinophils Absolute: 0.2 10*3/uL (ref 0.0–0.5)
MONO#: 0.8 10*3/uL (ref 0.1–0.9)
NEUT#: 5 10*3/uL (ref 1.5–6.5)
RBC: 4.66 10*6/uL (ref 4.20–5.82)
RDW: 12.7 % (ref 11.0–14.6)
WBC: 8.5 10*3/uL (ref 4.0–10.3)
nRBC: 0 % (ref 0–0)

## 2012-05-07 LAB — LACTATE DEHYDROGENASE: LDH: 139 U/L (ref 94–250)

## 2012-05-07 NOTE — Progress Notes (Signed)
This office note has been dictated.  #[id number]

## 2012-05-08 NOTE — Progress Notes (Signed)
CC:   Corwin Levins, MD Maryln Gottron, M.D. Barbette Hair. Arlyce Dice, MD,FACG Mark L. Vear Clock, M.D. [facility], Fax [phone] Loyce Dys, MD  PROBLEM LIST: 1. 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  Tumor was T2 N0, i.e., stage I, with a 3.5 cm primary,     negative margins and all 10 lymph nodes negative.  The patient     underwent an AP resection and did well without evidence of disease     until he had a solitary recurrence in his liver in November 2007.     He underwent preoperative chemotherapy with FOLFOX for 3 cycles     from 10/31/2006 through December 2007.  He then underwent hepatic     resection at [facility] on 01/15/2007.  He received additional     chemotherapy consisting of FOLFOX and Avastin from 02/19/2007     through June 2008.  The patient has remained disease-free since     that time.  He is seen on regular basis at [facility] by Dr. Jola Babinski every     6 months.  He underwent CT scans there in February of this year and     on August 06, 2011, of last year.  He also has undergone a     colonoscopy by Dr. Melvia Heaps on 01/24/2011.  That exam was     negative. 2. Chronic pain syndrome. 3. Permanent colostomy. 4. Hypertension. 5. Dyslipidemia. 6. Glaucoma. 7. History of asthma. 8. History of depression. 9. Positive family history of colon cancer in the patient's paternal     grandfather.  MEDICATIONS: 1. Albuterol inhaler 4 times daily. 2. Azor 1 tablet daily. 3. Xalatan 0.005% ophthalmic solution, use as directed. 4. Lovastatin 40 mg daily. 5. Methadone 5 mg daily. 6. Remeron 15 mg at bedtime.  HISTORY:  Ryan Davila was seen today for followup of his previous history of metastatic rectal cancer, status post chemotherapy and resection of a solitary liver metastasis on 01/15/2007.  The patient has remained disease-free since that time and is followed regularly at [facility]. He is without any new complaints today.  He  was last seen by [place] on 05/09/2011.  He was recently seen at [facility] by Dr. Jola Babinski in February with CT scans that were negative.  Last colonoscopy was by Dr. Melvia Heaps on 01/24/2011.  That exam was negative.  As stated, the patient is without complaints, feels generally well.  He is here today with his wife, Tamela Oddi.  PHYSICAL EXAMINATION:  Velma looks well.  He has gained weight over the last few years.  Current weight is 241 pounds.  Height 62 inches, body surface area 2.39 sq. m.  Blood pressure 157/93.  Other vital signs are normal.  There is no scleral icterus.  Mouth and pharynx are benign.  No peripheral adenopathy palpable.  Heart and lungs:  Normal.  Abdomen: Benign with no organomegaly or masses palpable.  He has a permanent colostomy in the left lower abdomen.  Extremities:  No peripheral edema. A right-sided Port-A-Cath which had been present was removed on 02/22/2010.  LABORATORY DATA:  Laboratory data from today, consisting of CBC, chemistries and CEA. Are pending.  Chemistries from 05/09/2011 were normal except for a glucose of 129.  CBC was normal on 05/09/2011.  CEA was 0.9.  IMAGING STUDIES:  Imaging studies had been carried out at [facility] with CT scans and indicate no evidence of disease.  The patient and  his wife will try to get [place] those reports.  IMPRESSION/PLAN:  It has now been over 10 years from the time of the original diagnosis and 5-1/2 years since the diagnosis of the recurrence in the liver.  The patient remains disease-free.  We had seen him a year ago on 05/09/2011.  I told the patient that we would be happy to see him again in the future should any questions or problems arise but I did not really think it was necessary for [place] to continue to see him at this time.  He will continue to be followed at [facility].  It has been a pleasure to participate in the patient's care, and I am delighted at how well he has done.    ______________________________ Samul Dada, M.D. DSM/MEDQ  D:  05/07/2012  T:  05/07/2012  Job:  [id number]

## 2012-05-13 ENCOUNTER — Ambulatory Visit: Payer: Medicare Other | Admitting: Internal Medicine

## 2012-05-13 ENCOUNTER — Telehealth: Payer: Self-pay | Admitting: Internal Medicine

## 2012-05-13 NOTE — Telephone Encounter (Signed)
Caller: Elizabeth/Spouse; PCP: Oliver Barre; CB#: [phone]; Call regarding Tick Bite;  Removed small (deer?) tick from under L breast 05/10/12.  Afebrile. Head broke off but wife was able to remove it. Elongated area of redness approx 3-4."  Advised to see MD within 4 hrs for history of tick bite and now has rash and joint pain per Bites and Stings-Insect or Spiders Guideline.  No appt remain with PCP.  Appt scheduled for next available appt in [place] office 05/13/12 at 1545 with Dr Sanda Linger.

## 2012-06-01 DIAGNOSIS — K6289 Other specified diseases of anus and rectum: Secondary | ICD-10-CM | POA: Diagnosis not present

## 2012-06-01 DIAGNOSIS — G894 Chronic pain syndrome: Secondary | ICD-10-CM | POA: Diagnosis not present

## 2012-07-03 ENCOUNTER — Other Ambulatory Visit: Payer: Self-pay | Admitting: Internal Medicine

## 2012-07-27 DIAGNOSIS — G894 Chronic pain syndrome: Secondary | ICD-10-CM | POA: Diagnosis not present

## 2012-07-27 DIAGNOSIS — K6289 Other specified diseases of anus and rectum: Secondary | ICD-10-CM | POA: Diagnosis not present

## 2012-08-06 ENCOUNTER — Ambulatory Visit (INDEPENDENT_AMBULATORY_CARE_PROVIDER_SITE_OTHER): Payer: Medicare Other | Admitting: Internal Medicine

## 2012-08-06 ENCOUNTER — Other Ambulatory Visit (INDEPENDENT_AMBULATORY_CARE_PROVIDER_SITE_OTHER): Payer: Medicare Other

## 2012-08-06 ENCOUNTER — Encounter: Payer: Self-pay | Admitting: Internal Medicine

## 2012-08-06 VITALS — BP 130/90 | HR 106 | Temp 97.9°F | Ht 74.0 in | Wt 244.1 lb

## 2012-08-06 DIAGNOSIS — R7309 Other abnormal glucose: Secondary | ICD-10-CM

## 2012-08-06 DIAGNOSIS — E785 Hyperlipidemia, unspecified: Secondary | ICD-10-CM

## 2012-08-06 DIAGNOSIS — R7302 Impaired glucose tolerance (oral): Secondary | ICD-10-CM

## 2012-08-06 DIAGNOSIS — N320 Bladder-neck obstruction: Secondary | ICD-10-CM | POA: Diagnosis not present

## 2012-08-06 DIAGNOSIS — I10 Essential (primary) hypertension: Secondary | ICD-10-CM | POA: Diagnosis not present

## 2012-08-06 DIAGNOSIS — L989 Disorder of the skin and subcutaneous tissue, unspecified: Secondary | ICD-10-CM | POA: Diagnosis not present

## 2012-08-06 LAB — CBC WITH DIFFERENTIAL/PLATELET
Basophils Absolute: 0 10*3/uL (ref 0.0–0.1)
Eosinophils Absolute: 0.1 10*3/uL (ref 0.0–0.7)
Lymphocytes Relative: 30.7 % (ref 12.0–46.0)
MCHC: 34.1 g/dL (ref 30.0–36.0)
Monocytes Absolute: 0.6 10*3/uL (ref 0.1–1.0)
Neutrophils Relative %: 59.1 % (ref 43.0–77.0)
Platelets: 253 10*3/uL (ref 150.0–400.0)
RBC: 4.81 Mil/uL (ref 4.22–5.81)
RDW: 12.9 % (ref 11.5–14.6)

## 2012-08-06 LAB — URINALYSIS, ROUTINE W REFLEX MICROSCOPIC
Bilirubin Urine: NEGATIVE
Hgb urine dipstick: NEGATIVE
Nitrite: NEGATIVE
Urobilinogen, UA: 0.2 (ref 0.0–1.0)

## 2012-08-06 LAB — LIPID PANEL
Cholesterol: 170 mg/dL (ref 0–200)
HDL: 39 mg/dL — ABNORMAL LOW (ref >39.00)
Triglycerides: 367 mg/dL — ABNORMAL HIGH (ref 0.0–149.0)
VLDL: 73.4 mg/dL — ABNORMAL HIGH (ref 0.0–40.0)

## 2012-08-06 LAB — BASIC METABOLIC PANEL
Calcium: 9.3 mg/dL (ref 8.4–10.5)
Creatinine, Ser: 0.9 mg/dL (ref 0.4–1.5)
GFR: 88.15 mL/min (ref >60.00)
Glucose, Bld: 113 mg/dL — ABNORMAL HIGH (ref 70–99)
Sodium: 135 mEq/L (ref 135–145)

## 2012-08-06 LAB — HEPATIC FUNCTION PANEL
Albumin: 3.9 g/dL (ref 3.5–5.2)
Alkaline Phosphatase: 57 U/L (ref 39–117)

## 2012-08-06 LAB — TSH: TSH: 1.42 u[IU]/mL (ref 0.35–5.50)

## 2012-08-06 MED ORDER — MIRTAZAPINE 15 MG PO TABS: 15.0000 mg | ORAL_TABLET | Freq: Every day | ORAL | Status: DC

## 2012-08-06 MED ORDER — ALBUTEROL SULFATE HFA 108 (90 BASE) MCG/ACT IN AERS: 2.0000 | INHALATION_SPRAY | Freq: Four times a day (QID) | RESPIRATORY_TRACT | Status: DC | PRN

## 2012-08-06 MED ORDER — LOVASTATIN 40 MG PO TABS: 40.0000 mg | ORAL_TABLET | Freq: Every day | ORAL | Status: DC

## 2012-08-06 MED ORDER — AMLODIPINE-OLMESARTAN 5-40 MG PO TABS: 1.0000 | ORAL_TABLET | Freq: Every day | ORAL | Status: DC

## 2012-08-06 NOTE — Progress Notes (Signed)
Subjective:    Patient ID: Ryan Davila, male    DOB: [birth year], 61 y.o.   MRN: [id number]  HPI  Here to f/u; overall doing ok,  Pt denies chest pain, increased sob or doe, wheezing, orthopnea, PND, increased LE swelling, palpitations, dizziness or syncope.  Pt denies new neurological symptoms such as new headache, or facial or extremity weakness or numbness   Pt denies polydipsia, polyuria, or low sugar symptoms such as weakness or confusion improved with po intake.  Pt states overall good compliance with meds, trying to follow lower cholesterol, diabetic diet, wt overall stable but little exercise however.  Quit smoking 1989.  Does have skin lesion to scalp top of head with mild increased size and black.  Past Medical History  Diagnosis Date  . Impaired glucose tolerance 04/23/2011  . COLON CANCER, HX OF 04/18/2008  . ALLERGIC RHINITIS 04/18/2008  . DEPRESSION 04/18/2008  . GERD 04/18/2008  . HYPERLIPIDEMIA 04/18/2008  . HYPERTENSION 04/18/2008  . HEPATITIS B, HX OF 04/18/2008   Past Surgical History  Procedure Date  . Appendectomy   . Rotator cuff surgery   . Tonsillectomy   . Repair flexor tendon hand   . Rectal surgery 2003    AP resection  . Liver surgery     partial liver due to metastasis/ then CMT    reports that he has quit smoking. He does not have any smokeless tobacco history on file. He reports that he drinks alcohol. He reports that he does not use illicit drugs. family history includes Breast cancer in his maternal aunt; Colon cancer in his maternal grandfather; Diabetes in his father and sister; and Prostate cancer in his maternal uncle. No Known Allergies Current Outpatient Prescriptions on File Prior to Visit  Medication Sig Dispense Refill  . albuterol (PROAIR HFA) 108 (90 BASE) MCG/ACT inhaler Inhale 2 puffs into the lungs every 6 (six) hours as needed for wheezing.  1 Inhaler  11  . amLODipine-olmesartan (AZOR) 5-40 MG per tablet Take 1 tablet by mouth daily.  90 tablet   3  . lovastatin (MEVACOR) 40 MG tablet Take 1 tablet (40 mg total) by mouth daily.  90 tablet  3  . methadone (DOLOPHINE) 5 MG tablet 5 tablets every day      . mirtazapine (REMERON) 15 MG tablet Take 1 tablet (15 mg total) by mouth at bedtime.  90 tablet  3   Review of Systems Review of Systems  Constitutional: Negative for diaphoresis and unexpected weight change.  HENT: Negative for tinnitus.   Eyes: Negative for photophobia and visual disturbance.  Respiratory: Negative for choking and stridor.   Gastrointestinal: Negative for vomiting and blood in stool.  Genitourinary: Negative for hematuria and decreased urine volume.  Musculoskeletal: Negative for gait problem.  Skin: Negative for color change and wound.  Neurological: Negative for tremors and numbness.  Psychiatric/Behavioral: Negative for decreased concentration. The patient is not hyperactive.      Objective:   Physical Exam BP 130/90  Pulse 106  Temp 97.9 F (36.6 C) (Oral)  Ht 6\' 2"  (1.88 m)  Wt 244 lb 2 oz (110.734 kg)  BMI 31.34 kg/m2  SpO2 95% Physical Exam  VS noted Constitutional: Pt appears well-developed and well-nourished.  HENT: Head: Normocephalic.  Right Ear: External ear normal.  Left Ear: External ear normal.  Eyes: Conjunctivae and EOM are normal. Pupils are equal, round, and reactive to light.  Neck: Normal range of motion. Neck supple.  Cardiovascular: Normal rate  and regular rhythm.   Pulmonary/Chest: Effort normal and breath sounds normal.  Abd:  Soft, NT, non-distended, +BS Neurological: Pt is alert. Not confused Skin: Skin is warm. No erythema. Skin lesion to scalp near crown > 1/2 cm, black, raised, nonulcerated but slightly irreg edges Psychiatric: Pt behavior is normal. Thought content normal.     Assessment & Plan:

## 2012-08-06 NOTE — Patient Instructions (Addendum)
Continue all other medications as before Your refills were done as requested today Please keep your appointments with your specialists as you have planned - [facility] Please call for dermatology evaluation of the skin lesion to the scalp Please go to LAB in the Basement for the blood and/or urine tests to be done today You will be contacted by phone if any changes need to be made immediately.  Otherwise, you will receive a letter about your results with an explanation. Please return in 1 year for your yearly visit, or sooner if needed

## 2012-08-06 NOTE — Assessment & Plan Note (Signed)
Black/dark , raised , approox 1/2 cm, nontender, and increased in size to top of head near crown - for derm f/u (wife states she will call) - somewhat suspicious for melanoma

## 2012-08-08 ENCOUNTER — Encounter: Payer: Self-pay | Admitting: Internal Medicine

## 2012-08-08 NOTE — Assessment & Plan Note (Signed)
stable overall by hx and exam, most recent data reviewed with pt, and pt to continue medical treatment as before BP Readings from Last 3 Encounters:  08/06/12 130/90  05/07/12 157/93  04/23/11 124/74

## 2012-08-08 NOTE — Assessment & Plan Note (Signed)
stable overall by hx and exam, most recent data reviewed with pt, and pt to continue medical treatment as before Lab Results  Component Value Date   HGBA1C 5.5 08/06/2012

## 2012-08-08 NOTE — Assessment & Plan Note (Signed)
stable overall by hx and exam, most recent data reviewed with pt, and pt to continue medical treatment as before 

## 2012-08-08 NOTE — Assessment & Plan Note (Signed)
Also for psa as he is due 

## 2012-08-27 ENCOUNTER — Other Ambulatory Visit: Payer: Self-pay | Admitting: Internal Medicine

## 2012-09-21 DIAGNOSIS — G894 Chronic pain syndrome: Secondary | ICD-10-CM | POA: Diagnosis not present

## 2012-09-21 DIAGNOSIS — K6289 Other specified diseases of anus and rectum: Secondary | ICD-10-CM | POA: Diagnosis not present

## 2012-11-16 DIAGNOSIS — G894 Chronic pain syndrome: Secondary | ICD-10-CM | POA: Diagnosis not present

## 2012-11-16 DIAGNOSIS — K6289 Other specified diseases of anus and rectum: Secondary | ICD-10-CM | POA: Diagnosis not present

## 2012-11-19 DIAGNOSIS — H4011X0 Primary open-angle glaucoma, stage unspecified: Secondary | ICD-10-CM | POA: Diagnosis not present

## 2012-11-19 DIAGNOSIS — H409 Unspecified glaucoma: Secondary | ICD-10-CM | POA: Diagnosis not present

## 2012-11-19 DIAGNOSIS — H2510 Age-related nuclear cataract, unspecified eye: Secondary | ICD-10-CM | POA: Diagnosis not present

## 2013-01-11 DIAGNOSIS — K6289 Other specified diseases of anus and rectum: Secondary | ICD-10-CM | POA: Diagnosis not present

## 2013-01-11 DIAGNOSIS — M47817 Spondylosis without myelopathy or radiculopathy, lumbosacral region: Secondary | ICD-10-CM | POA: Diagnosis not present

## 2013-01-11 DIAGNOSIS — G894 Chronic pain syndrome: Secondary | ICD-10-CM | POA: Diagnosis not present

## 2013-02-02 DIAGNOSIS — R911 Solitary pulmonary nodule: Secondary | ICD-10-CM | POA: Diagnosis not present

## 2013-02-02 DIAGNOSIS — Z8509 Personal history of malignant neoplasm of other digestive organs: Secondary | ICD-10-CM | POA: Diagnosis not present

## 2013-02-02 DIAGNOSIS — C787 Secondary malignant neoplasm of liver and intrahepatic bile duct: Secondary | ICD-10-CM | POA: Insufficient documentation

## 2013-02-02 DIAGNOSIS — Q619 Cystic kidney disease, unspecified: Secondary | ICD-10-CM | POA: Diagnosis not present

## 2013-02-02 DIAGNOSIS — N200 Calculus of kidney: Secondary | ICD-10-CM | POA: Diagnosis not present

## 2013-02-02 DIAGNOSIS — C189 Malignant neoplasm of colon, unspecified: Secondary | ICD-10-CM | POA: Diagnosis not present

## 2013-02-02 DIAGNOSIS — Z933 Colostomy status: Secondary | ICD-10-CM | POA: Diagnosis not present

## 2013-02-02 DIAGNOSIS — C801 Malignant (primary) neoplasm, unspecified: Secondary | ICD-10-CM | POA: Diagnosis not present

## 2013-03-11 DIAGNOSIS — G894 Chronic pain syndrome: Secondary | ICD-10-CM | POA: Diagnosis not present

## 2013-03-11 DIAGNOSIS — Z79899 Other long term (current) drug therapy: Secondary | ICD-10-CM | POA: Diagnosis not present

## 2013-03-11 DIAGNOSIS — K6289 Other specified diseases of anus and rectum: Secondary | ICD-10-CM | POA: Diagnosis not present

## 2013-03-11 DIAGNOSIS — M47817 Spondylosis without myelopathy or radiculopathy, lumbosacral region: Secondary | ICD-10-CM | POA: Diagnosis not present

## 2013-05-06 DIAGNOSIS — M47817 Spondylosis without myelopathy or radiculopathy, lumbosacral region: Secondary | ICD-10-CM | POA: Diagnosis not present

## 2013-05-06 DIAGNOSIS — G894 Chronic pain syndrome: Secondary | ICD-10-CM | POA: Diagnosis not present

## 2013-05-06 DIAGNOSIS — K6289 Other specified diseases of anus and rectum: Secondary | ICD-10-CM | POA: Diagnosis not present

## 2013-07-01 DIAGNOSIS — G894 Chronic pain syndrome: Secondary | ICD-10-CM | POA: Diagnosis not present

## 2013-07-01 DIAGNOSIS — H4011X0 Primary open-angle glaucoma, stage unspecified: Secondary | ICD-10-CM | POA: Diagnosis not present

## 2013-07-01 DIAGNOSIS — H409 Unspecified glaucoma: Secondary | ICD-10-CM | POA: Diagnosis not present

## 2013-07-01 DIAGNOSIS — K6289 Other specified diseases of anus and rectum: Secondary | ICD-10-CM | POA: Diagnosis not present

## 2013-07-29 ENCOUNTER — Other Ambulatory Visit: Payer: Self-pay | Admitting: Internal Medicine

## 2013-07-30 ENCOUNTER — Other Ambulatory Visit: Payer: Self-pay | Admitting: Internal Medicine

## 2013-08-09 ENCOUNTER — Encounter: Payer: Self-pay | Admitting: Internal Medicine

## 2013-08-09 ENCOUNTER — Other Ambulatory Visit (INDEPENDENT_AMBULATORY_CARE_PROVIDER_SITE_OTHER): Payer: Medicare Other

## 2013-08-09 ENCOUNTER — Ambulatory Visit (INDEPENDENT_AMBULATORY_CARE_PROVIDER_SITE_OTHER): Payer: Medicare Other | Admitting: Internal Medicine

## 2013-08-09 VITALS — BP 140/82 | HR 69 | Temp 98.0°F | Ht 74.0 in | Wt 234.2 lb

## 2013-08-09 DIAGNOSIS — E785 Hyperlipidemia, unspecified: Secondary | ICD-10-CM

## 2013-08-09 DIAGNOSIS — I10 Essential (primary) hypertension: Secondary | ICD-10-CM

## 2013-08-09 DIAGNOSIS — R7309 Other abnormal glucose: Secondary | ICD-10-CM | POA: Diagnosis not present

## 2013-08-09 DIAGNOSIS — N320 Bladder-neck obstruction: Secondary | ICD-10-CM

## 2013-08-09 DIAGNOSIS — R7302 Impaired glucose tolerance (oral): Secondary | ICD-10-CM

## 2013-08-09 LAB — CBC WITH DIFFERENTIAL/PLATELET
Basophils Absolute: 0 10*3/uL (ref 0.0–0.1)
Eosinophils Relative: 4.6 % (ref 0.0–5.0)
HCT: 42.3 % (ref 39.0–52.0)
Hemoglobin: 14.6 g/dL (ref 13.0–17.0)
Lymphs Abs: 2.3 10*3/uL (ref 0.7–4.0)
MCV: 87.7 fl (ref 78.0–100.0)
Monocytes Absolute: 0.6 10*3/uL (ref 0.1–1.0)
Monocytes Relative: 8.6 % (ref 3.0–12.0)
Neutro Abs: 3.7 10*3/uL (ref 1.4–7.7)
Platelets: 207 10*3/uL (ref 150.0–400.0)
RDW: 12.4 % (ref 11.5–14.6)

## 2013-08-09 LAB — BASIC METABOLIC PANEL
Calcium: 9.2 mg/dL (ref 8.4–10.5)
GFR: 79.87 mL/min (ref >60.00)
Potassium: 4.6 mEq/L (ref 3.5–5.1)
Sodium: 135 mEq/L (ref 135–145)

## 2013-08-09 LAB — LIPID PANEL
HDL: 40.8 mg/dL (ref >39.00)
LDL Cholesterol: 90 mg/dL (ref 0–99)
Total CHOL/HDL Ratio: 4
VLDL: 34.2 mg/dL (ref 0.0–40.0)

## 2013-08-09 LAB — HEPATIC FUNCTION PANEL
AST: 20 U/L (ref 0–37)
Albumin: 3.9 g/dL (ref 3.5–5.2)
Alkaline Phosphatase: 53 U/L (ref 39–117)
Bilirubin, Direct: 0.1 mg/dL (ref 0.0–0.3)
Total Bilirubin: 0.5 mg/dL (ref 0.3–1.2)

## 2013-08-09 LAB — TSH: TSH: 1.81 u[IU]/mL (ref 0.35–5.50)

## 2013-08-09 LAB — URINALYSIS, ROUTINE W REFLEX MICROSCOPIC
Bilirubin Urine: NEGATIVE
Ketones, ur: NEGATIVE
Leukocytes, UA: NEGATIVE
RBC / HPF: NONE SEEN (ref 0–?)
Specific Gravity, Urine: >=1.03 (ref 1.000–1.030)
Urine Glucose: NEGATIVE
pH: 5.5 (ref 5.0–8.0)

## 2013-08-09 LAB — PSA: PSA: 0.38 ng/mL (ref 0.10–4.00)

## 2013-08-09 MED ORDER — ALBUTEROL SULFATE HFA 108 (90 BASE) MCG/ACT IN AERS: 2.0000 | INHALATION_SPRAY | Freq: Four times a day (QID) | RESPIRATORY_TRACT | Status: DC | PRN

## 2013-08-09 MED ORDER — LOVASTATIN 40 MG PO TABS: 40.0000 mg | ORAL_TABLET | Freq: Every day | ORAL | Status: DC

## 2013-08-09 MED ORDER — AMLODIPINE-OLMESARTAN 5-40 MG PO TABS: 1.0000 | ORAL_TABLET | Freq: Every day | ORAL | Status: DC

## 2013-08-09 MED ORDER — TRIAMCINOLONE ACETONIDE 0.1 % EX CREA: TOPICAL_CREAM | Freq: Two times a day (BID) | CUTANEOUS | Status: DC

## 2013-08-09 NOTE — Assessment & Plan Note (Signed)
stable overall by history and exam, recent data reviewed with pt, and pt to continue medical treatment as before,  to f/u any worsening symptoms or concerns Lab Results  Component Value Date   LDLCALC 90 08/09/2013

## 2013-08-09 NOTE — Progress Notes (Signed)
Subjective:    Patient ID: Ryan Davila, male    DOB: January 22, 1951, 62 y.o.   MRN: [id number]  HPI  Here for yearly f/u;  Overall doing ok;  Pt denies CP, worsening SOB, DOE, wheezing, orthopnea, PND, worsening LE edema, palpitations, dizziness or syncope.  Pt denies neurological change such as new headache, facial or extremity weakness.  Pt denies polydipsia, polyuria, or low sugar symptoms. Pt states overall good compliance with treatment and medications, good tolerability, and has been trying to follow lower cholesterol diet.  Pt denies worsening depressive symptoms, suicidal ideation or panic. No fever, night sweats, wt loss, loss of appetite, or other constitutional symptoms.  Pt states good ability with ADL's, has low fall risk, home safety reviewed and adequate, no other significant changes in hearing or vision, and only occasionally active with exercise.  Still seeing pain management.  Bp at home < 140/90, plans to check more often.   Past Medical History  Diagnosis Date  . Impaired glucose tolerance 04/23/2011  . COLON CANCER, HX OF 04/18/2008  . ALLERGIC RHINITIS 04/18/2008  . DEPRESSION 04/18/2008  . GERD 04/18/2008  . HYPERLIPIDEMIA 04/18/2008  . HYPERTENSION 04/18/2008  . HEPATITIS B, HX OF 04/18/2008   Past Surgical History  Procedure Laterality Date  . Appendectomy    . Rotator cuff surgery    . Tonsillectomy    . Repair flexor tendon hand    . Rectal surgery  2003    AP resection  . Liver surgery      partial liver due to metastasis/ then CMT    reports that he has quit smoking. He does not have any smokeless tobacco history on file. He reports that  drinks alcohol. He reports that he does not use illicit drugs. family history includes Breast cancer in his maternal aunt; Colon cancer in his maternal grandfather; Diabetes in his father and sister; Prostate cancer in his maternal uncle. No Known Allergies Current Outpatient Prescriptions on File Prior to Visit  Medication Sig Dispense  Refill  . AZOR 5-40 MG per tablet TAKE 1 TABLET BY MOUTH EVERY DAY  30 tablet  0  . lovastatin (MEVACOR) 40 MG tablet TAKE 1 TABLET BY MOUTH EVERY DAY  90 tablet  3  . methadone (DOLOPHINE) 5 MG tablet 5 tablets every day      . mirtazapine (REMERON) 15 MG tablet Take 1 tablet (15 mg total) by mouth at bedtime.  90 tablet  3   No current facility-administered medications on file prior to visit.   Review of Systems  Constitutional: Negative for unexpected weight change, or unusual diaphoresis  HENT: Negative for tinnitus.   Eyes: Negative for photophobia and visual disturbance.  Respiratory: Negative for choking and stridor.   Gastrointestinal: Negative for vomiting and blood in stool.  Genitourinary: Negative for hematuria and decreased urine volume.  Musculoskeletal: Negative for acute joint swelling Skin: Negative for color change and wound.  Neurological: Negative for tremors and numbness other than noted  Psychiatric/Behavioral: Negative for decreased concentration or  hyperactivity.       Objective:   Physical Exam BP 140/82  Pulse 69  Temp(Src) 98 F (36.7 C) (Oral)  Ht 6\' 2"  (1.88 m)  Wt 234 lb 4 oz (106.255 kg)  BMI 30.06 kg/m2  SpO2 97% VS noted,  Constitutional: Pt appears well-developed and well-nourished. Lavella Lemons HENT: Head: NCAT.  Right Ear: External ear normal.  Left Ear: External ear normal.  Eyes: Conjunctivae and EOM are normal. Pupils are  equal, round, and reactive to light.  Neck: Normal range of motion. Neck supple.  Cardiovascular: Normal rate and regular rhythm.   Pulmonary/Chest: Effort normal and breath sounds normal.  Abd:  Soft, NT, non-distended, + BS Neurological: Pt is alert. Not confused  Skin: Skin is warm. No erythema.  Psychiatric: Pt behavior is normal. Thought content normal.     Assessment & Plan:

## 2013-08-09 NOTE — Assessment & Plan Note (Signed)
stable overall by history and exam, recent data reviewed with pt, and pt to continue medical treatment as before,  to f/u any worsening symptoms or concerns BP Readings from Last 3 Encounters:  08/09/13 140/82  08/06/12 130/90  05/07/12 157/93

## 2013-08-09 NOTE — Assessment & Plan Note (Signed)
Also due for psa 

## 2013-08-09 NOTE — Patient Instructions (Signed)
Please take all new medication as prescribed - the triamcinolone cream for the rash as needed Please continue all other medications as before, and refills have been done if requested. Please continue your efforts at being more active, low cholesterol diet, and weight control. You are otherwise up to date with prevention measures today. Please go to the LAB in the Basement (turn left off the elevator) for the tests to be done today You will be contacted by phone if any changes need to be made immediately.  Otherwise, you will receive a letter about your results with an explanation, but please check with MyChart first.  Please remember to sign up for My Chart if you have not done so, as this will be important to you in the future with finding out test results, communicating by private email, and scheduling acute appointments online when needed.  Please return in 1 year for your yearly visit, or sooner if needed

## 2013-08-09 NOTE — Assessment & Plan Note (Signed)
stable overall by history and exam, recent data reviewed with pt, and pt to continue medical treatment as before,  to f/u any worsening symptoms or concerns Lab Results  Component Value Date   HGBA1C 5.5 08/06/2012

## 2013-08-26 DIAGNOSIS — Z79899 Other long term (current) drug therapy: Secondary | ICD-10-CM | POA: Diagnosis not present

## 2013-08-26 DIAGNOSIS — G894 Chronic pain syndrome: Secondary | ICD-10-CM | POA: Diagnosis not present

## 2013-08-26 DIAGNOSIS — K6289 Other specified diseases of anus and rectum: Secondary | ICD-10-CM | POA: Diagnosis not present

## 2013-10-21 DIAGNOSIS — G894 Chronic pain syndrome: Secondary | ICD-10-CM | POA: Diagnosis not present

## 2013-10-21 DIAGNOSIS — Z79899 Other long term (current) drug therapy: Secondary | ICD-10-CM | POA: Diagnosis not present

## 2013-10-21 DIAGNOSIS — K6289 Other specified diseases of anus and rectum: Secondary | ICD-10-CM | POA: Diagnosis not present

## 2013-12-01 ENCOUNTER — Encounter: Payer: Self-pay | Admitting: Gastroenterology

## 2013-12-20 DIAGNOSIS — Z79899 Other long term (current) drug therapy: Secondary | ICD-10-CM | POA: Diagnosis not present

## 2013-12-20 DIAGNOSIS — K6289 Other specified diseases of anus and rectum: Secondary | ICD-10-CM | POA: Diagnosis not present

## 2013-12-20 DIAGNOSIS — G894 Chronic pain syndrome: Secondary | ICD-10-CM | POA: Diagnosis not present

## 2014-02-04 ENCOUNTER — Telehealth: Payer: Self-pay | Admitting: Internal Medicine

## 2014-02-04 DIAGNOSIS — R079 Chest pain, unspecified: Secondary | ICD-10-CM | POA: Diagnosis not present

## 2014-02-04 DIAGNOSIS — R0789 Other chest pain: Secondary | ICD-10-CM | POA: Diagnosis not present

## 2014-02-04 DIAGNOSIS — E785 Hyperlipidemia, unspecified: Secondary | ICD-10-CM | POA: Diagnosis not present

## 2014-02-04 DIAGNOSIS — Z85048 Personal history of other malignant neoplasm of rectum, rectosigmoid junction, and anus: Secondary | ICD-10-CM | POA: Diagnosis not present

## 2014-02-04 DIAGNOSIS — F17200 Nicotine dependence, unspecified, uncomplicated: Secondary | ICD-10-CM | POA: Diagnosis not present

## 2014-02-04 DIAGNOSIS — R918 Other nonspecific abnormal finding of lung field: Secondary | ICD-10-CM | POA: Diagnosis not present

## 2014-02-04 DIAGNOSIS — C787 Secondary malignant neoplasm of liver and intrahepatic bile duct: Secondary | ICD-10-CM | POA: Diagnosis not present

## 2014-02-04 DIAGNOSIS — C189 Malignant neoplasm of colon, unspecified: Secondary | ICD-10-CM | POA: Diagnosis not present

## 2014-02-04 DIAGNOSIS — Z8249 Family history of ischemic heart disease and other diseases of the circulatory system: Secondary | ICD-10-CM | POA: Diagnosis not present

## 2014-02-04 DIAGNOSIS — I10 Essential (primary) hypertension: Secondary | ICD-10-CM | POA: Diagnosis not present

## 2014-02-04 DIAGNOSIS — Z79899 Other long term (current) drug therapy: Secondary | ICD-10-CM | POA: Diagnosis not present

## 2014-02-04 DIAGNOSIS — I517 Cardiomegaly: Secondary | ICD-10-CM | POA: Diagnosis not present

## 2014-02-04 DIAGNOSIS — Z5181 Encounter for therapeutic drug level monitoring: Secondary | ICD-10-CM | POA: Diagnosis not present

## 2014-02-04 NOTE — Telephone Encounter (Signed)
Called left message to call back 

## 2014-02-04 NOTE — Telephone Encounter (Signed)
[facility] for referral - done per [facility] for robin to let pt know

## 2014-02-04 NOTE — Telephone Encounter (Signed)
Called to inform the patient of referral.  The PA at [facility] was concerned for the patient and scheduled him today 02/04/14 with a cardiologist at [facility].  Please cancel cardiologist referral for this patient.

## 2014-02-04 NOTE — Telephone Encounter (Signed)
Ryan Simmonds, PA at Dr. Karmen Stabs office at [facility] called requesting to speak with Dr. Jenny Reichmann about the patient who mentioned to Dr. Mallie Darting, patient's Oncologist, that he was having intermittent chest pain more frequently. She states that he was not currently having chest pain. She wants to discuss with Dr. Jenny Reichmann the need for him to write out a referral for Cardiology for the patient. Clarise Cruz can be reached at [phone].  I offered her to make an OV for pt but she says she feels more comfortable discussing with Dr. Jenny Reichmann prior to making the patient an OV.

## 2014-02-04 NOTE — Telephone Encounter (Signed)
Ok to forward to cancel to [facility]

## 2014-02-21 DIAGNOSIS — G894 Chronic pain syndrome: Secondary | ICD-10-CM | POA: Diagnosis not present

## 2014-02-21 DIAGNOSIS — K6289 Other specified diseases of anus and rectum: Secondary | ICD-10-CM | POA: Diagnosis not present

## 2014-02-21 DIAGNOSIS — Z79899 Other long term (current) drug therapy: Secondary | ICD-10-CM | POA: Diagnosis not present

## 2014-02-25 DIAGNOSIS — R0789 Other chest pain: Secondary | ICD-10-CM | POA: Diagnosis not present

## 2014-02-25 DIAGNOSIS — R079 Chest pain, unspecified: Secondary | ICD-10-CM | POA: Diagnosis not present

## 2014-03-01 ENCOUNTER — Institutional Professional Consult (permissible substitution): Payer: Medicare Other | Admitting: Cardiology

## 2014-04-04 DIAGNOSIS — L821 Other seborrheic keratosis: Secondary | ICD-10-CM | POA: Diagnosis not present

## 2014-04-04 DIAGNOSIS — L723 Sebaceous cyst: Secondary | ICD-10-CM | POA: Diagnosis not present

## 2014-04-04 DIAGNOSIS — L408 Other psoriasis: Secondary | ICD-10-CM | POA: Diagnosis not present

## 2014-04-11 DIAGNOSIS — H409 Unspecified glaucoma: Secondary | ICD-10-CM | POA: Diagnosis not present

## 2014-04-11 DIAGNOSIS — H2510 Age-related nuclear cataract, unspecified eye: Secondary | ICD-10-CM | POA: Diagnosis not present

## 2014-04-11 DIAGNOSIS — H4011X0 Primary open-angle glaucoma, stage unspecified: Secondary | ICD-10-CM | POA: Diagnosis not present

## 2014-04-21 DIAGNOSIS — Z79899 Other long term (current) drug therapy: Secondary | ICD-10-CM | POA: Diagnosis not present

## 2014-04-21 DIAGNOSIS — K6289 Other specified diseases of anus and rectum: Secondary | ICD-10-CM | POA: Diagnosis not present

## 2014-04-21 DIAGNOSIS — G894 Chronic pain syndrome: Secondary | ICD-10-CM | POA: Diagnosis not present

## 2014-06-16 DIAGNOSIS — K6289 Other specified diseases of anus and rectum: Secondary | ICD-10-CM | POA: Diagnosis not present

## 2014-06-16 DIAGNOSIS — G894 Chronic pain syndrome: Secondary | ICD-10-CM | POA: Diagnosis not present

## 2014-06-16 DIAGNOSIS — Z79899 Other long term (current) drug therapy: Secondary | ICD-10-CM | POA: Diagnosis not present

## 2014-06-30 ENCOUNTER — Encounter: Payer: Self-pay | Admitting: Gastroenterology

## 2014-07-04 ENCOUNTER — Telehealth: Payer: Self-pay | Admitting: Gastroenterology

## 2014-07-04 NOTE — Telephone Encounter (Signed)
Spoke with pts wife and she is aware. States they will call back in a couple of weeks to try and get an am appt scheduled for colon.

## 2014-07-04 NOTE — Telephone Encounter (Signed)
Pts wife thought that colon recall was due in 5 years. They received recall letter in the mail stating that he is past due for repeat colon. Last colon was done 01/24/2011. Please advise.

## 2014-07-04 NOTE — Telephone Encounter (Signed)
Three-year followup is correct in view of his history of colon cancer

## 2014-07-15 ENCOUNTER — Encounter: Payer: Self-pay | Admitting: Gastroenterology

## 2014-07-29 DIAGNOSIS — E785 Hyperlipidemia, unspecified: Secondary | ICD-10-CM | POA: Diagnosis not present

## 2014-07-29 DIAGNOSIS — R0789 Other chest pain: Secondary | ICD-10-CM | POA: Diagnosis not present

## 2014-07-29 DIAGNOSIS — I10 Essential (primary) hypertension: Secondary | ICD-10-CM | POA: Diagnosis not present

## 2014-08-02 ENCOUNTER — Encounter: Payer: Self-pay | Admitting: Gastroenterology

## 2014-08-10 DIAGNOSIS — Z79899 Other long term (current) drug therapy: Secondary | ICD-10-CM | POA: Diagnosis not present

## 2014-08-10 DIAGNOSIS — G894 Chronic pain syndrome: Secondary | ICD-10-CM | POA: Diagnosis not present

## 2014-08-10 DIAGNOSIS — K6289 Other specified diseases of anus and rectum: Secondary | ICD-10-CM | POA: Diagnosis not present

## 2014-08-10 DIAGNOSIS — M47817 Spondylosis without myelopathy or radiculopathy, lumbosacral region: Secondary | ICD-10-CM | POA: Diagnosis not present

## 2014-08-16 ENCOUNTER — Other Ambulatory Visit: Payer: Self-pay | Admitting: Internal Medicine

## 2014-09-09 ENCOUNTER — Ambulatory Visit (AMBULATORY_SURGERY_CENTER): Payer: Self-pay | Admitting: *Deleted

## 2014-09-09 VITALS — Ht 74.0 in | Wt 248.8 lb

## 2014-09-09 DIAGNOSIS — Z85038 Personal history of other malignant neoplasm of large intestine: Secondary | ICD-10-CM

## 2014-09-09 MED ORDER — NA SULFATE-K SULFATE-MG SULF 17.5-3.13-1.6 GM/177ML PO SOLN: 1.0000 | Freq: Once | ORAL | Status: DC

## 2014-09-09 NOTE — Progress Notes (Signed)
No home 02 use. ewm No egg or soy allergy. ewm No diet pills. ewm No issues with past sedation. ewm Pt has colostomy. ewm

## 2014-09-23 ENCOUNTER — Encounter: Payer: Self-pay | Admitting: Gastroenterology

## 2014-09-23 ENCOUNTER — Ambulatory Visit (AMBULATORY_SURGERY_CENTER): Payer: Medicare Other | Admitting: Gastroenterology

## 2014-09-23 VITALS — BP 156/91 | HR 73 | Temp 97.5°F | Resp 19 | Ht 74.0 in | Wt 248.8 lb

## 2014-09-23 DIAGNOSIS — D124 Benign neoplasm of descending colon: Secondary | ICD-10-CM | POA: Diagnosis not present

## 2014-09-23 DIAGNOSIS — B1910 Unspecified viral hepatitis B without hepatic coma: Secondary | ICD-10-CM | POA: Diagnosis not present

## 2014-09-23 DIAGNOSIS — C229 Malignant neoplasm of liver, not specified as primary or secondary: Secondary | ICD-10-CM | POA: Diagnosis not present

## 2014-09-23 DIAGNOSIS — Z85048 Personal history of other malignant neoplasm of rectum, rectosigmoid junction, and anus: Secondary | ICD-10-CM | POA: Diagnosis not present

## 2014-09-23 DIAGNOSIS — E669 Obesity, unspecified: Secondary | ICD-10-CM | POA: Diagnosis not present

## 2014-09-23 DIAGNOSIS — D123 Benign neoplasm of transverse colon: Secondary | ICD-10-CM

## 2014-09-23 DIAGNOSIS — I10 Essential (primary) hypertension: Secondary | ICD-10-CM | POA: Diagnosis not present

## 2014-09-23 DIAGNOSIS — Z86010 Personal history of colonic polyps: Secondary | ICD-10-CM

## 2014-09-23 DIAGNOSIS — G8929 Other chronic pain: Secondary | ICD-10-CM | POA: Diagnosis not present

## 2014-09-23 DIAGNOSIS — Z85038 Personal history of other malignant neoplasm of large intestine: Secondary | ICD-10-CM | POA: Diagnosis not present

## 2014-09-23 MED ORDER — SODIUM CHLORIDE 0.9 % IV SOLN: 500.0000 mL | INTRAVENOUS | Status: DC

## 2014-09-23 NOTE — Op Note (Signed)
[place]  [address] [place], [id number]   COLONOSCOPY PROCEDURE REPORT  PATIENT: Ryan Davila, Ryan Davila  MR#: [id number] BIRTHDATE: 18-Jul-1951 , 62  yrs. old GENDER: male ENDOSCOPIST: Inda Castle, MD REFERRED BY: PROCEDURE DATE:  09/23/2014 PROCEDURE:   Colonoscopy (via ostomy) with snare polypectomy and Colonoscopy with cold biopsy polypectomy First Screening Colonoscopy - Avg.  risk and is 50 yrs.  old or older - No.  Prior Negative Screening - Now for repeat screening. N/A  History of Adenoma - Now for follow-up colonoscopy & has been > or = to 3 yrs.  No.  It has been less than 3 yrs since last colonoscopy.  Other: See Comments  Polyps Removed Today? Yes. ASA CLASS:   Class II INDICATIONS:high risk personal history of rectal cancer. MEDICATIONS: Monitored anesthesia care and Propofol 170 mg IV  DESCRIPTION OF PROCEDURE:   After the risks benefits and alternatives of the procedure were thoroughly explained, informed consent was obtained.  The digital  exam was normal.        The [id number]  endoscope was introduced through the anus and advanced to the cecum, which was identified by both the appendix and ileocecal valve. No adverse events experienced.   The quality of the prep was Suprep good  The instrument was then slowly withdrawn as the colon was fully examined.      COLON FINDINGS: A sessile polyp measuring 2 mm in size was found in the transverse colon.  A polypectomy was performed with cold forceps.  The resection was complete, the polyp tissue was completely retrieved and sent to histology.   A sessile polyp measuring 4 mm in size was found in the descending colon.  A polypectomy was performed with a cold snare.  The resection was complete, the polyp tissue was completely retrieved and sent to histology.   The examination was otherwise normal.   The examination was otherwise normal.  retroflexion not performed The time to cecum=1  minutes 15 seconds.  Withdrawal time=6 minutes 02 seconds.  The scope was withdrawn and the procedure completed. COMPLICATIONS: There were no immediate complications.  ENDOSCOPIC IMPRESSION: 1.   Sessile polyp measuring 2 mm in size was found in the transverse colon; polypectomy was performed with cold forceps 2.   Sessile polyp measuring 4 mm in size was found in the descending colon; polypectomy was performed with a cold snare 3.   The examination was otherwise normal   RECOMMENDATIONS: Colonoscopy 5 years  eSigned:  Inda Castle, MD 09/23/2014 9:04 AM   cc: Biagio Borg, MD   PATIENT NAME:  Ryan Davila, Ryan Davila MR#: [id number]

## 2014-09-23 NOTE — Progress Notes (Signed)
Called to room to assist during endoscopic procedure.  Patient ID and intended procedure confirmed with present staff. Received instructions for my participation in the procedure from the performing physician.  

## 2014-09-23 NOTE — Patient Instructions (Signed)
YOU HAD AN ENDOSCOPIC PROCEDURE TODAY AT THE [facility]: Refer to the procedure report that was given to you for any specific questions about what was found during the examination.  If the procedure report does not answer your questions, please call your gastroenterologist to clarify.  If you requested that your care partner not be given the details of your procedure findings, then the procedure report has been included in a sealed envelope for you to review at your convenience later.  YOU SHOULD EXPECT: Some feelings of bloating in the abdomen. Passage of more gas than usual.  Walking can help get rid of the air that was put into your GI tract during the procedure and reduce the bloating. If you had a lower endoscopy (such as a colonoscopy or flexible sigmoidoscopy) you may notice spotting of blood in your stool or on the toilet paper. If you underwent a bowel prep for your procedure, then you may not have a normal bowel movement for a few days.  DIET: Your first meal following the procedure should be a light meal and then it is ok to progress to your normal diet.  A half-sandwich or bowl of soup is an example of a good first meal.  Heavy or fried foods are harder to digest and may make you feel nauseous or bloated.  Likewise meals heavy in dairy and vegetables can cause extra gas to form and this can also increase the bloating.  Drink plenty of fluids but you should avoid alcoholic beverages for 24 hours.  ACTIVITY: Your care partner should take you home directly after the procedure.  You should plan to take it easy, moving slowly for the rest of the day.  You can resume normal activity the day after the procedure however you should NOT DRIVE or use heavy machinery for 24 hours (because of the sedation medicines used during the test).    SYMPTOMS TO REPORT IMMEDIATELY: A gastroenterologist can be reached at any hour.  During normal business hours, 8:30 AM to 5:00 PM Monday through Friday,  call [phone].  After hours and on weekends, please call the GI answering service at [phone] who will take a message and have the physician on call contact you.   Following lower endoscopy (colonoscopy or flexible sigmoidoscopy):  Excessive amounts of blood in the stool  Significant tenderness or worsening of abdominal pains  Swelling of the abdomen that is new, acute  Fever of 100F or higher    FOLLOW UP: If any biopsies were taken you will be contacted by phone or by letter within the next 1-3 weeks.  Call your gastroenterologist if you have not heard about the biopsies in 3 weeks.  Our staff will call the home number listed on your records the next business day following your procedure to check on you and address any questions or concerns that you may have at that time regarding the information given to you following your procedure. This is a courtesy call and so if there is no answer at the home number and we have not heard from you through the emergency physician on call, we will assume that you have returned to your regular daily activities without incident.  SIGNATURES/CONFIDENTIALITY: You and/or your care partner have signed paperwork which will be entered into your electronic medical record.  These signatures attest to the fact that that the information above on your After Visit Summary has been reviewed and is understood.  Full responsibility of the confidentiality   of this discharge information lies with you and/or your care-partner.     

## 2014-09-23 NOTE — Progress Notes (Signed)
Procedure ends, to recovery, report given and VSS. 

## 2014-09-26 ENCOUNTER — Telehealth: Payer: Self-pay

## 2014-09-26 NOTE — Telephone Encounter (Signed)
  Follow up Call-  Call back number 09/23/2014  Post procedure Call Back phone  # [phone] speak with wife, Gwinda Passe  Permission to leave phone message Yes     Patient questions:  Do you have a fever, pain , or abdominal swelling? No. Pain Score  0 *  Have you tolerated food without any problems? Yes.    Have you been able to return to your normal activities? Yes.    Do you have any questions about your discharge instructions: Diet   No. Medications  No. Follow up visit  No.  Do you have questions or concerns about your Care? No.  Actions: * If pain score is 4 or above: No action needed, pain <4. Wife stated he was complaining of soreness to abdominal wall. Also seen a small of blood. Instructed to call back if symptoms persists. No temperature.

## 2014-10-06 ENCOUNTER — Telehealth: Payer: Self-pay | Admitting: Gastroenterology

## 2014-10-06 DIAGNOSIS — G894 Chronic pain syndrome: Secondary | ICD-10-CM | POA: Diagnosis not present

## 2014-10-06 DIAGNOSIS — K629 Disease of anus and rectum, unspecified: Secondary | ICD-10-CM | POA: Diagnosis not present

## 2014-10-06 DIAGNOSIS — Z79891 Long term (current) use of opiate analgesic: Secondary | ICD-10-CM | POA: Diagnosis not present

## 2014-10-06 NOTE — Telephone Encounter (Signed)
Colo 5 years

## 2014-10-06 NOTE — Telephone Encounter (Signed)
Report is in [place]. Let me know what you want on the recall and I will tell the patient.

## 2014-10-07 NOTE — Telephone Encounter (Signed)
Advised Mrs. Courville of the recall.

## 2014-10-09 ENCOUNTER — Encounter: Payer: Self-pay | Admitting: Gastroenterology

## 2014-10-13 DIAGNOSIS — H4011X1 Primary open-angle glaucoma, mild stage: Secondary | ICD-10-CM | POA: Diagnosis not present

## 2014-10-20 ENCOUNTER — Ambulatory Visit: Payer: Medicare Other | Admitting: Internal Medicine

## 2014-10-21 ENCOUNTER — Encounter: Payer: Self-pay | Admitting: Internal Medicine

## 2014-10-21 ENCOUNTER — Ambulatory Visit (INDEPENDENT_AMBULATORY_CARE_PROVIDER_SITE_OTHER): Payer: Medicare Other | Admitting: Internal Medicine

## 2014-10-21 VITALS — BP 162/90 | HR 86 | Temp 97.8°F | Ht 74.0 in | Wt 245.2 lb

## 2014-10-21 DIAGNOSIS — I10 Essential (primary) hypertension: Secondary | ICD-10-CM | POA: Diagnosis not present

## 2014-10-21 DIAGNOSIS — R7302 Impaired glucose tolerance (oral): Secondary | ICD-10-CM

## 2014-10-21 DIAGNOSIS — F32A Depression, unspecified: Secondary | ICD-10-CM

## 2014-10-21 DIAGNOSIS — F329 Major depressive disorder, single episode, unspecified: Secondary | ICD-10-CM

## 2014-10-21 MED ORDER — CARVEDILOL 12.5 MG PO TABS: 12.5000 mg | ORAL_TABLET | Freq: Two times a day (BID) | ORAL | Status: DC

## 2014-10-21 NOTE — Progress Notes (Signed)
Pre visit review using our clinic review tool, if applicable. No additional management support is needed unless otherwise documented below in the visit note. 

## 2014-10-21 NOTE — Progress Notes (Signed)
Subjective:    Patient ID: Ryan Davila, male    DOB: 01-Feb-1951, 63 y.o.   MRN: [id number]  HPI   Here with wife, has been seen recently per Williams with extensive evaluation, including echo and stress test.  Was begun on low dose coreg 3.125 with instructions to increase to 6.25 mg bid, but elects not to f/u there due to cost and distance.  Now out recently of coreg as was not given long term refills.  Did tolerate well. Pt denies chest pain, increased sob or doe, wheezing, orthopnea, PND, increased LE swelling, palpitations, dizziness or syncope.  BP at home similar to today, has been recently more difficult to control.   Pt denies polydipsia, polyuria, Denies worsening depressive symptoms, suicidal ideation, or panic  Past Medical History  Diagnosis Date  . Impaired glucose tolerance 04/23/2011  . COLON CANCER, HX OF 04/18/2008  . ALLERGIC RHINITIS 04/18/2008  . DEPRESSION 04/18/2008  . GERD 04/18/2008  . HYPERLIPIDEMIA 04/18/2008  . HYPERTENSION 04/18/2008  . HEPATITIS B, HX OF 04/18/2008  . Allergy   . Cancer     colon cancer  . Blood transfusion without reported diagnosis     as infant   Past Surgical History  Procedure Laterality Date  . Appendectomy    . Rotator cuff surgery    . Tonsillectomy    . Repair flexor tendon hand    . Rectal surgery  2003    AP resection  . 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 Colonoscopy    . Colostomy    . Polypectomy      reports that he has quit smoking. He has quit using smokeless tobacco. He reports that he drinks alcohol. He reports that he does not use illicit drugs. family history includes Breast cancer in his maternal aunt; Colon cancer in his maternal grandfather; Diabetes in his father and sister; Prostate cancer in his maternal uncle. No Known Allergies Current Outpatient Prescriptions on File Prior to Visit  Medication Sig Dispense Refill  . albuterol (PROAIR HFA) 108 (90 BASE) MCG/ACT inhaler Inhale 2  puffs into the lungs every 6 (six) hours as needed for wheezing. 1 Inhaler 11  . amLODipine-olmesartan (AZOR) 5-40 MG per tablet Take 1 tablet by mouth daily. 90 tablet 3  . aspirin EC 81 MG tablet Take by mouth daily.     Marland Kitchen latanoprost (XALATAN) 0.005 % ophthalmic solution Place 1 drop into both eyes at bedtime. 0.005 %    . lovastatin (MEVACOR) 40 MG tablet TAKE 1 TABLET BY MOUTH EVERY DAY 90 tablet 3  . methadone (DOLOPHINE) 5 MG tablet Take 5 mg by mouth daily. 5mg  taken 5 times a day. Total 25mg  per day.    . mirtazapine (REMERON) 15 MG tablet Take 1 tablet (15 mg total) by mouth at bedtime. 90 tablet 3  . nitroGLYCERIN (NITROSTAT) 0.4 MG SL tablet Place under the tongue.    . triamcinolone cream (KENALOG) 0.1 % Apply topically 2 (two) times daily. 30 g 0  . HYDROcodone-acetaminophen (VICODIN) 5-500 MG per tablet Take 1 tablet by mouth every 8 (eight) hours as needed.      No current facility-administered medications on file prior to visit.   Review of Systems  Constitutional: Negative for unusual diaphoresis or other sweats  HENT: Negative for ringing in ear Eyes: Negative for double vision or worsening visual disturbance.  Respiratory: Negative for choking and stridor.  Gastrointestinal: Negative for vomiting or other signifcant bowel change Genitourinary: Negative for hematuria or decreased urine volume.  Musculoskeletal: Negative for other MSK pain or swelling Skin: Negative for color change and worsening wound.  Neurological: Negative for tremors and numbness other than noted  Psychiatric/Behavioral: Negative for decreased concentration or agitation other than above  '    Objective:   Physical Exam BP 162/90 mmHg  Pulse 86  Temp(Src) 97.8 F (36.6 C) (Oral)  Ht 6\' 2"  (1.88 m)  Wt 245 lb 4 oz (111.245 kg)  BMI 31.47 kg/m2  SpO2 96% VS noted,  Constitutional: Pt appears well-developed, well-nourished.  HENT: Head: NCAT.  Right Ear: External ear normal.  Left Ear:  External ear normal.  Eyes: . Pupils are equal, round, and reactive to light. Conjunctivae and EOM are normal Neck: Normal range of motion. Neck supple.  Cardiovascular: Normal rate and regular rhythm.   Pulmonary/Chest: Effort normal and breath sounds normal.  Neurological: Pt is alert. Not confused , motor grossly intact Skin: Skin is warm. No rash Psychiatric: Pt behavior is normal. No agitation. not depressed affect    Assessment & Plan:

## 2014-10-21 NOTE — Patient Instructions (Signed)
Ok to re-start the coreg at HALF of the 12.5 mg twice per day for 2 wks,  Then increase to [facility] pill twice per day after that  Please continue all other medications as before, and refills have been done if requested.  Please have the pharmacy call with any other refills you may need.  Please keep your appointments with your specialists as you may have planned  Please return in 1 months, or sooner if needed

## 2014-10-22 ENCOUNTER — Other Ambulatory Visit: Payer: Self-pay | Admitting: Internal Medicine

## 2014-10-22 NOTE — Assessment & Plan Note (Signed)
Uncontrolled, to re-start coreg at 12.5  - half bid for 2 wks, then 1 bid, with f/u at 4 wks; to check BP/HR bid, with call for any lows, o/w stable overall by history and exam, recent data reviewed with pt, and pt to continue medical treatment as before,  to f/u any worsening symptoms or concerns BP Readings from Last 3 Encounters:  10/21/14 162/90  09/23/14 156/91  08/09/13 140/82

## 2014-10-22 NOTE — Assessment & Plan Note (Signed)
stable overall by history and exam, recent data reviewed with pt, and pt to continue medical treatment as before,  to f/u any worsening symptoms or concerns Lab Results  Component Value Date   WBC 7.0 08/09/2013   HGB 14.6 08/09/2013   HCT 42.3 08/09/2013   PLT 207.0 08/09/2013   GLUCOSE 89 08/09/2013   CHOL 165 08/09/2013   TRIG 171.0* 08/09/2013   HDL 40.80 08/09/2013   LDLDIRECT 86.3 08/06/2012   LDLCALC 90 08/09/2013   ALT 20 08/09/2013   AST 20 08/09/2013   NA 135 08/09/2013   K 4.6 08/09/2013   CL 102 08/09/2013   CREATININE 1.0 08/09/2013   BUN 12 08/09/2013   CO2 29 08/09/2013   TSH 1.81 08/09/2013   PSA 0.38 08/09/2013   INR 0.84 02/22/2010   HGBA1C 5.5 08/06/2012

## 2014-10-22 NOTE — Assessment & Plan Note (Signed)
stable overall by history and exam, recent data reviewed with pt, and pt to continue medical treatment as before,  to f/u any worsening symptoms or concerns Lab Results  Component Value Date   HGBA1C 5.5 08/06/2012

## 2014-11-25 ENCOUNTER — Encounter: Payer: Self-pay | Admitting: Internal Medicine

## 2014-11-25 ENCOUNTER — Ambulatory Visit (INDEPENDENT_AMBULATORY_CARE_PROVIDER_SITE_OTHER): Payer: Medicare Other | Admitting: Internal Medicine

## 2014-11-25 ENCOUNTER — Other Ambulatory Visit (INDEPENDENT_AMBULATORY_CARE_PROVIDER_SITE_OTHER): Payer: Medicare Other

## 2014-11-25 VITALS — BP 130/82 | HR 72 | Temp 97.9°F | Ht 74.0 in | Wt 244.0 lb

## 2014-11-25 DIAGNOSIS — J45909 Unspecified asthma, uncomplicated: Secondary | ICD-10-CM | POA: Insufficient documentation

## 2014-11-25 DIAGNOSIS — R7302 Impaired glucose tolerance (oral): Secondary | ICD-10-CM

## 2014-11-25 DIAGNOSIS — N320 Bladder-neck obstruction: Secondary | ICD-10-CM

## 2014-11-25 DIAGNOSIS — N400 Enlarged prostate without lower urinary tract symptoms: Secondary | ICD-10-CM | POA: Diagnosis not present

## 2014-11-25 DIAGNOSIS — M25561 Pain in right knee: Secondary | ICD-10-CM

## 2014-11-25 DIAGNOSIS — E785 Hyperlipidemia, unspecified: Secondary | ICD-10-CM

## 2014-11-25 DIAGNOSIS — M25562 Pain in left knee: Secondary | ICD-10-CM

## 2014-11-25 DIAGNOSIS — I10 Essential (primary) hypertension: Secondary | ICD-10-CM | POA: Diagnosis not present

## 2014-11-25 LAB — HEPATIC FUNCTION PANEL
ALBUMIN: 4.1 g/dL (ref 3.5–5.2)
ALT: 26 U/L (ref 0–53)
AST: 26 U/L (ref 0–37)
Alkaline Phosphatase: 62 U/L (ref 39–117)
Bilirubin, Direct: 0.1 mg/dL (ref 0.0–0.3)
TOTAL PROTEIN: 6.9 g/dL (ref 6.0–8.3)
Total Bilirubin: 0.7 mg/dL (ref 0.2–1.2)

## 2014-11-25 LAB — CBC WITH DIFFERENTIAL/PLATELET
Basophils Absolute: 0.1 10*3/uL (ref 0.0–0.1)
Basophils Relative: 0.6 % (ref 0.0–3.0)
EOS PCT: 2 % (ref 0.0–5.0)
Eosinophils Absolute: 0.2 10*3/uL (ref 0.0–0.7)
HEMATOCRIT: 44 % (ref 39.0–52.0)
Hemoglobin: 15 g/dL (ref 13.0–17.0)
LYMPHS ABS: 2.5 10*3/uL (ref 0.7–4.0)
Lymphocytes Relative: 31 % (ref 12.0–46.0)
MCHC: 34 g/dL (ref 30.0–36.0)
MCV: 90.4 fl (ref 78.0–100.0)
Monocytes Absolute: 0.7 10*3/uL (ref 0.1–1.0)
Monocytes Relative: 8.3 % (ref 3.0–12.0)
Neutro Abs: 4.7 10*3/uL (ref 1.4–7.7)
Neutrophils Relative %: 58.1 % (ref 43.0–77.0)
Platelets: 274 10*3/uL (ref 150.0–400.0)
RBC: 4.86 Mil/uL (ref 4.22–5.81)
RDW: 12.3 % (ref 11.5–15.5)
WBC: 8.1 10*3/uL (ref 4.0–10.5)

## 2014-11-25 LAB — URINALYSIS, ROUTINE W REFLEX MICROSCOPIC
Bilirubin Urine: NEGATIVE
Hgb urine dipstick: NEGATIVE
Ketones, ur: NEGATIVE
LEUKOCYTES UA: NEGATIVE
Nitrite: NEGATIVE
PH: 5.5 (ref 5.0–8.0)
RBC / HPF: NONE SEEN (ref 0–?)
Specific Gravity, Urine: >=1.03 — AB (ref 1.000–1.030)
Total Protein, Urine: NEGATIVE
Urine Glucose: NEGATIVE
Urobilinogen, UA: 0.2 (ref 0.0–1.0)
WBC, UA: NONE SEEN (ref 0–?)

## 2014-11-25 LAB — LIPID PANEL
CHOLESTEROL: 170 mg/dL (ref 0–200)
HDL: 34.4 mg/dL — AB (ref >39.00)
NonHDL: 135.6
Total CHOL/HDL Ratio: 5
Triglycerides: 345 mg/dL — ABNORMAL HIGH (ref 0.0–149.0)
VLDL: 69 mg/dL — ABNORMAL HIGH (ref 0.0–40.0)

## 2014-11-25 LAB — BASIC METABOLIC PANEL
BUN: 18 mg/dL (ref 6–23)
CALCIUM: 9.1 mg/dL (ref 8.4–10.5)
CO2: 23 meq/L (ref 19–32)
Chloride: 105 mEq/L (ref 96–112)
Creatinine, Ser: 1.1 mg/dL (ref 0.4–1.5)
GFR: 75.21 mL/min (ref >60.00)
GLUCOSE: 105 mg/dL — AB (ref 70–99)
POTASSIUM: 4.5 meq/L (ref 3.5–5.1)
Sodium: 135 mEq/L (ref 135–145)

## 2014-11-25 LAB — HEMOGLOBIN A1C: HEMOGLOBIN A1C: 5.8 % (ref 4.6–6.5)

## 2014-11-25 LAB — PSA: PSA: 0.34 ng/mL (ref 0.10–4.00)

## 2014-11-25 LAB — TSH: TSH: 2.01 u[IU]/mL (ref 0.35–4.50)

## 2014-11-25 LAB — LDL CHOLESTEROL, DIRECT: Direct LDL: 95.6 mg/dL

## 2014-11-25 MED ORDER — TAMSULOSIN HCL 0.4 MG PO CAPS: 0.4000 mg | ORAL_CAPSULE | Freq: Every day | ORAL | Status: DC

## 2014-11-25 MED ORDER — ALBUTEROL SULFATE HFA 108 (90 BASE) MCG/ACT IN AERS: 2.0000 | INHALATION_SPRAY | Freq: Four times a day (QID) | RESPIRATORY_TRACT | Status: DC | PRN

## 2014-11-25 MED ORDER — LOVASTATIN 40 MG PO TABS: 40.0000 mg | ORAL_TABLET | Freq: Every day | ORAL | Status: DC

## 2014-11-25 NOTE — Assessment & Plan Note (Signed)
.  Mild to mod, for flomax trial,  to f/u any worsening symptoms or concerns

## 2014-11-25 NOTE — Patient Instructions (Signed)
Please take all new medication as prescribed - the flomax  Please continue all other medications as before, and refills have been done if requested - the inhaler and statin  Please have the pharmacy call with any other refills you may need.  Please continue your efforts at being more active, low cholesterol diet, and weight control.  You are otherwise up to date with prevention measures today.  You will be contacted regarding the referral for: Dr Tamala Julian - sports medicine (in this office)  Please keep your appointments with your specialists as you may have planned  Please go to the LAB in the Basement (turn left off the elevator) for the tests to be done today  You will be contacted by phone if any changes need to be made immediately.  Otherwise, you will receive a letter about your results with an explanation, but please check with MyChart first.  Please remember to sign up for MyChart if you have not done so, as this will be important to you in the future with finding out test results, communicating by private email, and scheduling acute appointments online when needed.  Please return in 6 months, or sooner if needed

## 2014-11-25 NOTE — Assessment & Plan Note (Signed)
C/w prob bilat knee djd - for Dr Tamala Julian referral,  to f/u any worsening symptoms or concerns

## 2014-11-25 NOTE — Progress Notes (Signed)
Subjective:    Patient ID: RC AMISON, male    DOB: 02/11/1951, 63 y.o.   MRN: [id number]  HPI  Here to f/u; overall doing ok,  Pt denies chest pain, increased sob or doe, wheezing, orthopnea, PND, increased LE swelling, palpitations, dizziness or syncope.  Pt denies polydipsia, polyuria, or low sugar symptoms such as weakness or confusion improved with po intake.  Pt denies new neurological symptoms such as new headache, or facial or extremity weakness or numbness.   Pt states overall good compliance with meds, has been trying to follow lower cholesterol diet, with wt overall stable,  but little exercise however. Does have urinary hesitancy, slower stream in the last few yrs and nocturia 3-5 times at night.   Past Medical History  Diagnosis Date  . Impaired glucose tolerance 04/23/2011  . COLON CANCER, HX OF 04/18/2008  . ALLERGIC RHINITIS 04/18/2008  . DEPRESSION 04/18/2008  . GERD 04/18/2008  . HYPERLIPIDEMIA 04/18/2008  . HYPERTENSION 04/18/2008  . HEPATITIS B, HX OF 04/18/2008  . Allergy   . Cancer     colon cancer  . Blood transfusion without reported diagnosis     as infant   Past Surgical History  Procedure Laterality Date  . Appendectomy    . Rotator cuff surgery    . Tonsillectomy    . Repair flexor tendon hand    . Rectal surgery  2003    AP resection  . 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 Colonoscopy    . Colostomy    . Polypectomy      reports that he has quit smoking. He has quit using smokeless tobacco. He reports that he drinks alcohol. He reports that he does not use illicit drugs. family history includes Breast cancer in his maternal aunt; Colon cancer in his maternal grandfather; Diabetes in his father and sister; Prostate cancer in his maternal uncle. No Known Allergies Current Outpatient Prescriptions on File Prior to Visit  Medication Sig Dispense Refill  . aspirin EC 81 MG tablet Take by mouth daily.     . AZOR 5-40 MG per tablet  TAKE 1 TABLET BY MOUTH EVERY DAY 90 tablet 3  . carvedilol (COREG) 12.5 MG tablet Take 1 tablet (12.5 mg total) by mouth 2 (two) times daily with a meal. 60 tablet 11  . HYDROcodone-acetaminophen (VICODIN) 5-500 MG per tablet Take 1 tablet by mouth every 8 (eight) hours as needed.     . latanoprost (XALATAN) 0.005 % ophthalmic solution Place 1 drop into both eyes at bedtime. 0.005 %    . methadone (DOLOPHINE) 5 MG tablet Take 5 mg by mouth daily. 5mg  taken 5 times a day. Total 25mg  per day.    . mirtazapine (REMERON) 15 MG tablet Take 1 tablet (15 mg total) by mouth at bedtime. 90 tablet 3  . nitroGLYCERIN (NITROSTAT) 0.4 MG SL tablet Place under the tongue.    . triamcinolone cream (KENALOG) 0.1 % Apply topically 2 (two) times daily. 30 g 0   No current facility-administered medications on file prior to visit.    Review of Systems Constitutional: Negative for increased diaphoresis, other activity, appetite or other siginficant weight change  HENT: Negative for worsening hearing loss, ear pain, facial swelling, mouth sores and neck stiffness.   Eyes: Negative for other worsening pain, redness or visual disturbance.  Respiratory: Negative for shortness of breath and wheezing.   Cardiovascular: Negative for chest pain  and palpitations.  Gastrointestinal: Negative for diarrhea, blood in stool, abdominal distention or other pain Genitourinary: Negative for hematuria, flank pain or change in urine volume.  Musculoskeletal: Negative for myalgias or other joint complaints.  Skin: Negative for color change and wound.  Neurological: Negative for syncope and numbness. other than noted Hematological: Negative for adenopathy. or other swelling Psychiatric/Behavioral: Negative for hallucinations, self-injury, decreased concentration or other worsening agitation.      Objective:   Physical Exam BP 130/82 mmHg  Pulse 72  Temp(Src) 97.9 F (36.6 C) (Oral)  Ht 6\' 2"  (1.88 m)  Wt 244 lb (110.678 kg)   BMI 31.31 kg/m2  SpO2 97% VS noted,  Constitutional: Pt is oriented to person, place, and time. Appears well-developed and well-nourished.  Head: Normocephalic and atraumatic.  Right Ear: External ear normal.  Left Ear: External ear normal.  Nose: Nose normal.  Mouth/Throat: Oropharynx is clear and moist.  Eyes: Conjunctivae and EOM are normal. Pupils are equal, round, and reactive to light.  Neck: Normal range of motion. Neck supple. No JVD present. No tracheal deviation present.  Cardiovascular: Normal rate, regular rhythm, normal heart sounds and intact distal pulses.   Pulmonary/Chest: Effort normal and breath sounds without rales or wheezing  Abdominal: Soft. Bowel sounds are normal. NT. No HSM  Musculoskeletal: Normal range of motion. Exhibits no edema.  Lymphadenopathy:  Has no cervical adenopathy.  Neurological: Pt is alert and oriented to person, place, and time. Pt has normal reflexes. No cranial nerve deficit. Motor grossly intact Skin: Skin is warm and dry. No rash noted.  Psychiatric:  Has normal mood and affect. Behavior is normal.     Assessment & Plan:

## 2014-11-27 NOTE — Assessment & Plan Note (Signed)
Also for psa as he is due 

## 2014-11-27 NOTE — Assessment & Plan Note (Signed)
stable overall by history and exam, recent data reviewed with pt, and pt to continue medical treatment as before,  to f/u any worsening symptoms or concerns Lab Results  Component Value Date   HGBA1C 5.8 11/25/2014

## 2014-11-27 NOTE — Assessment & Plan Note (Signed)
stable overall by history and exam, recent data reviewed with pt, and pt to continue medical treatment as before,  to f/u any worsening symptoms or concerns BP Readings from Last 3 Encounters:  11/25/14 130/82  10/21/14 162/90  09/23/14 156/91

## 2014-11-27 NOTE — Assessment & Plan Note (Signed)
stable overall by history and exam, recent data reviewed with pt, and pt to continue medical treatment as before,  to f/u any worsening symptoms or concerns Lab Results  Component Value Date   LDLCALC 90 08/09/2013   

## 2014-12-01 DIAGNOSIS — G894 Chronic pain syndrome: Secondary | ICD-10-CM | POA: Diagnosis not present

## 2014-12-01 DIAGNOSIS — K629 Disease of anus and rectum, unspecified: Secondary | ICD-10-CM | POA: Diagnosis not present

## 2014-12-01 DIAGNOSIS — Z79891 Long term (current) use of opiate analgesic: Secondary | ICD-10-CM | POA: Diagnosis not present

## 2014-12-02 ENCOUNTER — Ambulatory Visit (INDEPENDENT_AMBULATORY_CARE_PROVIDER_SITE_OTHER): Payer: Medicare Other | Admitting: Family Medicine

## 2014-12-02 ENCOUNTER — Ambulatory Visit (INDEPENDENT_AMBULATORY_CARE_PROVIDER_SITE_OTHER)
Admission: RE | Admit: 2014-12-02 | Discharge: 2014-12-02 | Disposition: A | Discharge status: Home / Self Care | Payer: Medicare Other | Source: Ambulatory Visit | Attending: Family Medicine | Admitting: Family Medicine

## 2014-12-02 ENCOUNTER — Encounter: Payer: Self-pay | Admitting: Family Medicine

## 2014-12-02 VITALS — BP 142/80 | HR 68 | Ht 74.0 in | Wt 244.0 lb

## 2014-12-02 DIAGNOSIS — M170 Bilateral primary osteoarthritis of knee: Secondary | ICD-10-CM | POA: Diagnosis not present

## 2014-12-02 DIAGNOSIS — M25562 Pain in left knee: Secondary | ICD-10-CM

## 2014-12-02 DIAGNOSIS — M13169 Monoarthritis, not elsewhere classified, unspecified knee: Secondary | ICD-10-CM | POA: Diagnosis not present

## 2014-12-02 DIAGNOSIS — M25561 Pain in right knee: Secondary | ICD-10-CM

## 2014-12-02 DIAGNOSIS — M1710 Unilateral primary osteoarthritis, unspecified knee: Secondary | ICD-10-CM | POA: Insufficient documentation

## 2014-12-02 NOTE — Progress Notes (Signed)
[facility] [place], [place] [address] Phone: [phone] Subjective:    I'm seeing this patient by the request  of:  Cathlean Cower, MD   CC: Bilateral knee pain   [id number] Ryan Davila is a 63 y.o. male coming in with complaint of bilateral knee pain. Had this pain for multiple years. Seems to be worsening. Significant worse when going from a seated to standing position as well as going up or downstairs. States that most the pain is on the anterior as well as medial aspects of the knee. Patient states that it is difficult to even kneel on his knee which he needs to do from time to time. Patient does not remember any true injury at this time but did do a significant amount of weight lifting when he was younger. Does not remember any specific injuries. Patient rates the severity of pain is 8 out of 10 because it is stopping him from certain activities and can wake him up at night. Patient has not tried any home modalities at this time. History of chronic pain secondary to back problems.    Past medical history, social, surgical and family history all reviewed in electronic medical record.   Review of Systems: No headache, visual changes, nausea, vomiting, diarrhea, constipation, dizziness, abdominal pain, skin rash, fevers, chills, night sweats, weight loss, swollen lymph nodes, body aches, joint swelling, muscle aches, chest pain, shortness of breath, mood changes.   Objective Blood pressure 142/80, pulse 68, height 6\' 2"  (1.88 m), weight 244 lb (110.678 kg).  General: No apparent distress alert and oriented x3 mood and affect normal, dressed appropriately.  HEENT: Pupils equal, extraocular movements intact  Respiratory: Patient's speak in full sentences and does not appear short of breath  Cardiovascular: No lower extremity edema, non tender, no erythema  Skin: Warm dry intact with no signs of infection or rash on extremities or on axial  skeleton.  Abdomen: Soft nontender  Neuro: Cranial nerves II through XII are intact, neurovascularly intact in all extremities with 2+ DTRs and 2+ pulses.  Lymph: No lymphadenopathy of posterior or anterior cervical chain or axillae bilaterally.  Gait antalgic gait MSK:  Non tender with full range of motion and good stability and symmetric strength and tone of shoulders, elbows, wrist, hip, and ankles bilaterally.  Knee: Bilateral Patella alta noted. Tender mostly over the patellofemoral joint. ROM full in flexion and extension and lower leg rotation. Ligaments with solid consistent endpoints including ACL, PCL, LCL, MCL. Minorly positive Mcmurray's, Apley's, and Thessalonian tests. Severely painful patellar compression. Patellar glide with severe crepitus. Patellar and quadriceps tendons unremarkable. Hamstring and quadriceps strength is normal.   MSK US performed of: Bilateral This study was ordered, performed, and interpreted by Charlann Boxer D.O.  Knee: All structures visualized. Anteromedial, anterolateral, posteromedial, and posterolateral menisci unremarkable without tearing, fraying, effusion, or displacement. No significant arthritis noted of the medial or lateral but severe patellofemoral osteophytic changes. Patellar Tendon unremarkable on long and transverse views without effusion. No abnormality of prepatellar bursa. LCL and MCL unremarkable on long and transverse views. No abnormality of origin of medial or lateral head of the gastrocnemius.  IMPRESSION:  Severe bilateral patellofemoral arthritis  After informed written and verbal consent, patient was seated on exam table. Right knee was prepped with alcohol swab and utilizing anterolateral approach, patient's right knee space was injected with 4:1  marcaine 0.5%: Kenalog 40mg /dL. Patient tolerated the procedure well without immediate complications.  After informed written and verbal consent, patient was seated on exam  table. Left knee was prepped with alcohol swab and utilizing anterolateral approach, patient's left knee space was injected with 4:1  marcaine 0.5%: Kenalog 40mg /dL. Patient tolerated the procedure well without immediate complications.   Impression and Recommendations:     This case required medical decision making of moderate complexity.

## 2014-12-02 NOTE — Patient Instructions (Addendum)
/  Good to see you.   Happy holidays!  Ice 20 minutes 2 times daily. Usually after activity and before bed. Consider knee pads with activity.  Take tylenol 650 mg three times a day is the best evidence based medicine we have for arthritis.  Glucosamine sulfate 750mg  twice a day is a supplement that has been shown to help moderate to severe arthritis. Vitamin D 2000 IU daily Fish oil 2 grams daily.  Tumeric 500mg  twice daily.  Capsaicin topically up to four times a day may also help with pain. If cortisone injections do not help, there are different types of shots that may help but they take longer to take effect.  We can discuss this at follow up.  It's important that you continue to stay active. Controlling your weight is important.  Consider physical therapy to strengthen muscles around the joint that hurts to take pressure off of the joint itself. Shoe inserts with good arch support may be helpful.  [employer] orth[facility] at [employer] [facility] could help.  Water aerobics and cycling with low resistance are the best two types of exercise for arthritis. Come back and see me in 3 weeks.

## 2014-12-02 NOTE — Assessment & Plan Note (Signed)
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 We discussed the possibility of bracing which patient declined. We also discussed icing regimen. Patient was given home exercises which I think would be beneficial. Injections were given today which did help some. I do think that there is some radiculopathy from patient's chronic back symptoms that could also be contributing. Patient is going to try to make these changes and we discussed over-the-counter medications as well. Patient and will come back and see me in 3 weeks for further evaluation and treatment.

## 2014-12-23 ENCOUNTER — Ambulatory Visit: Payer: Medicare Other | Admitting: Internal Medicine

## 2014-12-23 ENCOUNTER — Encounter: Payer: Self-pay | Admitting: Family Medicine

## 2014-12-23 ENCOUNTER — Ambulatory Visit (INDEPENDENT_AMBULATORY_CARE_PROVIDER_SITE_OTHER): Payer: Medicare Other | Admitting: Family Medicine

## 2014-12-23 VITALS — BP 118/64 | HR 72 | Ht 74.0 in | Wt 246.0 lb

## 2014-12-23 DIAGNOSIS — M13169 Monoarthritis, not elsewhere classified, unspecified knee: Secondary | ICD-10-CM

## 2014-12-23 NOTE — Patient Instructions (Signed)
Good to see you You are doing great Try the brace, I think it will help.  Only wear it during the day.  Ice 20 minutes 2 times daily. Usually after activity and before bed. Try the pennsaid pinkie amount on the knee before bed.  We can get a prescription if you need it.   Start the exercises See me again in 4-6 weeks.

## 2014-12-23 NOTE — Progress Notes (Signed)
  [facility] [address], [place] [address] Phone: [phone] Subjective:    I'm seeing this patient by the request  of:  Cathlean Cower, MD   CC: Bilateral knee pain follow up  BUY:[id number] Ryan Davila is a 64 y.o. male coming in with complaint of bilateral knee pain.  Patient was seen previously and was diagnosed with patellofemoral arthritis bilaterally. Patient elected to have steroid injections. Patient states that he is approximately 85% better. Patient has been doing the icing protocol but continues to take his pain medication as well. Patient has not been doing home exercises. Patient states going up and down stairs is significantly better. Mild discomfort still noted on the left knee. States that with him walking better his back is gotten somewhat better as well. Patient continues a methadone treatment as well.       Past medical history, social, surgical and family history all reviewed in electronic medical record.   Review of Systems: No headache, visual changes, nausea, vomiting, diarrhea, constipation, dizziness, abdominal pain, skin rash, fevers, chills, night sweats, weight loss, swollen lymph nodes, body aches, joint swelling, muscle aches, chest pain, shortness of breath, mood changes.   Objective Blood pressure 118/64, pulse 72, height 6\' 2"  (1.88 m), weight 246 lb (111.585 kg), SpO2 97 %.  General: No apparent distress alert and oriented x3 mood and affect normal, dressed appropriately.  HEENT: Pupils equal, extraocular movements intact  Respiratory: Patient's speak in full sentences and does not appear short of breath  Cardiovascular: No lower extremity edema, non tender, no erythema  Skin: Warm dry intact with no signs of infection or rash on extremities or on axial skeleton.  Abdomen: Soft nontender  Neuro: Cranial nerves II through XII are intact, neurovascularly intact in all extremities with 2+ DTRs and 2+ pulses.  Lymph:  No lymphadenopathy of posterior or anterior cervical chain or axillae bilaterally.  Gait antalgic gait MSK:  Non tender with full range of motion and good stability and symmetric strength and tone of shoulders, elbows, wrist, hip, and ankles bilaterally.  Knee: Bilateral Patella alta noted. Tender mostly over the patellofemoral joint mostly on the left side ROM full in flexion and extension and lower leg rotation. Ligaments with solid consistent endpoints including ACL, PCL, LCL, MCL. Negative Mcmurray's, Apley's, and Thessalonian tests. Severely painful patellar compression. Patellar glide with severe crepitus. Patellar and quadriceps tendons unremarkable. Hamstring and quadriceps strength is normal.      Impression and Recommendations:     This case required medical decision making of moderate complexity.

## 2014-12-23 NOTE — Assessment & Plan Note (Signed)
Patient did respond very well to the injections. Patient's was given topical anti-inflammatories and this was prescribed as well today. We also discussed icing protocol and as well as home exercises. We discussed formal physical therapy which patient declined. In addition of this patient was fitted with a brace today and showed proper way to wear it. Also discussed proper shoewear. Patient and will come back and see me again in 6 weeks for further evaluation. At that time we can consider another crit a steroid injection. Patient could be a candidate for viscous supplementation if he continues. Patient's medial and lateral compartment on x-rays were fairly unremarkable.

## 2014-12-29 ENCOUNTER — Other Ambulatory Visit: Payer: Self-pay

## 2014-12-29 MED ORDER — CARVEDILOL 12.5 MG PO TABS: 12.5000 mg | ORAL_TABLET | Freq: Two times a day (BID) | ORAL | Status: DC

## 2015-01-26 DIAGNOSIS — K629 Disease of anus and rectum, unspecified: Secondary | ICD-10-CM | POA: Diagnosis not present

## 2015-01-26 DIAGNOSIS — G894 Chronic pain syndrome: Secondary | ICD-10-CM | POA: Diagnosis not present

## 2015-01-26 DIAGNOSIS — Z79891 Long term (current) use of opiate analgesic: Secondary | ICD-10-CM | POA: Diagnosis not present

## 2015-01-26 DIAGNOSIS — M179 Osteoarthritis of knee, unspecified: Secondary | ICD-10-CM | POA: Diagnosis not present

## 2015-02-02 ENCOUNTER — Ambulatory Visit: Payer: Medicare Other | Admitting: Family Medicine

## 2015-02-03 ENCOUNTER — Ambulatory Visit: Payer: Medicare Other | Admitting: Family Medicine

## 2015-02-03 DIAGNOSIS — N281 Cyst of kidney, acquired: Secondary | ICD-10-CM | POA: Diagnosis not present

## 2015-02-03 DIAGNOSIS — Z85038 Personal history of other malignant neoplasm of large intestine: Secondary | ICD-10-CM | POA: Diagnosis not present

## 2015-02-03 DIAGNOSIS — M5137 Other intervertebral disc degeneration, lumbosacral region: Secondary | ICD-10-CM | POA: Diagnosis not present

## 2015-02-03 DIAGNOSIS — Z08 Encounter for follow-up examination after completed treatment for malignant neoplasm: Secondary | ICD-10-CM | POA: Diagnosis not present

## 2015-02-03 DIAGNOSIS — Z79899 Other long term (current) drug therapy: Secondary | ICD-10-CM | POA: Diagnosis not present

## 2015-02-03 DIAGNOSIS — C189 Malignant neoplasm of colon, unspecified: Secondary | ICD-10-CM | POA: Diagnosis not present

## 2015-02-03 DIAGNOSIS — R918 Other nonspecific abnormal finding of lung field: Secondary | ICD-10-CM | POA: Diagnosis not present

## 2015-03-23 DIAGNOSIS — M179 Osteoarthritis of knee, unspecified: Secondary | ICD-10-CM | POA: Diagnosis not present

## 2015-03-23 DIAGNOSIS — G894 Chronic pain syndrome: Secondary | ICD-10-CM | POA: Diagnosis not present

## 2015-03-23 DIAGNOSIS — Z79891 Long term (current) use of opiate analgesic: Secondary | ICD-10-CM | POA: Diagnosis not present

## 2015-03-23 DIAGNOSIS — K629 Disease of anus and rectum, unspecified: Secondary | ICD-10-CM | POA: Diagnosis not present

## 2015-04-07 ENCOUNTER — Encounter: Payer: Self-pay | Admitting: Internal Medicine

## 2015-04-07 ENCOUNTER — Ambulatory Visit (INDEPENDENT_AMBULATORY_CARE_PROVIDER_SITE_OTHER): Payer: Medicare Other | Admitting: Internal Medicine

## 2015-04-07 ENCOUNTER — Encounter (INDEPENDENT_AMBULATORY_CARE_PROVIDER_SITE_OTHER): Payer: Medicare Other

## 2015-04-07 VITALS — BP 170/80 | HR 75 | Temp 98.6°F | Ht 74.0 in | Wt 260.0 lb

## 2015-04-07 DIAGNOSIS — R635 Abnormal weight gain: Secondary | ICD-10-CM

## 2015-04-07 DIAGNOSIS — L989 Disorder of the skin and subcutaneous tissue, unspecified: Secondary | ICD-10-CM | POA: Diagnosis not present

## 2015-04-07 DIAGNOSIS — R5383 Other fatigue: Secondary | ICD-10-CM

## 2015-04-07 DIAGNOSIS — R1900 Intra-abdominal and pelvic swelling, mass and lump, unspecified site: Secondary | ICD-10-CM

## 2015-04-07 DIAGNOSIS — I10 Essential (primary) hypertension: Secondary | ICD-10-CM

## 2015-04-07 DIAGNOSIS — R198 Other specified symptoms and signs involving the digestive system and abdomen: Secondary | ICD-10-CM | POA: Insufficient documentation

## 2015-04-07 LAB — CBC WITH DIFFERENTIAL/PLATELET
BASOS ABS: 0 10*3/uL (ref 0.0–0.1)
Basophils Relative: 0.4 % (ref 0.0–3.0)
EOS PCT: 2.9 % (ref 0.0–5.0)
Eosinophils Absolute: 0.2 10*3/uL (ref 0.0–0.7)
HEMATOCRIT: 41.2 % (ref 39.0–52.0)
Hemoglobin: 14.3 g/dL (ref 13.0–17.0)
LYMPHS ABS: 2.1 10*3/uL (ref 0.7–4.0)
Lymphocytes Relative: 29.8 % (ref 12.0–46.0)
MCHC: 34.7 g/dL (ref 30.0–36.0)
MCV: 90.4 fl (ref 78.0–100.0)
MONO ABS: 0.7 10*3/uL (ref 0.1–1.0)
MONOS PCT: 9.3 % (ref 3.0–12.0)
Neutro Abs: 4.1 10*3/uL (ref 1.4–7.7)
Neutrophils Relative %: 57.6 % (ref 43.0–77.0)
Platelets: 218 10*3/uL (ref 150.0–400.0)
RBC: 4.56 Mil/uL (ref 4.22–5.81)
RDW: 13.1 % (ref 11.5–15.5)
WBC: 7.2 10*3/uL (ref 4.0–10.5)

## 2015-04-07 LAB — URINALYSIS, ROUTINE W REFLEX MICROSCOPIC
Hgb urine dipstick: NEGATIVE
KETONES UR: NEGATIVE
Leukocytes, UA: NEGATIVE
Nitrite: NEGATIVE
Specific Gravity, Urine: 1.025 (ref 1.000–1.030)
TOTAL PROTEIN, URINE-UPE24: NEGATIVE
URINE GLUCOSE: NEGATIVE
UROBILINOGEN UA: 1 (ref 0.0–1.0)
pH: 6 (ref 5.0–8.0)

## 2015-04-07 LAB — BASIC METABOLIC PANEL
BUN: 14 mg/dL (ref 6–23)
CO2: 29 meq/L (ref 19–32)
CREATININE: 1.11 mg/dL (ref 0.40–1.50)
Calcium: 9.2 mg/dL (ref 8.4–10.5)
Chloride: 103 mEq/L (ref 96–112)
GFR: 71.23 mL/min (ref >60.00)
Glucose, Bld: 114 mg/dL — ABNORMAL HIGH (ref 70–99)
POTASSIUM: 4.4 meq/L (ref 3.5–5.1)
SODIUM: 139 meq/L (ref 135–145)

## 2015-04-07 LAB — HEPATIC FUNCTION PANEL
ALBUMIN: 4.1 g/dL (ref 3.5–5.2)
ALK PHOS: 51 U/L (ref 39–117)
ALT: 21 U/L (ref 0–53)
AST: 21 U/L (ref 0–37)
Bilirubin, Direct: 0.1 mg/dL (ref 0.0–0.3)
TOTAL PROTEIN: 7.1 g/dL (ref 6.0–8.3)
Total Bilirubin: 0.6 mg/dL (ref 0.2–1.2)

## 2015-04-07 LAB — TSH: TSH: 1.83 u[IU]/mL (ref 0.35–4.50)

## 2015-04-07 MED ORDER — ALBUTEROL SULFATE HFA 108 (90 BASE) MCG/ACT IN AERS: 2.0000 | INHALATION_SPRAY | Freq: Four times a day (QID) | RESPIRATORY_TRACT | Status: DC | PRN

## 2015-04-07 NOTE — Assessment & Plan Note (Signed)
Somewhat labile, to cont current meds, nervous today, f/u BP at home and next visit

## 2015-04-07 NOTE — Assessment & Plan Note (Signed)
For abd u/s and LFT/s - r/o ascites

## 2015-04-07 NOTE — Progress Notes (Signed)
Pre visit review using our clinic review tool, if applicable. No additional management support is needed unless otherwise documented below in the visit note. 

## 2015-04-07 NOTE — Assessment & Plan Note (Signed)
I think more likely fatty wt gain, but cant r/o ascites, for increased acitivity, less calories

## 2015-04-07 NOTE — Patient Instructions (Addendum)
Please continue all other medications as before, and refills have been done if requested.  Please have the pharmacy call with any other refills you may need.  Please continue your efforts at being more active, low cholesterol diet, and weight control.  You are otherwise up to date with prevention measures today.  Please keep your appointments with your specialists as you may have planned  You will be contacted regarding the referral for: abdomen ultrasound, and the dermatology  Please go to the LAB in the Basement (turn left off the elevator) for the tests to be done today  /You will be contacted by phone if any changes need to be made immediately.  Otherwise, you will receive a letter about your results with an explanation, but please check with MyChart first.  Please remember to sign up for MyChart if you have not done so, as this will be important to you in the future with finding out test results, communicating by private email, and scheduling acute appointments online when needed.

## 2015-04-07 NOTE — Progress Notes (Signed)
Subjective:    Patient ID: Ryan Davila, male    DOB: [birth year], 64 y.o.   MRN: [id number]  HPI  Here to f/u, co wt gain despite no significant change in diet, gained 14 lbs since last visit.  No change overall in activity.  Walking now 2.5 miles per day, but wt cont's to increase. Wt Readings from Last 3 Encounters:  04/07/15 260 lb (117.935 kg)  12/23/14 246 lb (111.585 kg)  12/02/14 244 lb (110.678 kg)  Sees a pain doctor who suggested that maybe there might be ascites in the abdomen, to pt feels some bloated.   Pt denies chest pain, increased sob or doe, wheezing, orthopnea, PND, increased LE swelling, palpitations, dizziness or syncope.  Did have knee tendonitis s/p coritsone and brace and resolved. Also with numerous skin tags to axillas, asks for removal.  Does c/o ongoing fatigue, but denies signficant daytime hypersomnolence.  Past Medical History  Diagnosis Date  . Impaired glucose tolerance 04/23/2011  . COLON CANCER, HX OF 04/18/2008  . ALLERGIC RHINITIS 04/18/2008  . DEPRESSION 04/18/2008  . GERD 04/18/2008  . HYPERLIPIDEMIA 04/18/2008  . HYPERTENSION 04/18/2008  . HEPATITIS B, HX OF 04/18/2008  . Allergy   . Cancer     colon cancer  . Blood transfusion without reported diagnosis     as infant   Past Surgical History  Procedure Laterality Date  . Appendectomy    . Rotator cuff surgery    . Tonsillectomy    . Repair flexor tendon hand    . Rectal surgery  2003    AP resection  . 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 Colonoscopy    . Colostomy    . Polypectomy      reports that he has quit smoking. He has quit using smokeless tobacco. He reports that he drinks alcohol. He reports that he does not use illicit drugs. family history includes Breast cancer in his maternal aunt; Colon cancer in his maternal grandfather; Diabetes in his father and sister; Prostate cancer in his maternal uncle. No Known Allergies Current Outpatient Prescriptions on  File Prior to Visit  Medication Sig Dispense Refill  . aspirin EC 81 MG tablet Take by mouth daily.     . AZOR 5-40 MG per tablet TAKE 1 TABLET BY MOUTH EVERY DAY 90 tablet 3  . carvedilol (COREG) 12.5 MG tablet Take 1 tablet (12.5 mg total) by mouth 2 (two) times daily with a meal. 180 tablet 3  . HYDROcodone-acetaminophen (VICODIN) 5-500 MG per tablet Take 1 tablet by mouth every 8 (eight) hours as needed.     . latanoprost (XALATAN) 0.005 % ophthalmic solution Place 1 drop into both eyes at bedtime. 0.005 %    . lovastatin (MEVACOR) 40 MG tablet Take 1 tablet (40 mg total) by mouth daily. 90 tablet 3  . methadone (DOLOPHINE) 5 MG tablet Take 5 mg by mouth daily. 5mg  taken 5 times a day. Total 25mg  per day.    . mirtazapine (REMERON) 15 MG tablet Take 1 tablet (15 mg total) by mouth at bedtime. 90 tablet 3  . tamsulosin (FLOMAX) 0.4 MG CAPS capsule Take 1 capsule (0.4 mg total) by mouth daily. 90 capsule 3  . triamcinolone cream (KENALOG) 0.1 % Apply topically 2 (two) times daily. 30 g 0  . nitroGLYCERIN (NITROSTAT) 0.4 MG SL tablet Place under the tongue.     No current facility-administered medications on  file prior to visit.    Review of Systems  Constitutional: Negative for unusual diaphoresis or night sweats HENT: Negative for ringing in ear or discharge Eyes: Negative for double vision or worsening visual disturbance.  Respiratory: Negative for choking and stridor.   Gastrointestinal: Negative for vomiting or other signifcant bowel change Genitourinary: Negative for hematuria or change in urine volume.  Musculoskeletal: Negative for other MSK pain or swelling Skin: Negative for color change and worsening wound.  Neurological: Negative for tremors and numbness other than noted  Psychiatric/Behavioral: Negative for decreased concentration or agitation other than above  '    Objective:   Physical Exam BP 170/80 mmHg  Pulse 75  Temp(Src) 98.6 F (37 C) (Oral)  Ht 6\' 2"  (1.88 m)   Wt 260 lb (117.935 kg)  BMI 33.37 kg/m2  SpO2 97% VS noted,  BP Readings from Last 3 Encounters:  04/07/15 170/80  12/23/14 118/64  12/02/14 142/80   Constitutional: Pt appears in no significant distress HENT: Head: NCAT.  Right Ear: External ear normal.  Left Ear: External ear normal.  Eyes: . Pupils are equal, round, and reactive to light. Conjunctivae and EOM are normal Neck: Normal range of motion. Neck supple.  Cardiovascular: Normal rate and regular rhythm.   Pulmonary/Chest: Effort normal and breath sounds without rales or wheezing.  Abd:  Soft, NT, ND, + BS., obese, no obvious fluid wave Neurological: Pt is alert. Not confused , motor grossly intact Skin: Skin is warm. No rash, no LE edema, mutl skin tags to axillas Psychiatric: Pt behavior is normal. No agitation.     Assessment & Plan:

## 2015-04-07 NOTE — Assessment & Plan Note (Signed)
Etiology unclear, Exam otherwise benign, to check labs as documented, follow with expectant management  

## 2015-04-07 NOTE — Assessment & Plan Note (Signed)
Multiple skin tags - for derm referral

## 2015-04-13 ENCOUNTER — Ambulatory Visit
Admission: RE | Admit: 2015-04-13 | Discharge: 2015-04-13 | Disposition: A | Discharge status: Home / Self Care | Payer: Medicare Other | Source: Ambulatory Visit | Attending: Internal Medicine | Admitting: Internal Medicine

## 2015-04-13 DIAGNOSIS — Z8505 Personal history of malignant neoplasm of liver: Secondary | ICD-10-CM | POA: Diagnosis not present

## 2015-04-13 DIAGNOSIS — R635 Abnormal weight gain: Secondary | ICD-10-CM

## 2015-04-13 DIAGNOSIS — Z85038 Personal history of other malignant neoplasm of large intestine: Secondary | ICD-10-CM | POA: Diagnosis not present

## 2015-04-13 DIAGNOSIS — R5383 Other fatigue: Secondary | ICD-10-CM

## 2015-04-13 DIAGNOSIS — R198 Other specified symptoms and signs involving the digestive system and abdomen: Secondary | ICD-10-CM

## 2015-05-04 ENCOUNTER — Telehealth: Payer: Self-pay | Admitting: Internal Medicine

## 2015-05-04 NOTE — Telephone Encounter (Signed)
Patient wife called in regards to ultra sound results.  Patient can not get into my chart to see messages.  I did give my chart number to wife to give to patient to get back into mychart. Wife would like a call back in regards to what results were.

## 2015-05-04 NOTE — Telephone Encounter (Signed)
Called patient's wife back and gave results over the phone.

## 2015-05-18 DIAGNOSIS — K629 Disease of anus and rectum, unspecified: Secondary | ICD-10-CM | POA: Diagnosis not present

## 2015-05-18 DIAGNOSIS — Z79891 Long term (current) use of opiate analgesic: Secondary | ICD-10-CM | POA: Diagnosis not present

## 2015-05-18 DIAGNOSIS — G894 Chronic pain syndrome: Secondary | ICD-10-CM | POA: Diagnosis not present

## 2015-05-18 DIAGNOSIS — K5900 Constipation, unspecified: Secondary | ICD-10-CM | POA: Diagnosis not present

## 2015-06-01 ENCOUNTER — Ambulatory Visit (INDEPENDENT_AMBULATORY_CARE_PROVIDER_SITE_OTHER): Payer: Medicare Other | Admitting: Internal Medicine

## 2015-06-01 ENCOUNTER — Encounter: Payer: Self-pay | Admitting: Internal Medicine

## 2015-06-01 VITALS — BP 152/78 | HR 84 | Temp 97.6°F | Ht 74.0 in | Wt 259.0 lb

## 2015-06-01 DIAGNOSIS — R7302 Impaired glucose tolerance (oral): Secondary | ICD-10-CM | POA: Diagnosis not present

## 2015-06-01 DIAGNOSIS — I10 Essential (primary) hypertension: Secondary | ICD-10-CM | POA: Diagnosis not present

## 2015-06-01 DIAGNOSIS — R635 Abnormal weight gain: Secondary | ICD-10-CM | POA: Diagnosis not present

## 2015-06-01 DIAGNOSIS — K5900 Constipation, unspecified: Secondary | ICD-10-CM

## 2015-06-01 MED ORDER — CARVEDILOL 25 MG PO TABS: 25.0000 mg | ORAL_TABLET | Freq: Two times a day (BID) | ORAL | Status: DC

## 2015-06-01 NOTE — Assessment & Plan Note (Signed)
Uncontrolled, mild, for increase coreg to 25 bid, cont to monitor, consider incr azor to 10/40 , o/w cont all other tx, wt loss efforts, low salt BP Readings from Last 3 Encounters:  06/01/15 152/78  04/07/15 170/80  12/23/14 118/64

## 2015-06-01 NOTE — Assessment & Plan Note (Signed)
Unspecified, likely related to less active, and chronic narcotic, for add miralax daily to stool softener regimen

## 2015-06-01 NOTE — Assessment & Plan Note (Signed)
stable overall by history and exam, recent data reviewed with pt, and pt to continue medical treatment as before,  to f/u any worsening symptoms or concerns Lab Results  Component Value Date   WBC 7.2 04/07/2015   HGB 14.3 04/07/2015   HCT 41.2 04/07/2015   PLT 218.0 04/07/2015   GLUCOSE 114* 04/07/2015   CHOL 170 11/25/2014   TRIG 345.0* 11/25/2014   HDL 34.40* 11/25/2014   LDLDIRECT 95.6 11/25/2014   LDLCALC 90 08/09/2013   ALT 21 04/07/2015   AST 21 04/07/2015   NA 139 04/07/2015   K 4.4 04/07/2015   CL 103 04/07/2015   CREATININE 1.11 04/07/2015   BUN 14 04/07/2015   CO2 29 04/07/2015   TSH 1.83 04/07/2015   PSA 0.34 11/25/2014   INR 0.84 02/22/2010   HGBA1C 5.8 11/25/2014   To cont wt loss efforts

## 2015-06-01 NOTE — Assessment & Plan Note (Signed)
No overt reason except I suspect he has been less active recently more than he admits and maintained calorie intact, cont efforts at reduced  calories

## 2015-06-01 NOTE — Progress Notes (Signed)
Pre visit review using our clinic review tool, if applicable. No additional management support is needed unless otherwise documented below in the visit note. 

## 2015-06-01 NOTE — Progress Notes (Signed)
Subjective:    Patient ID: Ryan Davila, male    DOB: 08-Jul-1951, 64 y.o.   MRN: [id number]  HPI  Here to f/u recent wt gain despite no change in activity or diet by his report, Denies worsening reflux, abd pain, dysphagia, n/v, bowel change or blood, but feels bloated and more constipated recently, with less results with bowel irrigation than recent for 2 months, feels miserable. On chronic pain medication since 2004, but constipation only worse recently.  Has seen Dr Kaplan/GI with last colonscopy, 2 polyps removed nonmalignant, for f/u at 6 yrs.Most Recent wt little changed   Mostly sedentary now due ot chronic pain  Pt denies chest pain, increased sob or doe, wheezing, orthopnea, PND, increased LE swelling, palpitations, dizziness or syncope.  Pt denies new neurological symptoms such as new headache, or facial or extremity weakness or numbness Wt Readings from Last 3 Encounters:  06/01/15 259 lb (117.482 kg)  04/07/15 260 lb (117.935 kg)  12/23/14 246 lb (111.585 kg)   Past Medical History  Diagnosis Date  . Impaired glucose tolerance 04/23/2011  . COLON CANCER, HX OF 04/18/2008  . ALLERGIC RHINITIS 04/18/2008  . DEPRESSION 04/18/2008  . GERD 04/18/2008  . HYPERLIPIDEMIA 04/18/2008  . HYPERTENSION 04/18/2008  . HEPATITIS B, HX OF 04/18/2008  . Allergy   . Cancer     colon cancer  . Blood transfusion without reported diagnosis     as infant   Past Surgical History  Procedure Laterality Date  . Appendectomy    . Rotator cuff surgery    . Tonsillectomy    . Repair flexor tendon hand    . Rectal surgery  2003    AP resection  . 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 Colonoscopy    . Colostomy    . Polypectomy      reports that he has quit smoking. He has quit using smokeless tobacco. He reports that he drinks alcohol. He reports that he does not use illicit drugs. family history includes Breast cancer in his maternal aunt; Colon cancer in his maternal  grandfather; Diabetes in his father and sister; Prostate cancer in his maternal uncle. No Known Allergies Current Outpatient Prescriptions on File Prior to Visit  Medication Sig Dispense Refill  . albuterol (PROAIR HFA) 108 (90 BASE) MCG/ACT inhaler Inhale 2 puffs into the lungs every 6 (six) hours as needed for wheezing. 3 Inhaler 3  . aspirin EC 81 MG tablet Take by mouth daily.     . AZOR 5-40 MG per tablet TAKE 1 TABLET BY MOUTH EVERY DAY 90 tablet 3  . carvedilol (COREG) 12.5 MG tablet Take 1 tablet (12.5 mg total) by mouth 2 (two) times daily with a meal. 180 tablet 3  . HYDROcodone-acetaminophen (VICODIN) 5-500 MG per tablet Take 1 tablet by mouth every 8 (eight) hours as needed.     . latanoprost (XALATAN) 0.005 % ophthalmic solution Place 1 drop into both eyes at bedtime. 0.005 %    . lovastatin (MEVACOR) 40 MG tablet Take 1 tablet (40 mg total) by mouth daily. 90 tablet 3  . methadone (DOLOPHINE) 5 MG tablet Take 5 mg by mouth daily. 5mg  taken 5 times a day. Total 25mg  per day.    . mirtazapine (REMERON) 15 MG tablet Take 1 tablet (15 mg total) by mouth at bedtime. 90 tablet 3  . tamsulosin (FLOMAX) 0.4 MG CAPS capsule Take 1 capsule (0.4 mg  total) by mouth daily. 90 capsule 3  . triamcinolone cream (KENALOG) 0.1 % Apply topically 2 (two) times daily. 30 g 0  . nitroGLYCERIN (NITROSTAT) 0.4 MG SL tablet Place under the tongue.     No current facility-administered medications on file prior to visit.   Review of Systems  Constitutional: Negative for unusual diaphoresis or night sweats HENT: Negative for ringing in ear or discharge Eyes: Negative for double vision or worsening visual disturbance.  Respiratory: Negative for choking and stridor.   Gastrointestinal: Negative for vomiting or other signifcant bowel change Genitourinary: Negative for hematuria or change in urine volume.  Musculoskeletal: Negative for other MSK pain or swelling Skin: Negative for color change and worsening  wound.  Neurological: Negative for tremors and numbness other than noted  Psychiatric/Behavioral: Negative for decreased concentration or agitation other than above       Objective:   Physical Exam BP 152/78 mmHg  Pulse 84  Temp(Src) 97.6 F (36.4 C) (Oral)  Ht 6\' 2"  (1.88 m)  Wt 259 lb (117.482 kg)  BMI 33.24 kg/m2  SpO2 96%  VS noted,  Constitutional: Pt appears in no significant distress HENT: Head: NCAT.  Right Ear: External ear normal.  Left Ear: External ear normal.  Eyes: . Pupils are equal, round, and reactive to light. Conjunctivae and EOM are normal Neck: Normal range of motion. Neck supple.  Cardiovascular: Normal rate and regular rhythm.   Pulmonary/Chest: Effort normal and breath sounds without rales or wheezing.  Abd:  Soft, NT, ND, + BS Neurological: Pt is alert. Not confused , motor grossly intact Skin: Skin is warm. No rash, no LE edema Psychiatric: Pt behavior is normal. No agitation.      Assessment & Plan:

## 2015-06-01 NOTE — Patient Instructions (Addendum)
OK to add the miralax OTC daily   OK to increase the coreg to 25 mg twice per day  Please continue all other medications as before, and refills have been done if requested.  Please have the pharmacy call with any other refills you may need.  Please continue your efforts at being more active, low cholesterol diet, and weight control.  Please continue to monitor you Blood pressure at home, and return in 2-4 wks if still more than 140/90  Please keep your appointments with your specialists as you may have planned  Please return in 6 months, or sooner if needed

## 2015-07-13 DIAGNOSIS — K629 Disease of anus and rectum, unspecified: Secondary | ICD-10-CM | POA: Diagnosis not present

## 2015-07-13 DIAGNOSIS — Z79891 Long term (current) use of opiate analgesic: Secondary | ICD-10-CM | POA: Diagnosis not present

## 2015-07-13 DIAGNOSIS — K5900 Constipation, unspecified: Secondary | ICD-10-CM | POA: Diagnosis not present

## 2015-07-13 DIAGNOSIS — G894 Chronic pain syndrome: Secondary | ICD-10-CM | POA: Diagnosis not present

## 2015-07-24 ENCOUNTER — Other Ambulatory Visit (INDEPENDENT_AMBULATORY_CARE_PROVIDER_SITE_OTHER): Payer: Medicare Other

## 2015-07-24 ENCOUNTER — Encounter: Payer: Self-pay | Admitting: Internal Medicine

## 2015-07-24 ENCOUNTER — Ambulatory Visit (INDEPENDENT_AMBULATORY_CARE_PROVIDER_SITE_OTHER): Payer: Medicare Other | Admitting: Internal Medicine

## 2015-07-24 VITALS — BP 138/78 | HR 62 | Temp 97.8°F | Resp 16 | Wt 259.0 lb

## 2015-07-24 DIAGNOSIS — M79661 Pain in right lower leg: Secondary | ICD-10-CM | POA: Diagnosis not present

## 2015-07-24 DIAGNOSIS — M79662 Pain in left lower leg: Secondary | ICD-10-CM | POA: Diagnosis not present

## 2015-07-24 LAB — BASIC METABOLIC PANEL
BUN: 13 mg/dL (ref 6–23)
CO2: 27 mEq/L (ref 19–32)
CREATININE: 0.98 mg/dL (ref 0.40–1.50)
Calcium: 9.4 mg/dL (ref 8.4–10.5)
Chloride: 102 mEq/L (ref 96–112)
GFR: 82.17 mL/min (ref >60.00)
Glucose, Bld: 118 mg/dL — ABNORMAL HIGH (ref 70–99)
Potassium: 4.2 mEq/L (ref 3.5–5.1)
Sodium: 136 mEq/L (ref 135–145)

## 2015-07-24 LAB — MAGNESIUM: Magnesium: 2 mg/dL (ref 1.5–2.5)

## 2015-07-24 LAB — CK: Total CK: 98 U/L (ref 7–232)

## 2015-07-24 NOTE — Progress Notes (Signed)
Pre visit review using our clinic review tool, if applicable. No additional management support is needed unless otherwise documented below in the visit note. 

## 2015-07-24 NOTE — Patient Instructions (Addendum)
Please perform isometric exercises for calf pain MagCal may help the muscle symptoms as we discussed.   Your next office appointment will be determined based upon review of your pending labs.  Those written interpretation of the lab results and instructions will be transmitted to you in by mail for your records.  Critical results will be called.   Followup as needed for any active or acute issue. Please report any significant change in your symptoms.

## 2015-07-24 NOTE — Progress Notes (Signed)
   Subjective:    Patient ID: Ryan Davila, male    DOB: [birth year], 64 y.o.   MRN: [id number]  HPI  He has had intermittent pain in the right calf for the last 1-2 months. It is intermittent without specific trigger. It can occur at rest or while walking. He notices occasional edema.  He is on a statin.  He has no other cardiopulmonary symptoms  His blood pressure does tend to run in the 170 range with a nonmanual cuff.   Review of Systems  Chest pain, palpitations, tachycardia, exertional dyspnea, paroxysmal nocturnal dyspnea, claudication are absent.  No hemoptysis present.       Objective:   Physical Exam  Pertinent or positive findings include: Pattern alopecia present. He has a Engineering geologist. Ostomy bag is present on the left. Trace edema is noted at the right ankle. Homans sign is negative. Pedal pulses are excellent.Homan's negative.  General appearance :adequately nourished; in no distress.  Eyes: No conjunctival inflammation or scleral icterus is present.  Heart:  Normal rate and regular rhythm. S1 and S2 normal without gallop, murmur, click, rub or other extra sounds    Lungs:Chest clear to auscultation; no wheezes, rhonchi,rales ,or rubs present.No increased work of breathing.   Abdomen: bowel sounds normal, soft and non-tender without masses, organomegaly or hernias noted.  No guarding or rebound.  Vascular : all pulses equal ; no bruits present.  Skin:Warm & dry.  Intact without suspicious lesions or rashes ; no tenting or jaundice   Lymphatic: No lymphadenopathy is noted about the head, neck, axilla  Neuro: Strength, tone & DTRs normal.       Assessment & Plan:  #1 calf pain, intermittent  Land: See orders and recommendations

## 2015-09-07 DIAGNOSIS — K5900 Constipation, unspecified: Secondary | ICD-10-CM | POA: Diagnosis not present

## 2015-09-07 DIAGNOSIS — K629 Disease of anus and rectum, unspecified: Secondary | ICD-10-CM | POA: Diagnosis not present

## 2015-09-07 DIAGNOSIS — G894 Chronic pain syndrome: Secondary | ICD-10-CM | POA: Diagnosis not present

## 2015-10-15 ENCOUNTER — Other Ambulatory Visit: Payer: Self-pay | Admitting: Internal Medicine

## 2015-11-08 DIAGNOSIS — G894 Chronic pain syndrome: Secondary | ICD-10-CM | POA: Diagnosis not present

## 2015-11-08 DIAGNOSIS — M25511 Pain in right shoulder: Secondary | ICD-10-CM | POA: Diagnosis not present

## 2015-11-08 DIAGNOSIS — Z79891 Long term (current) use of opiate analgesic: Secondary | ICD-10-CM | POA: Diagnosis not present

## 2015-11-08 DIAGNOSIS — K629 Disease of anus and rectum, unspecified: Secondary | ICD-10-CM | POA: Diagnosis not present

## 2015-11-15 ENCOUNTER — Other Ambulatory Visit: Payer: Self-pay | Admitting: Internal Medicine

## 2015-11-21 ENCOUNTER — Ambulatory Visit: Payer: Medicare Other | Admitting: Internal Medicine

## 2015-11-24 ENCOUNTER — Encounter: Payer: Self-pay | Admitting: Internal Medicine

## 2015-11-24 ENCOUNTER — Other Ambulatory Visit (INDEPENDENT_AMBULATORY_CARE_PROVIDER_SITE_OTHER): Payer: Medicare Other

## 2015-11-24 ENCOUNTER — Ambulatory Visit (INDEPENDENT_AMBULATORY_CARE_PROVIDER_SITE_OTHER): Payer: Medicare Other | Admitting: Internal Medicine

## 2015-11-24 VITALS — BP 164/100 | HR 92 | Temp 97.7°F | Ht 74.0 in | Wt 266.0 lb

## 2015-11-24 DIAGNOSIS — E785 Hyperlipidemia, unspecified: Secondary | ICD-10-CM | POA: Diagnosis not present

## 2015-11-24 DIAGNOSIS — I10 Essential (primary) hypertension: Secondary | ICD-10-CM | POA: Diagnosis not present

## 2015-11-24 DIAGNOSIS — R7302 Impaired glucose tolerance (oral): Secondary | ICD-10-CM

## 2015-11-24 DIAGNOSIS — N320 Bladder-neck obstruction: Secondary | ICD-10-CM | POA: Diagnosis not present

## 2015-11-24 DIAGNOSIS — J4520 Mild intermittent asthma, uncomplicated: Secondary | ICD-10-CM

## 2015-11-24 LAB — LDL CHOLESTEROL, DIRECT: Direct LDL: 86 mg/dL

## 2015-11-24 LAB — PSA: PSA: 0.23 ng/mL (ref 0.10–4.00)

## 2015-11-24 LAB — BASIC METABOLIC PANEL
BUN: 13 mg/dL (ref 6–23)
CO2: 26 mEq/L (ref 19–32)
Calcium: 9.2 mg/dL (ref 8.4–10.5)
Chloride: 105 mEq/L (ref 96–112)
Creatinine, Ser: 0.9 mg/dL (ref 0.40–1.50)
GFR: 90.55 mL/min (ref >60.00)
Glucose, Bld: 115 mg/dL — ABNORMAL HIGH (ref 70–99)
POTASSIUM: 3.9 meq/L (ref 3.5–5.1)
SODIUM: 141 meq/L (ref 135–145)

## 2015-11-24 LAB — URINALYSIS, ROUTINE W REFLEX MICROSCOPIC
BILIRUBIN URINE: NEGATIVE
HGB URINE DIPSTICK: NEGATIVE
KETONES UR: NEGATIVE
LEUKOCYTES UA: NEGATIVE
NITRITE: NEGATIVE
Specific Gravity, Urine: >=1.03 — AB (ref 1.000–1.030)
TOTAL PROTEIN, URINE-UPE24: NEGATIVE
URINE GLUCOSE: 100 — AB
Urobilinogen, UA: 0.2 (ref 0.0–1.0)
pH: 6 (ref 5.0–8.0)

## 2015-11-24 LAB — CBC WITH DIFFERENTIAL/PLATELET
BASOS PCT: 0.5 % (ref 0.0–3.0)
Basophils Absolute: 0 10*3/uL (ref 0.0–0.1)
EOS PCT: 2.4 % (ref 0.0–5.0)
Eosinophils Absolute: 0.2 10*3/uL (ref 0.0–0.7)
HEMATOCRIT: 43.1 % (ref 39.0–52.0)
HEMOGLOBIN: 14.6 g/dL (ref 13.0–17.0)
Lymphocytes Relative: 26.8 % (ref 12.0–46.0)
Lymphs Abs: 2 10*3/uL (ref 0.7–4.0)
MCHC: 33.9 g/dL (ref 30.0–36.0)
MCV: 89.9 fl (ref 78.0–100.0)
MONO ABS: 0.7 10*3/uL (ref 0.1–1.0)
Monocytes Relative: 8.6 % (ref 3.0–12.0)
NEUTROS ABS: 4.7 10*3/uL (ref 1.4–7.7)
Neutrophils Relative %: 61.7 % (ref 43.0–77.0)
Platelets: 224 10*3/uL (ref 150.0–400.0)
RBC: 4.8 Mil/uL (ref 4.22–5.81)
RDW: 13 % (ref 11.5–15.5)
WBC: 7.6 10*3/uL (ref 4.0–10.5)

## 2015-11-24 LAB — HEPATIC FUNCTION PANEL
ALT: 36 U/L (ref 0–53)
AST: 35 U/L (ref 0–37)
Albumin: 4.1 g/dL (ref 3.5–5.2)
Alkaline Phosphatase: 60 U/L (ref 39–117)
BILIRUBIN DIRECT: 0.1 mg/dL (ref 0.0–0.3)
BILIRUBIN TOTAL: 0.6 mg/dL (ref 0.2–1.2)
Total Protein: 6.6 g/dL (ref 6.0–8.3)

## 2015-11-24 LAB — LIPID PANEL
CHOL/HDL RATIO: 5
CHOLESTEROL: 163 mg/dL (ref 0–200)
HDL: 34.5 mg/dL — ABNORMAL LOW (ref >39.00)
NONHDL: 128.04
Triglycerides: 259 mg/dL — ABNORMAL HIGH (ref 0.0–149.0)
VLDL: 51.8 mg/dL — AB (ref 0.0–40.0)

## 2015-11-24 LAB — TSH: TSH: 1.08 u[IU]/mL (ref 0.35–4.50)

## 2015-11-24 LAB — HEMOGLOBIN A1C: Hgb A1c MFr Bld: 6.2 % (ref 4.6–6.5)

## 2015-11-24 MED ORDER — HYDRALAZINE HCL 25 MG PO TABS: 25.0000 mg | ORAL_TABLET | Freq: Three times a day (TID) | ORAL | Status: DC

## 2015-11-24 MED ORDER — CARVEDILOL 25 MG PO TABS: 25.0000 mg | ORAL_TABLET | Freq: Two times a day (BID) | ORAL | Status: DC

## 2015-11-24 NOTE — Progress Notes (Signed)
Pre visit review using our clinic review tool, if applicable. No additional management support is needed unless otherwise documented below in the visit note. 

## 2015-11-24 NOTE — Progress Notes (Signed)
Subjective:    Patient ID: Ryan Davila, male    DOB: February 11, 1951, 64 y.o.   MRN: [id number]  HPI  Here to f/u; overall doing ok,  Pt denies chest pain, increasing sob or doe, wheezing, orthopnea, PND, increased LE swelling, palpitations, dizziness or syncope.  Pt denies new neurological symptoms such as new headache, or facial or extremity weakness or numbness.  Pt denies polydipsia, polyuria, or low sugar episode.   Pt denies new neurological symptoms such as new headache, or facial or extremity weakness or numbness.   Pt states overall good compliance with meds, mostly trying to follow appropriate diet, with wt overall stable,  but little exercise  Missed coreg x 3 days due to being out.  Wt Readings from Last 3 Encounters:  11/24/15 266 lb (120.657 kg)  07/24/15 259 lb (117.482 kg)  06/01/15 259 lb (117.482 kg)  Has gained several lbs with persistent elev BP even with taking the coreg. Declines flu shot.  Past Medical History  Diagnosis Date  . Impaired glucose tolerance 04/23/2011  . COLON CANCER, HX OF 04/18/2008  . ALLERGIC RHINITIS 04/18/2008  . DEPRESSION 04/18/2008  . GERD 04/18/2008  . HYPERLIPIDEMIA 04/18/2008  . HYPERTENSION 04/18/2008  . HEPATITIS B, HX OF 04/18/2008  . Allergy   . Cancer [facility])     colon cancer  . Blood transfusion without reported diagnosis     as infant   Past Surgical History  Procedure Laterality Date  . Appendectomy    . Rotator cuff surgery    . Tonsillectomy    . Repair flexor tendon hand    . Rectal surgery  2003    AP resection  . 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 Colonoscopy    . Colostomy    . Polypectomy      reports that he has quit smoking. He has quit using smokeless tobacco. He reports that he drinks alcohol. He reports that he does not use illicit drugs. family history includes Breast cancer in his maternal aunt; Colon cancer in his maternal grandfather; Diabetes in his father and sister; Prostate cancer in  his maternal uncle. No Known Allergies Current Outpatient Prescriptions on File Prior to Visit  Medication Sig Dispense Refill  . albuterol (PROAIR HFA) 108 (90 BASE) MCG/ACT inhaler Inhale 2 puffs into the lungs every 6 (six) hours as needed for wheezing. 3 Inhaler 3  . aspirin EC 81 MG tablet Take by mouth daily.     . AZOR 5-40 MG tablet TAKE 1 TABLET BY MOUTH EVERY DAY 90 tablet 2  . HYDROcodone-acetaminophen (VICODIN) 5-500 MG per tablet Take 1 tablet by mouth every 8 (eight) hours as needed.     . latanoprost (XALATAN) 0.005 % ophthalmic solution Place 1 drop into both eyes at bedtime. 0.005 %    . lovastatin (MEVACOR) 40 MG tablet TAKE 1 TABLET (40 MG TOTAL) BY MOUTH DAILY. 90 tablet 1  . methadone (DOLOPHINE) 5 MG tablet Take 5 mg by mouth daily. 5mg  taken 5 times a day. Total 25mg  per day.    . mirtazapine (REMERON) 15 MG tablet Take 1 tablet (15 mg total) by mouth at bedtime. 90 tablet 3  . tamsulosin (FLOMAX) 0.4 MG CAPS capsule TAKE 1 CAPSULE (0.4 MG TOTAL) BY MOUTH DAILY. 90 capsule 1  . triamcinolone cream (KENALOG) 0.1 % Apply topically 2 (two) times daily. 30 g 0  . nitroGLYCERIN (NITROSTAT) 0.4 MG SL  tablet Place under the tongue.     No current facility-administered medications on file prior to visit.    Review of Systems  Constitutional: Negative for unusual diaphoresis or night sweats HENT: Negative for ringing in ear or discharge Eyes: Negative for double vision or worsening visual disturbance.  Respiratory: Negative for choking and stridor.   Gastrointestinal: Negative for vomiting or other signifcant bowel change Genitourinary: Negative for hematuria or change in urine volume.  Musculoskeletal: Negative for other MSK pain or swelling Skin: Negative for color change and worsening wound.  Neurological: Negative for tremors and numbness other than noted  Psychiatric/Behavioral: Negative for decreased concentration or agitation other than above       Objective:    Physical Exam BP 164/100 mmHg  Pulse 92  Temp(Src) 97.7 F (36.5 C) (Oral)  Ht 6\' 2"  (1.88 m)  Wt 266 lb (120.657 kg)  BMI 34.14 kg/m2  SpO2 95% VS noted, obese  Constitutional: Pt appears in no significant distress HENT: Head: NCAT.  Right Ear: External ear normal.  Left Ear: External ear normal.  Eyes: . Pupils are equal, round, and reactive to light. Conjunctivae and EOM are normal Neck: Normal range of motion. Neck supple.  Cardiovascular: Normal rate and regular rhythm.   Pulmonary/Chest: Effort normal and breath sounds without rales or wheezing.  Abd:  Soft, NT, ND, + BS Neurological: Pt is alert. Not confused , motor grossly intact Skin: Skin is warm. No rash, no LE edema Psychiatric: Pt behavior is normal. No agitation.     Assessment & Plan:

## 2015-11-24 NOTE — Patient Instructions (Signed)
Please take all new medication as prescribed - the BP medicine  Please continue to monitor your Blood Pressure at home every day  Please return in 2 weeks for a Nurse Visit for BP check (and bring you machine)  Please continue all other medications as before, and refills have been done if requested.  Please have the pharmacy call with any other refills you may need.  Please continue your efforts at being more active, low cholesterol diet, and weight control.  You are otherwise up to date with prevention measures today.  Please keep your appointments with your specialists as you may have planned  Please go to the LAB in the Basement (turn left off the elevator) for the tests to be done today  You will be contacted by phone if any changes need to be made immediately.  Otherwise, you will receive a letter about your results with an explanation, but please check with MyChart first.  Please remember to sign up for MyChart if you have not done so, as this will be important to you in the future with finding out test results, communicating by private email, and scheduling acute appointments online when needed.  Please return in 6 months, or sooner if needed

## 2015-11-25 NOTE — Assessment & Plan Note (Signed)
Mod elevated today, o/w stable overall by history and exam, recent data reviewed with pt, and pt to continue medical treatment as before but add hydralzine 25 tid,  to f/u any worsening symptoms or concerns BP Readings from Last 3 Encounters:  11/24/15 164/100  07/24/15 138/78  06/01/15 152/78

## 2015-11-25 NOTE — Assessment & Plan Note (Signed)
stable overall by history and exam, recent data reviewed with pt, and pt to continue medical treatment as before,  to f/u any worsening symptoms or concerns SpO2 Readings from Last 3 Encounters:  11/24/15 95%  07/24/15 98%  06/01/15 96%

## 2015-11-25 NOTE — Assessment & Plan Note (Signed)
stable overall by history and exam, recent data reviewed with pt, and pt to continue medical treatment as before,  to f/u any worsening symptoms or concerns Lab Results  Component Value Date   HGBA1C 6.2 11/24/2015

## 2015-11-25 NOTE — Assessment & Plan Note (Addendum)
stable overall by history and exam, recent data reviewed with pt, and pt to continue medical treatment as before,  to f/u any worsening symptoms or concerns Lab Results  Component Value Date   LDLCALC 90 08/09/2013   For cont low chol diet, f/u labs

## 2015-11-25 NOTE — Assessment & Plan Note (Signed)
Also for psa as he is due 

## 2015-12-01 ENCOUNTER — Ambulatory Visit: Payer: Medicare Other | Admitting: Internal Medicine

## 2015-12-04 ENCOUNTER — Encounter: Payer: Self-pay | Admitting: Gastroenterology

## 2015-12-04 ENCOUNTER — Encounter: Payer: Self-pay | Admitting: Internal Medicine

## 2015-12-07 ENCOUNTER — Telehealth: Payer: Self-pay

## 2015-12-07 ENCOUNTER — Ambulatory Visit: Payer: Medicare Other

## 2015-12-07 VITALS — BP 138/88

## 2015-12-07 DIAGNOSIS — I10 Essential (primary) hypertension: Secondary | ICD-10-CM

## 2015-12-07 NOTE — Telephone Encounter (Signed)
Patient came in nurse visit today, he was started on apresoline x2 weeks ago, bp reading today is 138/88---did you want to make any further changes to his meds?

## 2016-01-03 DIAGNOSIS — G894 Chronic pain syndrome: Secondary | ICD-10-CM | POA: Diagnosis not present

## 2016-01-03 DIAGNOSIS — Z79891 Long term (current) use of opiate analgesic: Secondary | ICD-10-CM | POA: Diagnosis not present

## 2016-01-03 DIAGNOSIS — K629 Disease of anus and rectum, unspecified: Secondary | ICD-10-CM | POA: Diagnosis not present

## 2016-02-09 DIAGNOSIS — K3189 Other diseases of stomach and duodenum: Secondary | ICD-10-CM | POA: Diagnosis not present

## 2016-02-09 DIAGNOSIS — R590 Localized enlarged lymph nodes: Secondary | ICD-10-CM | POA: Diagnosis not present

## 2016-02-09 DIAGNOSIS — Z85038 Personal history of other malignant neoplasm of large intestine: Secondary | ICD-10-CM | POA: Diagnosis not present

## 2016-02-09 DIAGNOSIS — R918 Other nonspecific abnormal finding of lung field: Secondary | ICD-10-CM | POA: Diagnosis not present

## 2016-02-09 DIAGNOSIS — Z08 Encounter for follow-up examination after completed treatment for malignant neoplasm: Secondary | ICD-10-CM | POA: Diagnosis not present

## 2016-02-29 ENCOUNTER — Telehealth: Payer: Self-pay | Admitting: Internal Medicine

## 2016-02-29 DIAGNOSIS — K629 Disease of anus and rectum, unspecified: Secondary | ICD-10-CM | POA: Diagnosis not present

## 2016-02-29 DIAGNOSIS — Z79891 Long term (current) use of opiate analgesic: Secondary | ICD-10-CM | POA: Diagnosis not present

## 2016-02-29 DIAGNOSIS — G894 Chronic pain syndrome: Secondary | ICD-10-CM | POA: Diagnosis not present

## 2016-02-29 MED ORDER — HYDRALAZINE HCL 25 MG PO TABS: 25.0000 mg | ORAL_TABLET | Freq: Three times a day (TID) | ORAL | Status: DC

## 2016-02-29 NOTE — Telephone Encounter (Signed)
Pt's wife called stating [facility] in [place] has been sending requests regarding prescription hydrALAZINE (APRESOLINE) 25 MG tablet [id number]   Insurance will only pay for 90 day supply.   Can you please send script in

## 2016-02-29 NOTE — Telephone Encounter (Signed)
New medication has been sent

## 2016-03-13 DIAGNOSIS — E785 Hyperlipidemia, unspecified: Secondary | ICD-10-CM | POA: Diagnosis not present

## 2016-03-13 DIAGNOSIS — I10 Essential (primary) hypertension: Secondary | ICD-10-CM | POA: Diagnosis not present

## 2016-03-13 DIAGNOSIS — J45909 Unspecified asthma, uncomplicated: Secondary | ICD-10-CM | POA: Diagnosis not present

## 2016-03-13 DIAGNOSIS — R933 Abnormal findings on diagnostic imaging of other parts of digestive tract: Secondary | ICD-10-CM | POA: Diagnosis not present

## 2016-03-13 DIAGNOSIS — Z85038 Personal history of other malignant neoplasm of large intestine: Secondary | ICD-10-CM | POA: Diagnosis not present

## 2016-03-13 DIAGNOSIS — K317 Polyp of stomach and duodenum: Secondary | ICD-10-CM | POA: Diagnosis not present

## 2016-03-13 DIAGNOSIS — R935 Abnormal findings on diagnostic imaging of other abdominal regions, including retroperitoneum: Secondary | ICD-10-CM | POA: Diagnosis not present

## 2016-03-13 DIAGNOSIS — Z7982 Long term (current) use of aspirin: Secondary | ICD-10-CM | POA: Diagnosis not present

## 2016-03-13 DIAGNOSIS — K219 Gastro-esophageal reflux disease without esophagitis: Secondary | ICD-10-CM | POA: Diagnosis not present

## 2016-03-13 DIAGNOSIS — Z8505 Personal history of malignant neoplasm of liver: Secondary | ICD-10-CM | POA: Diagnosis not present

## 2016-04-02 ENCOUNTER — Telehealth: Payer: Self-pay | Admitting: Internal Medicine

## 2016-04-02 NOTE — Telephone Encounter (Signed)
Can you please contact [facility] about Mr. Adamic supply order. They have been waiting a week.

## 2016-04-02 NOTE — Telephone Encounter (Signed)
LVM for [facility] to call back as soon as possible.

## 2016-04-05 NOTE — Telephone Encounter (Signed)
Spoke to tara she is doing a verbal order so that patient can have supplies

## 2016-04-25 DIAGNOSIS — Z79891 Long term (current) use of opiate analgesic: Secondary | ICD-10-CM | POA: Diagnosis not present

## 2016-04-25 DIAGNOSIS — M545 Low back pain: Secondary | ICD-10-CM | POA: Diagnosis not present

## 2016-04-25 DIAGNOSIS — K629 Disease of anus and rectum, unspecified: Secondary | ICD-10-CM | POA: Diagnosis not present

## 2016-04-25 DIAGNOSIS — G894 Chronic pain syndrome: Secondary | ICD-10-CM | POA: Diagnosis not present

## 2016-04-26 ENCOUNTER — Ambulatory Visit
Admission: RE | Admit: 2016-04-26 | Discharge: 2016-04-26 | Disposition: A | Discharge status: Home / Self Care | Payer: Medicare Other | Source: Ambulatory Visit | Attending: Anesthesiology | Admitting: Anesthesiology

## 2016-04-26 ENCOUNTER — Other Ambulatory Visit: Payer: Self-pay | Admitting: Anesthesiology

## 2016-04-26 DIAGNOSIS — M545 Low back pain, unspecified: Secondary | ICD-10-CM

## 2016-04-26 DIAGNOSIS — M47816 Spondylosis without myelopathy or radiculopathy, lumbar region: Secondary | ICD-10-CM | POA: Diagnosis not present

## 2016-05-08 ENCOUNTER — Other Ambulatory Visit: Payer: Self-pay | Admitting: Internal Medicine

## 2016-05-23 DIAGNOSIS — M545 Low back pain: Secondary | ICD-10-CM | POA: Diagnosis not present

## 2016-05-23 DIAGNOSIS — Z79891 Long term (current) use of opiate analgesic: Secondary | ICD-10-CM | POA: Diagnosis not present

## 2016-05-23 DIAGNOSIS — K629 Disease of anus and rectum, unspecified: Secondary | ICD-10-CM | POA: Diagnosis not present

## 2016-05-23 DIAGNOSIS — G894 Chronic pain syndrome: Secondary | ICD-10-CM | POA: Diagnosis not present

## 2016-05-30 ENCOUNTER — Other Ambulatory Visit: Payer: Self-pay | Admitting: Internal Medicine

## 2016-07-11 ENCOUNTER — Other Ambulatory Visit: Payer: Self-pay | Admitting: Internal Medicine

## 2016-07-18 DIAGNOSIS — Z79891 Long term (current) use of opiate analgesic: Secondary | ICD-10-CM | POA: Diagnosis not present

## 2016-07-18 DIAGNOSIS — K629 Disease of anus and rectum, unspecified: Secondary | ICD-10-CM | POA: Diagnosis not present

## 2016-07-18 DIAGNOSIS — M545 Low back pain: Secondary | ICD-10-CM | POA: Diagnosis not present

## 2016-07-18 DIAGNOSIS — G894 Chronic pain syndrome: Secondary | ICD-10-CM | POA: Diagnosis not present

## 2016-09-04 ENCOUNTER — Other Ambulatory Visit: Payer: Self-pay | Admitting: Internal Medicine

## 2016-09-13 DIAGNOSIS — Z79891 Long term (current) use of opiate analgesic: Secondary | ICD-10-CM | POA: Diagnosis not present

## 2016-09-13 DIAGNOSIS — M545 Low back pain: Secondary | ICD-10-CM | POA: Diagnosis not present

## 2016-09-13 DIAGNOSIS — G894 Chronic pain syndrome: Secondary | ICD-10-CM | POA: Diagnosis not present

## 2016-09-13 DIAGNOSIS — K629 Disease of anus and rectum, unspecified: Secondary | ICD-10-CM | POA: Diagnosis not present

## 2016-10-16 ENCOUNTER — Encounter: Payer: Self-pay | Admitting: Internal Medicine

## 2016-10-16 ENCOUNTER — Ambulatory Visit (INDEPENDENT_AMBULATORY_CARE_PROVIDER_SITE_OTHER): Payer: Medicare Other | Admitting: Internal Medicine

## 2016-10-16 VITALS — BP 140/82 | HR 71 | Resp 20 | Wt 269.0 lb

## 2016-10-16 DIAGNOSIS — I10 Essential (primary) hypertension: Secondary | ICD-10-CM

## 2016-10-16 DIAGNOSIS — E785 Hyperlipidemia, unspecified: Secondary | ICD-10-CM

## 2016-10-16 DIAGNOSIS — L989 Disorder of the skin and subcutaneous tissue, unspecified: Secondary | ICD-10-CM

## 2016-10-16 DIAGNOSIS — R7302 Impaired glucose tolerance (oral): Secondary | ICD-10-CM

## 2016-10-16 DIAGNOSIS — N320 Bladder-neck obstruction: Secondary | ICD-10-CM

## 2016-10-16 DIAGNOSIS — Z1159 Encounter for screening for other viral diseases: Secondary | ICD-10-CM

## 2016-10-16 NOTE — Patient Instructions (Signed)
Please continue all other medications as before, and refills have been done if requested.  Please have the pharmacy call with any other refills you may need.  Please continue your efforts at being more active, low cholesterol diet, and weight control.  You are otherwise up to date with prevention measures today.  Please keep your appointments with your specialists as you may have planned  You will be contacted regarding the referral for: dermatology ([facility] [place])  Please go to the LAB in the Basement (turn left off the elevator) for the tests to be done tomorrow or as you can  You will be contacted by phone if any changes need to be made immediately.  Otherwise, you will receive a letter about your results with an explanation, but please check with MyChart first.  Please remember to sign up for MyChart if you have not done so, as this will be important to you in the future with finding out test results, communicating by private email, and scheduling acute appointments online when needed.  Please return in 6 months, or sooner if needed

## 2016-10-16 NOTE — Progress Notes (Signed)
Pre visit review using our clinic review tool, if applicable. No additional management support is needed unless otherwise documented below in the visit note. 

## 2016-10-16 NOTE — Progress Notes (Signed)
Subjective:    Patient ID: Ryan Davila, male    DOB: 03/02/1951, 65 y.o.   MRN: [id number]  HPI  Here to f/u; overall doing ok,  Pt denies chest pain, increasing sob or doe, wheezing, orthopnea, PND, increased LE swelling, palpitations, dizziness or syncope.  Pt denies new neurological symptoms such as new headache, or facial or extremity weakness or numbness.  Pt denies polydipsia, polyuria, or low sugar episode.   Pt denies new neurological symptoms such as new headache, or facial or extremity weakness or numbness.   Pt states overall good compliance with meds, mostly trying to follow appropriate diet, with wt overall stable,  but little exercise however. No other change to basic hx except has new 1 mo rapidly progressing lesion to left elbow now raised with a sort of spot in the middle, no hx of skin ca prior. Past Medical History:  Diagnosis Date  . ALLERGIC RHINITIS 04/18/2008  . Allergy   . Blood transfusion without reported diagnosis    as infant  . Cancer [facility])    colon cancer  . COLON CANCER, HX OF 04/18/2008  . DEPRESSION 04/18/2008  . GERD 04/18/2008  . HEPATITIS B, HX OF 04/18/2008  . HYPERLIPIDEMIA 04/18/2008  . HYPERTENSION 04/18/2008  . Impaired glucose tolerance 04/23/2011   Past Surgical History:  Procedure Laterality Date  . APPENDECTOMY    . COLONOSCOPY    . COLOSTOMY    . 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 POLYPECTOMY    . RECTAL SURGERY  2003   AP resection  . REPAIR FLEXOR TENDON HAND    . Rotator cuff surgery    . TONSILLECTOMY      reports that he has quit smoking. He has quit using smokeless tobacco. He reports that he drinks alcohol. He reports that he does not use drugs. family history includes Breast cancer in his maternal aunt; Colon cancer in his maternal grandfather; Diabetes in his father and sister; Prostate cancer in his maternal uncle. No Known Allergies  Review of Systems  Constitutional: Negative for unusual diaphoresis  or night sweats HENT: Negative for ear swelling or discharge Eyes: Negative for worsening visual haziness  Respiratory: Negative for choking and stridor.   Gastrointestinal: Negative for distension or worsening eructation Genitourinary: Negative for retention or change in urine volume.  Musculoskeletal: Negative for other MSK pain or swelling Skin: Negative for color change and worsening wound Neurological: Negative for tremors and numbness other than noted  Psychiatric/Behavioral: Negative for decreased concentration or agitation other than above   All other system neg per pt    Objective:   Physical Exam BP 140/82   Pulse 71   Resp 20   Wt 269 lb (122 kg)   SpO2 98%   BMI 34.54 kg/m  VS noted,  Constitutional: Pt appears in no apparent distress HENT: Head: NCAT.  Right Ear: External ear normal.  Left Ear: External ear normal.  Eyes: . Pupils are equal, round, and reactive to light. Conjunctivae and EOM are normal Neck: Normal range of motion. Neck supple.  Cardiovascular: Normal rate and regular rhythm.   Pulmonary/Chest: Effort normal and breath sounds without rales or wheezing.  Neurological: Pt is alert. Not confused , motor grossly intact Skin: Skin is warm. No rash, no LE edema, left elbow with at least 10 mm raised fleshy lesion with probable rolled edges and central ulceration Psychiatric: Pt behavior is normal. No agitation.  Assessment & Plan:

## 2016-10-17 ENCOUNTER — Other Ambulatory Visit (INDEPENDENT_AMBULATORY_CARE_PROVIDER_SITE_OTHER): Payer: Medicare Other

## 2016-10-17 DIAGNOSIS — R7302 Impaired glucose tolerance (oral): Secondary | ICD-10-CM

## 2016-10-17 DIAGNOSIS — N320 Bladder-neck obstruction: Secondary | ICD-10-CM | POA: Diagnosis not present

## 2016-10-17 DIAGNOSIS — Z1159 Encounter for screening for other viral diseases: Secondary | ICD-10-CM | POA: Diagnosis not present

## 2016-10-17 DIAGNOSIS — E785 Hyperlipidemia, unspecified: Secondary | ICD-10-CM | POA: Diagnosis not present

## 2016-10-17 LAB — URINALYSIS, ROUTINE W REFLEX MICROSCOPIC
Hgb urine dipstick: NEGATIVE
Ketones, ur: NEGATIVE
LEUKOCYTES UA: NEGATIVE
Nitrite: NEGATIVE
PH: 6 (ref 5.0–8.0)
RBC / HPF: NONE SEEN (ref 0–?)
SPECIFIC GRAVITY, URINE: 1.025 (ref 1.000–1.030)
UROBILINOGEN UA: 0.2 (ref 0.0–1.0)
Urine Glucose: NEGATIVE

## 2016-10-17 LAB — CBC WITH DIFFERENTIAL/PLATELET
BASOS PCT: 0.4 % (ref 0.0–3.0)
Basophils Absolute: 0 10*3/uL (ref 0.0–0.1)
EOS ABS: 0.2 10*3/uL (ref 0.0–0.7)
Eosinophils Relative: 2.8 % (ref 0.0–5.0)
HCT: 44.3 % (ref 39.0–52.0)
HEMOGLOBIN: 15.1 g/dL (ref 13.0–17.0)
LYMPHS ABS: 1.9 10*3/uL (ref 0.7–4.0)
Lymphocytes Relative: 24.5 % (ref 12.0–46.0)
MCHC: 34.2 g/dL (ref 30.0–36.0)
MCV: 89.8 fl (ref 78.0–100.0)
MONO ABS: 0.5 10*3/uL (ref 0.1–1.0)
Monocytes Relative: 6.5 % (ref 3.0–12.0)
NEUTROS PCT: 65.8 % (ref 43.0–77.0)
Neutro Abs: 5 10*3/uL (ref 1.4–7.7)
PLATELETS: 228 10*3/uL (ref 150.0–400.0)
RBC: 4.93 Mil/uL (ref 4.22–5.81)
RDW: 12.9 % (ref 11.5–15.5)
WBC: 7.6 10*3/uL (ref 4.0–10.5)

## 2016-10-17 LAB — BASIC METABOLIC PANEL
BUN: 12 mg/dL (ref 6–23)
CHLORIDE: 103 meq/L (ref 96–112)
CO2: 31 mEq/L (ref 19–32)
Calcium: 9.4 mg/dL (ref 8.4–10.5)
Creatinine, Ser: 0.9 mg/dL (ref 0.40–1.50)
GFR: 90.29 mL/min (ref >60.00)
GLUCOSE: 197 mg/dL — AB (ref 70–99)
POTASSIUM: 4.2 meq/L (ref 3.5–5.1)
SODIUM: 140 meq/L (ref 135–145)

## 2016-10-17 LAB — LIPID PANEL
CHOLESTEROL: 163 mg/dL (ref 0–200)
HDL: 39.3 mg/dL (ref >39.00)
NonHDL: 123.25
Total CHOL/HDL Ratio: 4
Triglycerides: 243 mg/dL — ABNORMAL HIGH (ref 0.0–149.0)
VLDL: 48.6 mg/dL — ABNORMAL HIGH (ref 0.0–40.0)

## 2016-10-17 LAB — HEPATIC FUNCTION PANEL
ALBUMIN: 4.1 g/dL (ref 3.5–5.2)
ALK PHOS: 56 U/L (ref 39–117)
ALT: 32 U/L (ref 0–53)
AST: 27 U/L (ref 0–37)
BILIRUBIN TOTAL: 0.6 mg/dL (ref 0.2–1.2)
Bilirubin, Direct: 0.1 mg/dL (ref 0.0–0.3)
Total Protein: 6.7 g/dL (ref 6.0–8.3)

## 2016-10-17 LAB — HEMOGLOBIN A1C: HEMOGLOBIN A1C: 6.1 % (ref 4.6–6.5)

## 2016-10-17 LAB — PSA: PSA: 0.28 ng/mL (ref 0.10–4.00)

## 2016-10-17 LAB — LDL CHOLESTEROL, DIRECT: LDL DIRECT: 91 mg/dL

## 2016-10-17 LAB — TSH: TSH: 1.48 u[IU]/mL (ref 0.35–4.50)

## 2016-10-18 LAB — HEPATITIS C ANTIBODY: HCV AB: NEGATIVE

## 2016-10-18 NOTE — Assessment & Plan Note (Signed)
stable overall by history and exam, recent data reviewed with pt, and pt to continue medical treatment as before,  to f/u any worsening symptoms or concerns BP Readings from Last 3 Encounters:  10/16/16 140/82  12/07/15 138/88  11/24/15 (!) 164/100

## 2016-10-18 NOTE — Assessment & Plan Note (Signed)
With suspicion for basal cell ca, for referral to [facility] [place]

## 2016-10-18 NOTE — Assessment & Plan Note (Signed)
stable overall by history and exam, recent data reviewed with pt, and pt to continue medical treatment as before,  to f/u any worsening symptoms or concerns Lab Results  Component Value Date   LDLCALC 90 08/09/2013   

## 2016-10-18 NOTE — Assessment & Plan Note (Signed)
stable overall by history and exam, recent data reviewed with pt, and pt to continue medical treatment as before,  to f/u any worsening symptoms or concerns Lab Results  Component Value Date   HGBA1C 6.1 10/17/2016

## 2016-10-18 NOTE — Assessment & Plan Note (Signed)
Also for psa as he is due, asympt,  to f/u any worsening symptoms or concerns 

## 2016-10-22 ENCOUNTER — Telehealth: Payer: Self-pay | Admitting: Emergency Medicine

## 2016-10-22 NOTE — Telephone Encounter (Signed)
Pts wife called and stated the patient needs his ostomy supplies. [employer] has faxed a form over for this. Please advise thanks.

## 2016-11-02 ENCOUNTER — Other Ambulatory Visit: Payer: Self-pay | Admitting: Internal Medicine

## 2016-11-11 DIAGNOSIS — M545 Low back pain: Secondary | ICD-10-CM | POA: Diagnosis not present

## 2016-11-11 DIAGNOSIS — G894 Chronic pain syndrome: Secondary | ICD-10-CM | POA: Diagnosis not present

## 2016-11-11 DIAGNOSIS — Z79891 Long term (current) use of opiate analgesic: Secondary | ICD-10-CM | POA: Diagnosis not present

## 2016-11-11 DIAGNOSIS — K629 Disease of anus and rectum, unspecified: Secondary | ICD-10-CM | POA: Diagnosis not present

## 2016-11-13 DIAGNOSIS — L820 Inflamed seborrheic keratosis: Secondary | ICD-10-CM | POA: Diagnosis not present

## 2016-11-13 DIAGNOSIS — D0462 Carcinoma in situ of skin of left upper limb, including shoulder: Secondary | ICD-10-CM | POA: Diagnosis not present

## 2016-11-13 DIAGNOSIS — L400 Psoriasis vulgaris: Secondary | ICD-10-CM | POA: Diagnosis not present

## 2016-11-16 ENCOUNTER — Other Ambulatory Visit: Payer: Self-pay | Admitting: Internal Medicine

## 2016-11-20 ENCOUNTER — Other Ambulatory Visit: Payer: Self-pay | Admitting: Internal Medicine

## 2016-11-21 DIAGNOSIS — H2513 Age-related nuclear cataract, bilateral: Secondary | ICD-10-CM | POA: Diagnosis not present

## 2016-11-21 DIAGNOSIS — H401131 Primary open-angle glaucoma, bilateral, mild stage: Secondary | ICD-10-CM | POA: Diagnosis not present

## 2017-01-08 DIAGNOSIS — K629 Disease of anus and rectum, unspecified: Secondary | ICD-10-CM | POA: Diagnosis not present

## 2017-01-08 DIAGNOSIS — M545 Low back pain: Secondary | ICD-10-CM | POA: Diagnosis not present

## 2017-01-08 DIAGNOSIS — G894 Chronic pain syndrome: Secondary | ICD-10-CM | POA: Diagnosis not present

## 2017-01-08 DIAGNOSIS — Z79891 Long term (current) use of opiate analgesic: Secondary | ICD-10-CM | POA: Diagnosis not present

## 2017-01-30 ENCOUNTER — Other Ambulatory Visit: Payer: Self-pay | Admitting: Internal Medicine

## 2017-02-07 DIAGNOSIS — Z85038 Personal history of other malignant neoplasm of large intestine: Secondary | ICD-10-CM | POA: Diagnosis not present

## 2017-02-07 DIAGNOSIS — C787 Secondary malignant neoplasm of liver and intrahepatic bile duct: Secondary | ICD-10-CM | POA: Diagnosis not present

## 2017-02-07 DIAGNOSIS — Z08 Encounter for follow-up examination after completed treatment for malignant neoplasm: Secondary | ICD-10-CM | POA: Diagnosis not present

## 2017-02-07 DIAGNOSIS — K317 Polyp of stomach and duodenum: Secondary | ICD-10-CM | POA: Diagnosis not present

## 2017-02-07 DIAGNOSIS — R590 Localized enlarged lymph nodes: Secondary | ICD-10-CM | POA: Diagnosis not present

## 2017-03-05 DIAGNOSIS — G894 Chronic pain syndrome: Secondary | ICD-10-CM | POA: Diagnosis not present

## 2017-03-05 DIAGNOSIS — K629 Disease of anus and rectum, unspecified: Secondary | ICD-10-CM | POA: Diagnosis not present

## 2017-03-05 DIAGNOSIS — M545 Low back pain: Secondary | ICD-10-CM | POA: Diagnosis not present

## 2017-03-05 DIAGNOSIS — Z79891 Long term (current) use of opiate analgesic: Secondary | ICD-10-CM | POA: Diagnosis not present

## 2017-03-06 ENCOUNTER — Other Ambulatory Visit: Payer: Self-pay | Admitting: Internal Medicine

## 2017-04-11 ENCOUNTER — Other Ambulatory Visit: Payer: Self-pay | Admitting: Internal Medicine

## 2017-04-26 ENCOUNTER — Other Ambulatory Visit: Payer: Self-pay | Admitting: Internal Medicine

## 2017-04-30 DIAGNOSIS — G894 Chronic pain syndrome: Secondary | ICD-10-CM | POA: Diagnosis not present

## 2017-04-30 DIAGNOSIS — M545 Low back pain: Secondary | ICD-10-CM | POA: Diagnosis not present

## 2017-04-30 DIAGNOSIS — K629 Disease of anus and rectum, unspecified: Secondary | ICD-10-CM | POA: Diagnosis not present

## 2017-04-30 DIAGNOSIS — Z79891 Long term (current) use of opiate analgesic: Secondary | ICD-10-CM | POA: Diagnosis not present

## 2017-05-01 DIAGNOSIS — H401131 Primary open-angle glaucoma, bilateral, mild stage: Secondary | ICD-10-CM | POA: Diagnosis not present

## 2017-05-04 ENCOUNTER — Other Ambulatory Visit: Payer: Self-pay | Admitting: Internal Medicine

## 2017-06-03 ENCOUNTER — Other Ambulatory Visit (INDEPENDENT_AMBULATORY_CARE_PROVIDER_SITE_OTHER): Payer: Medicare Other

## 2017-06-03 ENCOUNTER — Ambulatory Visit (INDEPENDENT_AMBULATORY_CARE_PROVIDER_SITE_OTHER): Payer: Medicare Other | Admitting: Internal Medicine

## 2017-06-03 ENCOUNTER — Encounter: Payer: Self-pay | Admitting: Internal Medicine

## 2017-06-03 VITALS — BP 132/64 | HR 64 | Temp 98.4°F | Ht 74.0 in | Wt 261.0 lb

## 2017-06-03 DIAGNOSIS — E785 Hyperlipidemia, unspecified: Secondary | ICD-10-CM | POA: Diagnosis not present

## 2017-06-03 DIAGNOSIS — Z114 Encounter for screening for human immunodeficiency virus [HIV]: Secondary | ICD-10-CM

## 2017-06-03 DIAGNOSIS — M7989 Other specified soft tissue disorders: Secondary | ICD-10-CM | POA: Diagnosis not present

## 2017-06-03 DIAGNOSIS — R7302 Impaired glucose tolerance (oral): Secondary | ICD-10-CM

## 2017-06-03 DIAGNOSIS — L239 Allergic contact dermatitis, unspecified cause: Secondary | ICD-10-CM | POA: Diagnosis not present

## 2017-06-03 DIAGNOSIS — R7989 Other specified abnormal findings of blood chemistry: Secondary | ICD-10-CM | POA: Diagnosis not present

## 2017-06-03 DIAGNOSIS — I10 Essential (primary) hypertension: Secondary | ICD-10-CM | POA: Diagnosis not present

## 2017-06-03 DIAGNOSIS — N320 Bladder-neck obstruction: Secondary | ICD-10-CM

## 2017-06-03 DIAGNOSIS — L259 Unspecified contact dermatitis, unspecified cause: Secondary | ICD-10-CM | POA: Insufficient documentation

## 2017-06-03 LAB — CBC WITH DIFFERENTIAL/PLATELET
BASOS ABS: 0 10*3/uL (ref 0.0–0.1)
Basophils Relative: 0.6 % (ref 0.0–3.0)
EOS PCT: 2.8 % (ref 0.0–5.0)
Eosinophils Absolute: 0.2 10*3/uL (ref 0.0–0.7)
HEMATOCRIT: 41.9 % (ref 39.0–52.0)
Hemoglobin: 14.3 g/dL (ref 13.0–17.0)
LYMPHS ABS: 1.8 10*3/uL (ref 0.7–4.0)
LYMPHS PCT: 21.3 % (ref 12.0–46.0)
MCHC: 34 g/dL (ref 30.0–36.0)
MCV: 89.6 fl (ref 78.0–100.0)
Monocytes Absolute: 0.7 10*3/uL (ref 0.1–1.0)
Monocytes Relative: 8.2 % (ref 3.0–12.0)
NEUTROS ABS: 5.7 10*3/uL (ref 1.4–7.7)
NEUTROS PCT: 67.1 % (ref 43.0–77.0)
Platelets: 206 10*3/uL (ref 150.0–400.0)
RBC: 4.67 Mil/uL (ref 4.22–5.81)
RDW: 12.8 % (ref 11.5–15.5)
WBC: 8.5 10*3/uL (ref 4.0–10.5)

## 2017-06-03 LAB — HEMOGLOBIN A1C: Hgb A1c MFr Bld: 5.8 % (ref 4.6–6.5)

## 2017-06-03 MED ORDER — ALBUTEROL SULFATE HFA 108 (90 BASE) MCG/ACT IN AERS: 2.0000 | INHALATION_SPRAY | Freq: Four times a day (QID) | RESPIRATORY_TRACT | 11 refills | Status: DC | PRN

## 2017-06-03 MED ORDER — CARVEDILOL 25 MG PO TABS: 25.0000 mg | ORAL_TABLET | Freq: Two times a day (BID) | ORAL | 3 refills | Status: DC

## 2017-06-03 MED ORDER — MIRTAZAPINE 15 MG PO TABS: 15.0000 mg | ORAL_TABLET | Freq: Every day | ORAL | 3 refills | Status: DC

## 2017-06-03 MED ORDER — NITROGLYCERIN 0.4 MG SL SUBL: 0.4000 mg | SUBLINGUAL_TABLET | SUBLINGUAL | 5 refills | Status: AC | PRN

## 2017-06-03 MED ORDER — TRIAMCINOLONE ACETONIDE 0.1 % EX CREA: TOPICAL_CREAM | Freq: Two times a day (BID) | CUTANEOUS | 0 refills | Status: DC

## 2017-06-03 MED ORDER — AMLODIPINE-OLMESARTAN 5-40 MG PO TABS: 1.0000 | ORAL_TABLET | Freq: Every day | ORAL | 3 refills | Status: DC

## 2017-06-03 MED ORDER — HYDRALAZINE HCL 25 MG PO TABS: 25.0000 mg | ORAL_TABLET | Freq: Three times a day (TID) | ORAL | 3 refills | Status: DC

## 2017-06-03 MED ORDER — LOVASTATIN 40 MG PO TABS: 40.0000 mg | ORAL_TABLET | Freq: Every day | ORAL | 3 refills | Status: DC

## 2017-06-03 MED ORDER — TAMSULOSIN HCL 0.4 MG PO CAPS: ORAL_CAPSULE | ORAL | 3 refills | Status: DC

## 2017-06-03 NOTE — Assessment & Plan Note (Addendum)
Post trauma, but canot r/o dvt - for LLE venous doppler asap (pt declines tonight, but for exam in AM)  Note:  Total time for pt hx, exam, review of record with pt in the room, determination of diagnoses and plan for further eval and tx is > 40 min, with over 50% spent in coordination and counseling of patient including differential dx , eval and tx of LLE swelling and pain

## 2017-06-03 NOTE — Assessment & Plan Note (Signed)
Asympt, for psa as he is due 

## 2017-06-03 NOTE — Assessment & Plan Note (Signed)
Asympt, stable overall by history and exam, recent data reviewed with pt, and pt to continue medical treatment as before,  to f/u any worsening symptoms or concerns 

## 2017-06-03 NOTE — Progress Notes (Signed)
Subjective:    Patient ID: Ryan Davila, male    DOB: December 28, 1950, 66 y.o.   MRN: [id number]  HPI  Here for yearly f/u;  Overall doing ok;  Pt denies Chest pain, worsening SOB, DOE, wheezing, orthopnea, PND, palpitations, dizziness or syncope.  Pt denies neurological change such as new headache, facial or extremity weakness.  Pt denies polydipsia, polyuria, or low sugar symptoms. Pt states overall good compliance with treatment and medications, good tolerability, and has been trying to follow appropriate diet.  Pt denies worsening depressive symptoms, suicidal ideation or panic. No fever, night sweats, wt loss, loss of appetite, or other constitutional symptoms.  Pt states good ability with ADL's, has low fall risk, home safety reviewed and adequate, no other significant changes in hearing or vision, and active with exercise occasionally.  Did have an accident 3 days ago with mowing grass and machine turned over on his leg.  Since then has diffuse leg painand swelling, with some 'looseness" of the left knee but no giveaways or falls.  Also sustained some poison ivy exposure with typical rash to right arm Past Medical History:  Diagnosis Date  . ALLERGIC RHINITIS 04/18/2008  . Allergy   . Blood transfusion without reported diagnosis    as infant  . Cancer [facility])    colon cancer  . COLON CANCER, HX OF 04/18/2008  . DEPRESSION 04/18/2008  . GERD 04/18/2008  . HEPATITIS B, HX OF 04/18/2008  . HYPERLIPIDEMIA 04/18/2008  . HYPERTENSION 04/18/2008  . Impaired glucose tolerance 04/23/2011   Past Surgical History:  Procedure Laterality Date  . APPENDECTOMY    . COLONOSCOPY    . COLOSTOMY    . 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 POLYPECTOMY    . RECTAL SURGERY  2003   AP resection  . REPAIR FLEXOR TENDON HAND    . Rotator cuff surgery    . TONSILLECTOMY      reports that he has quit smoking. He has quit using smokeless tobacco. He reports that he drinks alcohol. He reports  that he does not use drugs. family history includes Breast cancer in his maternal aunt; Colon cancer in his maternal grandfather; Diabetes in his father and sister; Prostate cancer in his maternal uncle. No Known Allergies Current Outpatient Prescriptions on File Prior to Visit  Medication Sig Dispense Refill  . aspirin EC 81 MG tablet Take by mouth daily.     [facility] HYDROcodone-acetaminophen (VICODIN) 5-500 MG per tablet Take 1 tablet by mouth every 8 (eight) hours as needed.     . latanoprost (XALATAN) 0.005 % ophthalmic solution Place 1 drop into both eyes at bedtime. 0.005 %    . methadone (DOLOPHINE) 5 MG tablet Take 5 mg by mouth daily. 5mg  taken 5 times a day. Total 25mg  per day.     No current facility-administered medications on file prior to visit.    Review of Systems Constitutional: Negative for other unusual diaphoresis, sweats, appetite or weight changes HENT: Negative for other worsening hearing loss, ear pain, facial swelling, mouth sores or neck stiffness.   Eyes: Negative for other worsening pain, redness or other visual disturbance.  Respiratory: Negative for other stridor or swelling Cardiovascular: Negative for other palpitations or other chest pain  Gastrointestinal: Negative for worsening diarrhea or loose stools, blood in stool, distention or other pain Genitourinary: Negative for hematuria, flank pain or other change in urine volume.  Musculoskeletal: Negative for myalgias or  other joint swelling.  Skin: Negative for other color change, or other wound or worsening drainage.  Neurological: Negative for other syncope or numbness. Hematological: Negative for other adenopathy or swelling Psychiatric/Behavioral: Negative for hallucinations, other worsening agitation, SI, self-injury, or new decreased concentration All other system neg    Objective:   Physical Exam BP 132/64 (BP Location: Left Arm, Patient Position: Sitting, Cuff Size: Large)   Pulse 64   Temp 98.4 F  (36.9 C) (Oral)   Ht 6\' 2"  (1.88 m)   Wt 261 lb (118.4 kg)   SpO2 98%   BMI 33.51 kg/m  VS noted,  Constitutional: Pt is oriented to person, place, and time. Appears well-developed and well-nourished, in no significant distress and comfortable Head: Normocephalic and atraumatic  Eyes: Conjunctivae and EOM are normal. Pupils are equal, round, and reactive to light Right Ear: External ear normal without discharge Left Ear: External ear normal without discharge Nose: Nose without discharge or deformity Mouth/Throat: Oropharynx is without other ulcerations and moist  Neck: Normal range of motion. Neck supple. No JVD present. No tracheal deviation present or significant neck LA or mass Cardiovascular: Normal rate, regular rhythm, normal heart sounds and intact distal pulses.   Pulmonary/Chest: WOB normal and breath sounds without rales or wheezing  Abdominal: Soft. Bowel sounds are normal. NT. No HSM  Musculoskeletal: Normal range of motion. Exhibits trace RLE swelling, but 1-2+ diffuse LLE with mild tender left calf and knee post trauma, but no overt brusing or bleeding Lymphadenopathy: Has no other cervical adenopathy.  Neurological: Pt is alert and oriented to person, place, and time. Pt has normal reflexes. No cranial nerve deficit. Motor grossly intact, Gait intact Skin: Skin is warm and dry. + typical contact dermrelated rash noted to distal right arm just prox to the wrist, no new ulcerations Psychiatric:  Has normal mood and affect. Behavior is normal without agitation No other exam findings Lab Results  Component Value Date   WBC 7.6 10/17/2016   HGB 15.1 10/17/2016   HCT 44.3 10/17/2016   PLT 228.0 10/17/2016   GLUCOSE 197 (H) 10/17/2016   CHOL 163 10/17/2016   TRIG 243.0 (H) 10/17/2016   HDL 39.30 10/17/2016   LDLDIRECT 91.0 10/17/2016   LDLCALC 90 08/09/2013   ALT 32 10/17/2016   AST 27 10/17/2016   NA 140 10/17/2016   K 4.2 10/17/2016   CL 103 10/17/2016   CREATININE  0.90 10/17/2016   BUN 12 10/17/2016   CO2 31 10/17/2016   TSH 1.48 10/17/2016   PSA 0.28 10/17/2016   INR 0.84 02/22/2010   HGBA1C 6.1 10/17/2016       Assessment & Plan:

## 2017-06-03 NOTE — Patient Instructions (Signed)
Please take all new medication as prescribed - the mild steroid cream for the rash if needed  Please continue all other medications as before, and refills have been done if requested.  Please have the pharmacy call with any other refills you may need.  Please continue your efforts at being more active, low cholesterol diet, and weight control.  You are otherwise up to date with prevention measures today.  Please keep your appointments with your specialists as you may have planned  You will be contacted regarding the referral for: left leg doppler ultrasound to make sure no blood clot (asap)  Please go to the LAB in the Basement (turn left off the elevator) for the tests to be done today  You will be contacted by phone if any changes need to be made immediately.  Otherwise, you will receive a letter about your results with an explanation, but please check with MyChart first.  Please remember to sign up for MyChart if you have not done so, as this will be important to you in the future with finding out test results, communicating by private email, and scheduling acute appointments online when needed.  Please return in 1 year for your yearly visit, or sooner if needed

## 2017-06-03 NOTE — Assessment & Plan Note (Signed)
Mild, for triam cr prn

## 2017-06-03 NOTE — Assessment & Plan Note (Signed)
stable overall by history and exam, recent data reviewed with pt, and pt to continue medical treatment as before,  to f/u any worsening symptoms or concerns BP Readings from Last 3 Encounters:  06/03/17 132/64  10/16/16 140/82  12/07/15 138/88

## 2017-06-03 NOTE — Assessment & Plan Note (Signed)
stable overall by history and exam, recent data reviewed with pt, and pt to continue medical treatment as before,  to f/u any worsening symptoms or concerns Lab Results  Component Value Date   LDLCALC 90 08/09/2013   

## 2017-06-04 LAB — HIV ANTIBODY (ROUTINE TESTING W REFLEX): HIV 1&2 Ab, 4th Generation: NONREACTIVE

## 2017-06-04 LAB — HEPATIC FUNCTION PANEL
ALBUMIN: 4.1 g/dL (ref 3.5–5.2)
ALK PHOS: 51 U/L (ref 39–117)
ALT: 15 U/L (ref 0–53)
AST: 15 U/L (ref 0–37)
BILIRUBIN DIRECT: 0.1 mg/dL (ref 0.0–0.3)
TOTAL PROTEIN: 6.4 g/dL (ref 6.0–8.3)
Total Bilirubin: 0.6 mg/dL (ref 0.2–1.2)

## 2017-06-04 LAB — LIPID PANEL
CHOL/HDL RATIO: 5
Cholesterol: 165 mg/dL (ref 0–200)
HDL: 33.9 mg/dL — AB (ref >39.00)
NONHDL: 130.66
TRIGLYCERIDES: 294 mg/dL — AB (ref 0.0–149.0)
VLDL: 58.8 mg/dL — ABNORMAL HIGH (ref 0.0–40.0)

## 2017-06-04 LAB — URINALYSIS, ROUTINE W REFLEX MICROSCOPIC
BILIRUBIN URINE: NEGATIVE
HGB URINE DIPSTICK: NEGATIVE
KETONES UR: NEGATIVE
LEUKOCYTES UA: NEGATIVE
Nitrite: NEGATIVE
RBC / HPF: NONE SEEN (ref 0–?)
Specific Gravity, Urine: >=1.03 — AB (ref 1.000–1.030)
Total Protein, Urine: NEGATIVE
UROBILINOGEN UA: 0.2 (ref 0.0–1.0)
Urine Glucose: NEGATIVE
WBC UA: NONE SEEN (ref 0–?)
pH: 6 (ref 5.0–8.0)

## 2017-06-04 LAB — PSA: PSA: 0.21 ng/mL (ref 0.10–4.00)

## 2017-06-04 LAB — BASIC METABOLIC PANEL
BUN: 12 mg/dL (ref 6–23)
CALCIUM: 9.2 mg/dL (ref 8.4–10.5)
CO2: 29 mEq/L (ref 19–32)
CREATININE: 0.99 mg/dL (ref 0.40–1.50)
Chloride: 105 mEq/L (ref 96–112)
GFR: 80.73 mL/min (ref >60.00)
Glucose, Bld: 110 mg/dL — ABNORMAL HIGH (ref 70–99)
Potassium: 4 mEq/L (ref 3.5–5.1)
SODIUM: 139 meq/L (ref 135–145)

## 2017-06-04 LAB — LDL CHOLESTEROL, DIRECT: LDL DIRECT: 80 mg/dL

## 2017-06-04 LAB — TSH: TSH: 1.34 u[IU]/mL (ref 0.35–4.50)

## 2017-06-06 ENCOUNTER — Ambulatory Visit (HOSPITAL_COMMUNITY)
Admission: RE | Admit: 2017-06-06 | Discharge: 2017-06-06 | Disposition: A | Discharge status: Home / Self Care | Payer: Medicare Other | Source: Ambulatory Visit | Attending: Internal Medicine | Admitting: Internal Medicine

## 2017-06-06 DIAGNOSIS — I10 Essential (primary) hypertension: Secondary | ICD-10-CM | POA: Insufficient documentation

## 2017-06-06 DIAGNOSIS — Z87891 Personal history of nicotine dependence: Secondary | ICD-10-CM | POA: Diagnosis not present

## 2017-06-06 DIAGNOSIS — E785 Hyperlipidemia, unspecified: Secondary | ICD-10-CM | POA: Diagnosis not present

## 2017-06-06 DIAGNOSIS — M7989 Other specified soft tissue disorders: Secondary | ICD-10-CM

## 2017-06-13 ENCOUNTER — Encounter: Payer: Self-pay | Admitting: Internal Medicine

## 2017-06-19 ENCOUNTER — Ambulatory Visit (INDEPENDENT_AMBULATORY_CARE_PROVIDER_SITE_OTHER): Payer: Medicare Other | Admitting: Internal Medicine

## 2017-06-19 ENCOUNTER — Encounter: Payer: Self-pay | Admitting: Internal Medicine

## 2017-06-19 VITALS — BP 142/84 | HR 77 | Ht 74.0 in | Wt 258.0 lb

## 2017-06-19 DIAGNOSIS — L239 Allergic contact dermatitis, unspecified cause: Secondary | ICD-10-CM | POA: Insufficient documentation

## 2017-06-19 DIAGNOSIS — I10 Essential (primary) hypertension: Secondary | ICD-10-CM | POA: Diagnosis not present

## 2017-06-19 DIAGNOSIS — J309 Allergic rhinitis, unspecified: Secondary | ICD-10-CM

## 2017-06-19 DIAGNOSIS — L089 Local infection of the skin and subcutaneous tissue, unspecified: Secondary | ICD-10-CM | POA: Insufficient documentation

## 2017-06-19 DIAGNOSIS — W57XXXA Bitten or stung by nonvenomous insect and other nonvenomous arthropods, initial encounter: Secondary | ICD-10-CM | POA: Diagnosis not present

## 2017-06-19 DIAGNOSIS — S80869A Insect bite (nonvenomous), unspecified lower leg, initial encounter: Secondary | ICD-10-CM

## 2017-06-19 DIAGNOSIS — S80862A Insect bite (nonvenomous), left lower leg, initial encounter: Secondary | ICD-10-CM | POA: Diagnosis not present

## 2017-06-19 MED ORDER — DOXYCYCLINE HYCLATE 100 MG PO TABS: 100.0000 mg | ORAL_TABLET | Freq: Two times a day (BID) | ORAL | 0 refills | Status: DC

## 2017-06-19 MED ORDER — METHYLPREDNISOLONE ACETATE 80 MG/ML IJ SUSP
80.0000 mg | Freq: Once | INTRAMUSCULAR | Status: AC
  Administered 2017-06-19: 80 mg via INTRAMUSCULAR

## 2017-06-19 MED ORDER — PREDNISONE 10 MG PO TABS: ORAL_TABLET | ORAL | 0 refills | Status: DC

## 2017-06-19 NOTE — Assessment & Plan Note (Signed)
stable overall by history and exam, recent data reviewed with pt, and pt to continue medical treatment as before,  to f/u any worsening symptoms or concerns BP Readings from Last 3 Encounters:  06/19/17 (!) 142/84  06/03/17 132/64  10/16/16 140/82

## 2017-06-19 NOTE — Assessment & Plan Note (Signed)
Also to improve with tx as above,  to f/u any worsening symptoms or concerns 

## 2017-06-19 NOTE — Assessment & Plan Note (Signed)
Mild to mod, for depomedrol IM 80, predpac asd, and cont triam cr prn topical, to f/u any worsening symptoms or concerns

## 2017-06-19 NOTE — Progress Notes (Signed)
Subjective:    Patient ID: Ryan Davila, male    DOB: 10/28/1951, 66 y.o.   MRN: [id number]  HPI  Here after being seen last wk for RUE contact dermatitis to wrist and hand now improved with topical cream, now with bilat lower legs anterior aspect mult areas of itchy erythema only between knees and ankles; was mowing grass with lots of debris to the leg while wearing shorts. Pt denies chest pain, increased sob or doe, wheezing, orthopnea, PND, increased LE swelling, palpitations, dizziness or syncope.   Pt denies polydipsia, polyuria.  Does have several wks ongoing nasal allergy symptoms with clearish congestion, itch and sneezing, without fever, pain, ST, cough, swelling or wheezing.  Also with tick bite site to post left knee/prox calf with worsening swelling and tender over last 3 days Past Medical History:  Diagnosis Date  . ALLERGIC RHINITIS 04/18/2008  . Allergy   . Blood transfusion without reported diagnosis    as infant  . Cancer [facility])    colon cancer  . COLON CANCER, HX OF 04/18/2008  . DEPRESSION 04/18/2008  . GERD 04/18/2008  . HEPATITIS B, HX OF 04/18/2008  . HYPERLIPIDEMIA 04/18/2008  . HYPERTENSION 04/18/2008  . Impaired glucose tolerance 04/23/2011   Past Surgical History:  Procedure Laterality Date  . APPENDECTOMY    . COLONOSCOPY    . COLOSTOMY    . 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 POLYPECTOMY    . RECTAL SURGERY  2003   AP resection  . REPAIR FLEXOR TENDON HAND    . Rotator cuff surgery    . TONSILLECTOMY      reports that he has quit smoking. He has quit using smokeless tobacco. He reports that he drinks alcohol. He reports that he does not use drugs. family history includes Breast cancer in his maternal aunt; Colon cancer in his maternal grandfather; Diabetes in his father and sister; Prostate cancer in his maternal uncle. No Known Allergies Current Outpatient Prescriptions on File Prior to Visit  Medication Sig Dispense Refill  .  albuterol (PROAIR HFA) 108 (90 Base) MCG/ACT inhaler Inhale 2 puffs into the lungs every 6 (six) hours as needed for wheezing. 25.5 Inhaler 11  . amLODipine-olmesartan (AZOR) 5-40 MG tablet Take 1 tablet by mouth daily. 90 tablet 3  . aspirin EC 81 MG tablet Take by mouth daily.     . carvedilol (COREG) 25 MG tablet Take 1 tablet (25 mg total) by mouth 2 (two) times daily with a meal. 180 tablet 3  . hydrALAZINE (APRESOLINE) 25 MG tablet Take 1 tablet (25 mg total) by mouth 3 (three) times daily. 270 tablet 3  . HYDROcodone-acetaminophen (VICODIN) 5-500 MG per tablet Take 1 tablet by mouth every 8 (eight) hours as needed.     . latanoprost (XALATAN) 0.005 % ophthalmic solution Place 1 drop into both eyes at bedtime. 0.005 %    . lovastatin (MEVACOR) 40 MG tablet Take 1 tablet (40 mg total) by mouth daily. 90 tablet 3  . methadone (DOLOPHINE) 5 MG tablet Take 5 mg by mouth daily. 5mg  taken 5 times a day. Total 25mg  per day.    . mirtazapine (REMERON) 15 MG tablet Take 1 tablet (15 mg total) by mouth at bedtime. 90 tablet 3  . nitroGLYCERIN (NITROSTAT) 0.4 MG SL tablet Place 1 tablet (0.4 mg total) under the tongue every 5 (five) minutes as needed for chest pain. 40 tablet 5  .  tamsulosin (FLOMAX) 0.4 MG CAPS capsule TAKE 1 CAPSULE (0.4 MG TOTAL) BY MOUTH DAILY. 90 capsule 3  . triamcinolone cream (KENALOG) 0.1 % Apply topically 2 (two) times daily. 30 g 0   No current facility-administered medications on file prior to visit.    Review of Systems  Constitutional: Negative for other unusual diaphoresis or sweats HENT: Negative for ear discharge or swelling Eyes: Negative for other worsening visual disturbances Respiratory: Negative for stridor or other swelling  Gastrointestinal: Negative for worsening distension or other blood Genitourinary: Negative for retention or other urinary change Musculoskeletal: Negative for other MSK pain or swelling Skin: Negative for color change or other new  lesions Neurological: Negative for worsening tremors and other numbness  Psychiatric/Behavioral: Negative for worsening agitation or other fatigue All other system neg per pt    Objective:   Physical Exam BP (!) 142/84   Pulse 77   Ht 6\' 2"  (1.88 m)   Wt 258 lb (117 kg)   SpO2 99%   BMI 33.13 kg/m  VS noted,  Constitutional: Pt appears in NAD HENT: Head: NCAT.  Right Ear: External ear normal.  Left Ear: External ear normal.  Eyes: . Pupils are equal, round, and reactive to light. Conjunctivae and EOM are normal Nose: without d/c or deformity Neck: Neck supple. Gross normal ROM Cardiovascular: Normal rate and regular rhythm.   Pulmonary/Chest: Effort normal and breath sounds without rales or wheezing.  Neurological: Pt is alert. At baseline orientation, motor grossly intact Skin: Skin is warm. Numerous erythem small lesions nontender nonraised nonvesicular rash to the bilat pretibial legs between knees and ankles, no other new lesions, no LE edema; left post knee with 1 cm area red, tender, swelling Psychiatric: Pt behavior is normal without agitation  No other exam findings    Assessment & Plan:

## 2017-06-19 NOTE — Assessment & Plan Note (Signed)
Mild to mod, for antibx course,  to f/u any worsening symptoms or concerns 

## 2017-06-19 NOTE — Patient Instructions (Signed)
You had the steroid shot today  Please take all new medication as prescribed - the antibiotic, and the prednisone  Please continue all other medications as before, and refills have been done if requested.  Please have the pharmacy call with any other refills you may need.  Please keep your appointments with your specialists as you may have planned

## 2017-06-25 DIAGNOSIS — M545 Low back pain: Secondary | ICD-10-CM | POA: Diagnosis not present

## 2017-06-25 DIAGNOSIS — Z79891 Long term (current) use of opiate analgesic: Secondary | ICD-10-CM | POA: Diagnosis not present

## 2017-06-25 DIAGNOSIS — G894 Chronic pain syndrome: Secondary | ICD-10-CM | POA: Diagnosis not present

## 2017-06-25 DIAGNOSIS — K629 Disease of anus and rectum, unspecified: Secondary | ICD-10-CM | POA: Diagnosis not present

## 2017-07-02 ENCOUNTER — Telehealth: Payer: Self-pay | Admitting: Internal Medicine

## 2017-07-02 NOTE — Telephone Encounter (Signed)
I cannot be completely sure, but I do not recall this. thanks

## 2017-07-02 NOTE — Telephone Encounter (Signed)
Called asking if you have received an order for the Pt for an Ostomy order.  Reference number [id number]

## 2017-07-02 NOTE — Telephone Encounter (Signed)
Dr. Jenny Reichmann have you seen this paperwork?

## 2017-07-09 NOTE — Telephone Encounter (Signed)
No I haven't seen anything come across the fax line yet.

## 2017-07-09 NOTE — Telephone Encounter (Signed)
LVM with Claiborne Billings notifying

## 2017-07-09 NOTE — Telephone Encounter (Signed)
The Ostomy order was faxed yesterday have you received it   [id number]  Reference [id number]

## 2017-07-10 NOTE — Telephone Encounter (Signed)
Fax received, signed and returned via fax.   Paperwork sent to scan dept.

## 2017-07-29 ENCOUNTER — Telehealth: Payer: Self-pay | Admitting: Internal Medicine

## 2017-07-29 NOTE — Telephone Encounter (Signed)
Patient Name: NASON CONRADT  DOB: 1951-11-08    Initial Comment Caller sates her husband was stung 4-5 times by wasps. Arm is swelled and hurting.    Nurse Assessment  Nurse: Raphael Gibney, RN, Vanita Ingles Date/Time (Eastern Time): 07/29/2017 11:09:20 AM  Confirm and document reason for call. If symptomatic, describe symptoms. ---Caller states spouse was stung 4-5 times by wasp. he has glaucoma. Happened yesterday. Right arm is red and swollen from his mid forearm to the middle of his bicep  Does the patient have any new or worsening symptoms? ---Yes  Will a triage be completed? ---Yes  Related visit to physician within the last 2 weeks? ---No  Does the PT have any chronic conditions? (i.e. diabetes, asthma, etc.) ---Yes  List chronic conditions. ---HTN: glaucoma; cancer survivor  Is this a behavioral health or substance abuse call? ---No     Guidelines    Guideline Title Affirmed Question Affirmed Notes  Bee or Yellow Jacket Sting Swelling is huge (e.g., > 4 inches or 10 cm, spreads beyond wrist or ankle)    Final Disposition User   See Physician within 24 Hours Stringer, RN, [place] states pt does not want to make appt. She wants to know if pt can take benadryl for the swelling. Box says do not take if you have glaucoma. Please call pt back regarding benadryl.   Referrals  REFERRED TO PCP OFFICE   Disagree/Comply: Disagree  Disagree/Comply Reason: Disagree with instructions

## 2017-08-11 DIAGNOSIS — H401131 Primary open-angle glaucoma, bilateral, mild stage: Secondary | ICD-10-CM | POA: Diagnosis not present

## 2017-08-21 DIAGNOSIS — Z79891 Long term (current) use of opiate analgesic: Secondary | ICD-10-CM | POA: Diagnosis not present

## 2017-08-21 DIAGNOSIS — K629 Disease of anus and rectum, unspecified: Secondary | ICD-10-CM | POA: Diagnosis not present

## 2017-08-21 DIAGNOSIS — G894 Chronic pain syndrome: Secondary | ICD-10-CM | POA: Diagnosis not present

## 2017-08-21 DIAGNOSIS — M545 Low back pain: Secondary | ICD-10-CM | POA: Diagnosis not present

## 2017-10-16 DIAGNOSIS — K629 Disease of anus and rectum, unspecified: Secondary | ICD-10-CM | POA: Diagnosis not present

## 2017-10-16 DIAGNOSIS — G894 Chronic pain syndrome: Secondary | ICD-10-CM | POA: Diagnosis not present

## 2017-10-16 DIAGNOSIS — M545 Low back pain: Secondary | ICD-10-CM | POA: Diagnosis not present

## 2017-10-16 DIAGNOSIS — Z79891 Long term (current) use of opiate analgesic: Secondary | ICD-10-CM | POA: Diagnosis not present

## 2017-11-13 DIAGNOSIS — H2513 Age-related nuclear cataract, bilateral: Secondary | ICD-10-CM | POA: Diagnosis not present

## 2017-11-13 DIAGNOSIS — H524 Presbyopia: Secondary | ICD-10-CM | POA: Diagnosis not present

## 2017-11-13 DIAGNOSIS — H401131 Primary open-angle glaucoma, bilateral, mild stage: Secondary | ICD-10-CM | POA: Diagnosis not present

## 2017-12-11 DIAGNOSIS — K629 Disease of anus and rectum, unspecified: Secondary | ICD-10-CM | POA: Diagnosis not present

## 2017-12-11 DIAGNOSIS — G894 Chronic pain syndrome: Secondary | ICD-10-CM | POA: Diagnosis not present

## 2017-12-11 DIAGNOSIS — Z79891 Long term (current) use of opiate analgesic: Secondary | ICD-10-CM | POA: Diagnosis not present

## 2017-12-11 DIAGNOSIS — M545 Low back pain: Secondary | ICD-10-CM | POA: Diagnosis not present

## 2018-02-05 DIAGNOSIS — G894 Chronic pain syndrome: Secondary | ICD-10-CM | POA: Diagnosis not present

## 2018-02-05 DIAGNOSIS — M545 Low back pain: Secondary | ICD-10-CM | POA: Diagnosis not present

## 2018-02-05 DIAGNOSIS — K629 Disease of anus and rectum, unspecified: Secondary | ICD-10-CM | POA: Diagnosis not present

## 2018-02-05 DIAGNOSIS — Z79891 Long term (current) use of opiate analgesic: Secondary | ICD-10-CM | POA: Diagnosis not present

## 2018-02-19 DIAGNOSIS — H401131 Primary open-angle glaucoma, bilateral, mild stage: Secondary | ICD-10-CM | POA: Diagnosis not present

## 2018-04-02 DIAGNOSIS — K629 Disease of anus and rectum, unspecified: Secondary | ICD-10-CM | POA: Diagnosis not present

## 2018-04-02 DIAGNOSIS — M545 Low back pain: Secondary | ICD-10-CM | POA: Diagnosis not present

## 2018-04-02 DIAGNOSIS — Z79891 Long term (current) use of opiate analgesic: Secondary | ICD-10-CM | POA: Diagnosis not present

## 2018-04-02 DIAGNOSIS — G894 Chronic pain syndrome: Secondary | ICD-10-CM | POA: Diagnosis not present

## 2018-05-29 ENCOUNTER — Other Ambulatory Visit: Payer: Self-pay | Admitting: Internal Medicine

## 2018-06-01 DIAGNOSIS — M545 Low back pain: Secondary | ICD-10-CM | POA: Diagnosis not present

## 2018-06-01 DIAGNOSIS — G894 Chronic pain syndrome: Secondary | ICD-10-CM | POA: Diagnosis not present

## 2018-06-01 DIAGNOSIS — K629 Disease of anus and rectum, unspecified: Secondary | ICD-10-CM | POA: Diagnosis not present

## 2018-06-01 DIAGNOSIS — Z79891 Long term (current) use of opiate analgesic: Secondary | ICD-10-CM | POA: Diagnosis not present

## 2018-06-05 ENCOUNTER — Other Ambulatory Visit: Payer: Self-pay | Admitting: Internal Medicine

## 2018-07-10 ENCOUNTER — Ambulatory Visit: Payer: Medicare Other | Admitting: Internal Medicine

## 2018-07-11 ENCOUNTER — Other Ambulatory Visit: Payer: Self-pay | Admitting: Internal Medicine

## 2018-07-13 ENCOUNTER — Other Ambulatory Visit: Payer: Self-pay | Admitting: Internal Medicine

## 2018-07-14 ENCOUNTER — Other Ambulatory Visit: Payer: Self-pay | Admitting: Internal Medicine

## 2018-07-17 ENCOUNTER — Ambulatory Visit (INDEPENDENT_AMBULATORY_CARE_PROVIDER_SITE_OTHER): Payer: Medicare Other | Admitting: Internal Medicine

## 2018-07-17 ENCOUNTER — Encounter: Payer: Self-pay | Admitting: Internal Medicine

## 2018-07-17 ENCOUNTER — Other Ambulatory Visit (INDEPENDENT_AMBULATORY_CARE_PROVIDER_SITE_OTHER): Payer: Medicare Other

## 2018-07-17 VITALS — BP 160/80 | HR 90 | Temp 97.9°F | Ht 74.0 in | Wt 261.0 lb

## 2018-07-17 DIAGNOSIS — R7302 Impaired glucose tolerance (oral): Secondary | ICD-10-CM | POA: Diagnosis not present

## 2018-07-17 DIAGNOSIS — I10 Essential (primary) hypertension: Secondary | ICD-10-CM | POA: Diagnosis not present

## 2018-07-17 DIAGNOSIS — H6122 Impacted cerumen, left ear: Secondary | ICD-10-CM | POA: Diagnosis not present

## 2018-07-17 DIAGNOSIS — Z23 Encounter for immunization: Secondary | ICD-10-CM | POA: Diagnosis not present

## 2018-07-17 DIAGNOSIS — E785 Hyperlipidemia, unspecified: Secondary | ICD-10-CM | POA: Diagnosis not present

## 2018-07-17 DIAGNOSIS — N320 Bladder-neck obstruction: Secondary | ICD-10-CM

## 2018-07-17 DIAGNOSIS — H9192 Unspecified hearing loss, left ear: Secondary | ICD-10-CM | POA: Insufficient documentation

## 2018-07-17 LAB — CBC WITH DIFFERENTIAL/PLATELET
Basophils Absolute: 0 10*3/uL (ref 0.0–0.1)
Basophils Relative: 0.7 % (ref 0.0–3.0)
Eosinophils Absolute: 0.2 10*3/uL (ref 0.0–0.7)
Eosinophils Relative: 2.3 % (ref 0.0–5.0)
HEMATOCRIT: 44 % (ref 39.0–52.0)
HEMOGLOBIN: 15.2 g/dL (ref 13.0–17.0)
LYMPHS PCT: 27.5 % (ref 12.0–46.0)
Lymphs Abs: 2 10*3/uL (ref 0.7–4.0)
MCHC: 34.5 g/dL (ref 30.0–36.0)
MCV: 89.1 fl (ref 78.0–100.0)
Monocytes Absolute: 0.6 10*3/uL (ref 0.1–1.0)
Monocytes Relative: 7.8 % (ref 3.0–12.0)
NEUTROS ABS: 4.4 10*3/uL (ref 1.4–7.7)
Neutrophils Relative %: 61.7 % (ref 43.0–77.0)
PLATELETS: 216 10*3/uL (ref 150.0–400.0)
RBC: 4.93 Mil/uL (ref 4.22–5.81)
RDW: 13 % (ref 11.5–15.5)
WBC: 7.2 10*3/uL (ref 4.0–10.5)

## 2018-07-17 LAB — BASIC METABOLIC PANEL
BUN: 12 mg/dL (ref 6–23)
CALCIUM: 9.2 mg/dL (ref 8.4–10.5)
CO2: 31 mEq/L (ref 19–32)
CREATININE: 0.97 mg/dL (ref 0.40–1.50)
Chloride: 101 mEq/L (ref 96–112)
GFR: 82.36 mL/min (ref >60.00)
GLUCOSE: 155 mg/dL — AB (ref 70–99)
POTASSIUM: 4.4 meq/L (ref 3.5–5.1)
Sodium: 138 mEq/L (ref 135–145)

## 2018-07-17 LAB — HEPATIC FUNCTION PANEL
ALK PHOS: 57 U/L (ref 39–117)
ALT: 21 U/L (ref 0–53)
AST: 17 U/L (ref 0–37)
Albumin: 4.2 g/dL (ref 3.5–5.2)
BILIRUBIN TOTAL: 0.6 mg/dL (ref 0.2–1.2)
Bilirubin, Direct: 0.1 mg/dL (ref 0.0–0.3)
Total Protein: 6.8 g/dL (ref 6.0–8.3)

## 2018-07-17 LAB — HEMOGLOBIN A1C: HEMOGLOBIN A1C: 6.2 % (ref 4.6–6.5)

## 2018-07-17 LAB — LIPID PANEL
CHOLESTEROL: 178 mg/dL (ref 0–200)
HDL: 39.9 mg/dL (ref >39.00)
NonHDL: 137.93
Total CHOL/HDL Ratio: 4
Triglycerides: 248 mg/dL — ABNORMAL HIGH (ref 0.0–149.0)
VLDL: 49.6 mg/dL — AB (ref 0.0–40.0)

## 2018-07-17 LAB — URINALYSIS, ROUTINE W REFLEX MICROSCOPIC
HGB URINE DIPSTICK: NEGATIVE
Ketones, ur: NEGATIVE
Leukocytes, UA: NEGATIVE
NITRITE: NEGATIVE
Specific Gravity, Urine: >=1.03 — AB (ref 1.000–1.030)
TOTAL PROTEIN, URINE-UPE24: NEGATIVE
Urine Glucose: NEGATIVE
Urobilinogen, UA: 0.2 (ref 0.0–1.0)
pH: 5.5 (ref 5.0–8.0)

## 2018-07-17 LAB — PSA: PSA: 0.39 ng/mL (ref 0.10–4.00)

## 2018-07-17 LAB — LDL CHOLESTEROL, DIRECT: LDL DIRECT: 89 mg/dL

## 2018-07-17 LAB — TSH: TSH: 1.37 u[IU]/mL (ref 0.35–4.50)

## 2018-07-17 MED ORDER — ALBUTEROL SULFATE HFA 108 (90 BASE) MCG/ACT IN AERS: INHALATION_SPRAY | RESPIRATORY_TRACT | 3 refills | Status: DC

## 2018-07-17 MED ORDER — AMLODIPINE-OLMESARTAN 5-40 MG PO TABS: 1.0000 | ORAL_TABLET | Freq: Every day | ORAL | 3 refills | Status: DC

## 2018-07-17 MED ORDER — CARVEDILOL 25 MG PO TABS: 25.0000 mg | ORAL_TABLET | Freq: Two times a day (BID) | ORAL | 3 refills | Status: DC

## 2018-07-17 MED ORDER — HYDRALAZINE HCL 50 MG PO TABS: 50.0000 mg | ORAL_TABLET | Freq: Three times a day (TID) | ORAL | 3 refills | Status: DC

## 2018-07-17 NOTE — Assessment & Plan Note (Signed)
Also for psa as is due, o/w asympt

## 2018-07-17 NOTE — Assessment & Plan Note (Signed)
Uncontrolled, to increase hydralazine 50 tid, cont other same tx,  to f/u any worsening symptoms or concerns

## 2018-07-17 NOTE — Assessment & Plan Note (Signed)
stable overall by history and exam, recent data reviewed with pt, and pt to continue medical treatment as before,  to f/u any worsening symptoms or concerns Lab Results  Component Value Date   HGBA1C 6.2 07/17/2018

## 2018-07-17 NOTE — Progress Notes (Signed)
Subjective:    Patient ID: Ryan Davila, male    DOB: 05-25-1951, 67 y.o.   MRN: [id number]  HPI  Here for yearly f/u with wife;  Overall doing ok;  Pt denies Chest pain, worsening SOB, DOE, wheezing, orthopnea, PND, worsening LE edema, palpitations, dizziness or syncope.  Pt denies neurological change such as new headache, facial or extremity weakness.  Pt denies polydipsia, polyuria, or low sugar symptoms. Pt states overall good compliance with treatment and medications, good tolerability, and has been trying to follow appropriate diet.  Pt denies worsening depressive symptoms, suicidal ideation or panic. No fever, night sweats, wt loss, loss of appetite, or other constitutional symptoms.  Pt states good ability with ADL's, has low fall risk, home safety reviewed and adequate, no other significant changes in hearing or vision, and only occasionally active with exercise.  Has been out of BP meds for 2 wks. Even on BP meds his Bp at home   Pt continues to have recurring LBP without change in severity, bowel or bladder change, fever, wt loss,  worsening LE pain/numbness/weakness, gait change or falls, Strong FH of CAD with father and sister..  Had echo and stress echo fevb 2015 at [facility] neg for acute.   Also with 1 wk onset reduced hearing left ear - ? Wax again Past Medical History:  Diagnosis Date  . ALLERGIC RHINITIS 04/18/2008  . Allergy   . Blood transfusion without reported diagnosis    as infant  . Cancer [facility])    colon cancer  . COLON CANCER, HX OF 04/18/2008  . DEPRESSION 04/18/2008  . GERD 04/18/2008  . HEPATITIS B, HX OF 04/18/2008  . HYPERLIPIDEMIA 04/18/2008  . HYPERTENSION 04/18/2008  . Impaired glucose tolerance 04/23/2011   Past Surgical History:  Procedure Laterality Date  . APPENDECTOMY    . COLONOSCOPY    . COLOSTOMY    . 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 POLYPECTOMY    . RECTAL SURGERY  2003   AP resection  . REPAIR FLEXOR TENDON HAND    .  Rotator cuff surgery    . TONSILLECTOMY      reports that he has quit smoking. He has quit using smokeless tobacco. He reports that he drinks alcohol. He reports that he does not use drugs. family history includes Breast cancer in his maternal aunt; Colon cancer in his maternal grandfather; Diabetes in his father and sister; Prostate cancer in his maternal uncle. No Known Allergies Current Outpatient Medications on File Prior to Visit  Medication Sig Dispense Refill  . HYDROcodone-acetaminophen (VICODIN) 5-500 MG per tablet Take 1 tablet by mouth every 8 (eight) hours as needed.     . latanoprost (XALATAN) 0.005 % ophthalmic solution Place 1 drop into both eyes at bedtime. 0.005 %    . lovastatin (MEVACOR) 40 MG tablet TAKE 1 TABLET DAILY 90 tablet 3  . methadone (DOLOPHINE) 5 MG tablet Take 5 mg by mouth daily. 5mg  taken 5 times a day. Total 25mg  per day.    . mirtazapine (REMERON) 15 MG tablet Take 1 tablet (15 mg total) by mouth at bedtime. 90 tablet 3  . tamsulosin (FLOMAX) 0.4 MG CAPS capsule TAKE 1 CAPSULE (0.4 MG TOTAL) BY MOUTH DAILY. 90 capsule 3  . triamcinolone cream (KENALOG) 0.1 % Apply topically 2 (two) times daily. 30 g 0  . aspirin EC 81 MG tablet Take by mouth daily.     Marland Kitchen  nitroGLYCERIN (NITROSTAT) 0.4 MG SL tablet Place 1 tablet (0.4 mg total) under the tongue every 5 (five) minutes as needed for chest pain. 40 tablet 5   No current facility-administered medications on file prior to visit.    Review of Systems  Constitutional: Negative for other unusual diaphoresis or sweats HENT: Negative for ear discharge or swelling Eyes: Negative for other worsening visual disturbances Respiratory: Negative for stridor or other swelling  Gastrointestinal: Negative for worsening distension or other blood Genitourinary: Negative for retention or other urinary change Musculoskeletal: Negative for other MSK pain or swelling Skin: Negative for color change or other new  lesions Neurological: Negative for worsening tremors and other numbness  Psychiatric/Behavioral: Negative for worsening agitation or other fatigue All other system neg per pt    Objective:   Physical Exam BP (!) 160/80 (BP Location: Left Arm, Patient Position: Sitting, Cuff Size: Normal)   Pulse 90   Temp 97.9 F (36.6 C) (Oral)   Ht 6\' 2"  (1.88 m)   Wt 261 lb (118.4 kg)   SpO2 93%   BMI 33.51 kg/m  VS noted,  Constitutional: Pt is oriented to person, place, and time. Appears well-developed and well-nourished, in no significant distress and comfortable Head: Normocephalic and atraumatic  Eyes: Conjunctivae and EOM are normal. Pupils are equal, round, and reactive to light Right Ear: External ear normal without discharge Left Ear: External ear normal without discharge, left wax impaction resolved with irrigation and hearing improved Nose: Nose without discharge or deformity Mouth/Throat: Oropharynx is without other ulcerations and moist  Neck: Normal range of motion. Neck supple. No JVD present. No tracheal deviation present or significant neck LA or mass Cardiovascular: Normal rate, regular rhythm, normal heart sounds and intact distal pulses.   Pulmonary/Chest: WOB normal and breath sounds without rales or wheezing  Abdominal: Soft. Bowel sounds are normal. NT. No HSM  Musculoskeletal: Normal range of motion. Exhibits no edema Lymphadenopathy: Has no other cervical adenopathy.  Neurological: Pt is alert and oriented to person, place, and time. Pt has normal reflexes. No cranial nerve deficit. Motor grossly intact, Gait intact Skin: Skin is warm and dry. No rash noted or new ulcerations Psychiatric:  Has normal mood and affect. Behavior is normal without agitation No other exam findings Lab Results  Component Value Date   WBC 7.2 07/17/2018   HGB 15.2 07/17/2018   HCT 44.0 07/17/2018   PLT 216.0 07/17/2018   GLUCOSE 155 (H) 07/17/2018   CHOL 178 07/17/2018   TRIG 248.0 (H)  07/17/2018   HDL 39.90 07/17/2018   LDLDIRECT 89.0 07/17/2018   LDLCALC 90 08/09/2013   ALT 21 07/17/2018   AST 17 07/17/2018   NA 138 07/17/2018   K 4.4 07/17/2018   CL 101 07/17/2018   CREATININE 0.97 07/17/2018   BUN 12 07/17/2018   CO2 31 07/17/2018   TSH 1.37 07/17/2018   PSA 0.39 07/17/2018   INR 0.84 02/22/2010   HGBA1C 6.2 07/17/2018         Assessment & Plan:

## 2018-07-17 NOTE — Assessment & Plan Note (Signed)
Resolved with irrigation,  to f/u any worsening symptoms or concerns 

## 2018-07-17 NOTE — Patient Instructions (Addendum)
You had the Prevnar 13 pneumonia shot today  OK to increase the hydralazine to 50 mg three times per day  Your left ear was irrigated of wax today  Please continue all other medications as before, and refills have been done if requested.  Please have the pharmacy call with any other refills you may need.  Please continue your efforts at being more active, low cholesterol diet, and weight control.  You are otherwise up to date with prevention measures today.  Please keep your appointments with your specialists as you may have planned  Please go to the LAB in the Basement (turn left off the elevator) for the tests to be done today  You will be contacted by phone if any changes need to be made immediately.  Otherwise, you will receive a letter about your results with an explanation, but please check with MyChart first.  Please remember to sign up for MyChart if you have not done so, as this will be important to you in the future with finding out test results, communicating by private email, and scheduling acute appointments online when needed.  Please return in 6 months, or sooner if needed, to recheck the Blood Pressure

## 2018-07-17 NOTE — Assessment & Plan Note (Signed)
stable overall by history and exam, recent data reviewed with pt, and pt to continue medical treatment as before,  to f/u any worsening symptoms or concerns Lab Results  Component Value Date   LDLCALC 90 08/09/2013

## 2018-08-03 DIAGNOSIS — K629 Disease of anus and rectum, unspecified: Secondary | ICD-10-CM | POA: Diagnosis not present

## 2018-08-03 DIAGNOSIS — M545 Low back pain: Secondary | ICD-10-CM | POA: Diagnosis not present

## 2018-08-03 DIAGNOSIS — Z79891 Long term (current) use of opiate analgesic: Secondary | ICD-10-CM | POA: Diagnosis not present

## 2018-08-03 DIAGNOSIS — G894 Chronic pain syndrome: Secondary | ICD-10-CM | POA: Diagnosis not present

## 2018-08-20 ENCOUNTER — Other Ambulatory Visit: Payer: Self-pay | Admitting: Internal Medicine

## 2018-09-17 DIAGNOSIS — J209 Acute bronchitis, unspecified: Secondary | ICD-10-CM | POA: Diagnosis not present

## 2018-09-17 DIAGNOSIS — R05 Cough: Secondary | ICD-10-CM | POA: Diagnosis not present

## 2018-09-17 DIAGNOSIS — Z79899 Other long term (current) drug therapy: Secondary | ICD-10-CM | POA: Diagnosis not present

## 2018-09-17 DIAGNOSIS — R0602 Shortness of breath: Secondary | ICD-10-CM | POA: Diagnosis not present

## 2018-09-17 DIAGNOSIS — R7989 Other specified abnormal findings of blood chemistry: Secondary | ICD-10-CM | POA: Diagnosis not present

## 2018-09-17 DIAGNOSIS — J40 Bronchitis, not specified as acute or chronic: Secondary | ICD-10-CM | POA: Diagnosis not present

## 2018-09-17 DIAGNOSIS — R590 Localized enlarged lymph nodes: Secondary | ICD-10-CM | POA: Diagnosis not present

## 2018-09-17 DIAGNOSIS — R918 Other nonspecific abnormal finding of lung field: Secondary | ICD-10-CM | POA: Diagnosis not present

## 2018-09-28 DIAGNOSIS — G894 Chronic pain syndrome: Secondary | ICD-10-CM | POA: Diagnosis not present

## 2018-09-28 DIAGNOSIS — K629 Disease of anus and rectum, unspecified: Secondary | ICD-10-CM | POA: Diagnosis not present

## 2018-09-28 DIAGNOSIS — M545 Low back pain: Secondary | ICD-10-CM | POA: Diagnosis not present

## 2018-09-28 DIAGNOSIS — Z79891 Long term (current) use of opiate analgesic: Secondary | ICD-10-CM | POA: Diagnosis not present

## 2018-11-23 DIAGNOSIS — K629 Disease of anus and rectum, unspecified: Secondary | ICD-10-CM | POA: Diagnosis not present

## 2018-11-23 DIAGNOSIS — G894 Chronic pain syndrome: Secondary | ICD-10-CM | POA: Diagnosis not present

## 2018-11-23 DIAGNOSIS — Z79891 Long term (current) use of opiate analgesic: Secondary | ICD-10-CM | POA: Diagnosis not present

## 2018-11-23 DIAGNOSIS — M545 Low back pain: Secondary | ICD-10-CM | POA: Diagnosis not present

## 2018-11-24 ENCOUNTER — Encounter: Payer: Self-pay | Admitting: Internal Medicine

## 2018-11-24 ENCOUNTER — Ambulatory Visit (INDEPENDENT_AMBULATORY_CARE_PROVIDER_SITE_OTHER): Payer: Medicare Other | Admitting: Internal Medicine

## 2018-11-24 DIAGNOSIS — I10 Essential (primary) hypertension: Secondary | ICD-10-CM

## 2018-11-24 DIAGNOSIS — R7302 Impaired glucose tolerance (oral): Secondary | ICD-10-CM | POA: Diagnosis not present

## 2018-11-24 DIAGNOSIS — M25512 Pain in left shoulder: Secondary | ICD-10-CM | POA: Diagnosis not present

## 2018-11-24 MED ORDER — AMLODIPINE-OLMESARTAN 5-40 MG PO TABS: 1.0000 | ORAL_TABLET | Freq: Every day | ORAL | 3 refills | Status: DC

## 2018-11-24 NOTE — Assessment & Plan Note (Signed)
stable overall by history and exam, recent data reviewed with pt, and pt to continue medical treatment as before,  to f/u any worsening symptoms or concerns  

## 2018-11-24 NOTE — Progress Notes (Signed)
Subjective:    Patient ID: Ryan Davila, male    DOB: [birth year], 67 y.o.   MRN: [id number]    HPI  Here with 6 months left shoudler pain after tripped over a bungee cord in the yard, but caught in the cord, and sort of gave way so he went forward and caught himself somewhat with the arms and shoulder, and right shoulder pain resolved fairly quickly, but left persists, mild, constant, wihtout radation or neck pain, but worse to try to raise overhead or lift a heavy object.  Has hx of rot cuff repair 2003 (not sure which shoulder) but this seems somewhat different, no popping this time.  Nothing else seems to make better or worse.  Constant achy like.  Pt denies chest pain, increased sob or doe, wheezing, orthopnea, PND, increased LE swelling, palpitations, dizziness or syncope.  Pt denies new neurological symptoms such as new headache, or facial or extremity weakness or numbness   Pt denies polydipsia, polyuria Past Medical History:  Diagnosis Date  . ALLERGIC RHINITIS 04/18/2008  . Allergy   . Blood transfusion without reported diagnosis    as infant  . Cancer [facility])    colon cancer  . COLON CANCER, HX OF 04/18/2008  . DEPRESSION 04/18/2008  . GERD 04/18/2008  . HEPATITIS B, HX OF 04/18/2008  . HYPERLIPIDEMIA 04/18/2008  . HYPERTENSION 04/18/2008  . Impaired glucose tolerance 04/23/2011   Past Surgical History:  Procedure Laterality Date  . APPENDECTOMY    . COLONOSCOPY    . COLOSTOMY    . 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 POLYPECTOMY    . RECTAL SURGERY  2003   AP resection  . REPAIR FLEXOR TENDON HAND    . Rotator cuff surgery    . TONSILLECTOMY      reports that he has quit smoking. He has quit using smokeless tobacco. He reports that he drinks alcohol. He reports that he does not use drugs. family history includes Breast cancer in his maternal aunt; Colon cancer in his maternal grandfather; Diabetes in his father and sister; Prostate cancer in his maternal  uncle. No Known Allergies Current Outpatient Medications on File Prior to Visit  Medication Sig Dispense Refill  . albuterol (PROAIR HFA) 108 (90 Base) MCG/ACT inhaler TAKE 2 PUFFS BY MOUTH EVERY 6 HOURS AS NEEDED FOR WHEEZE 25.5 Inhaler 3  . amLODipine-olmesartan (AZOR) 5-40 MG tablet Take 1 tablet by mouth daily. 90 tablet 3  . aspirin EC 81 MG tablet Take by mouth daily.     . carvedilol (COREG) 25 MG tablet Take 1 tablet (25 mg total) by mouth 2 (two) times daily with a meal. 180 tablet 3  . hydrALAZINE (APRESOLINE) 50 MG tablet Take 1 tablet (50 mg total) by mouth 3 (three) times daily. 270 tablet 3  . HYDROcodone-acetaminophen (VICODIN) 5-500 MG per tablet Take 1 tablet by mouth every 8 (eight) hours as needed.     . latanoprost (XALATAN) 0.005 % ophthalmic solution Place 1 drop into both eyes at bedtime. 0.005 %    . lovastatin (MEVACOR) 40 MG tablet TAKE 1 TABLET DAILY 90 tablet 3  . methadone (DOLOPHINE) 5 MG tablet Take 5 mg by mouth daily. 5mg  taken 5 times a day. Total 25mg  per day.    . mirtazapine (REMERON) 15 MG tablet Take 1 tablet (15 mg total) by mouth at bedtime. 90 tablet 3  . tamsulosin (FLOMAX) 0.4 MG CAPS  capsule TAKE 1 CAPSULE DAILY 90 capsule 1  . triamcinolone cream (KENALOG) 0.1 % Apply topically 2 (two) times daily. 30 g 0  . nitroGLYCERIN (NITROSTAT) 0.4 MG SL tablet Place 1 tablet (0.4 mg total) under the tongue every 5 (five) minutes as needed for chest pain. 40 tablet 5   No current facility-administered medications on file prior to visit.    Review of Systems  Constitutional: Negative for other unusual diaphoresis or sweats HENT: Negative for ear discharge or swelling Eyes: Negative for other worsening visual disturbances Respiratory: Negative for stridor or other swelling  Gastrointestinal: Negative for worsening distension or other blood Genitourinary: Negative for retention or other urinary change Musculoskeletal: Negative for other MSK pain or  swelling Skin: Negative for color change or other new lesions Neurological: Negative for worsening tremors and other numbness  Psychiatric/Behavioral: Negative for worsening agitation or other fatigue All other system neg per pt    Objective:   Physical Exam BP 124/70   Pulse 66   Temp 97.8 F (36.6 C) (Oral)   Ht 6\' 2"  (1.88 m)   Wt 262 lb (118.8 kg)   SpO2 94%   BMI 33.64 kg/m  VS noted,  Constitutional: Pt appears in NAD HENT: Head: NCAT.  Right Ear: External ear normal.  Left Ear: External ear normal.  Eyes: . Pupils are equal, round, and reactive to light. Conjunctivae and EOM are normal Nose: without d/c or deformity Neck: Neck supple. Gross normal ROM Cardiovascular: Normal rate and regular rhythm.   Pulmonary/Chest: Effort normal and breath sounds without rales or wheezing.  Abd:  Soft, NT, ND, + BS, no organomegaly ;left shoulder NT without swelling, but pain with abduction and forward elevation to about 110 degrees only Neurological: Pt is alert. At baseline orientation, motor grossly intact Skin: Skin is warm. No rashes, other new lesions, no LE edema Psychiatric: Pt behavior is normal without agitation  No other exam findings Lab Results  Component Value Date   WBC 7.2 07/17/2018   HGB 15.2 07/17/2018   HCT 44.0 07/17/2018   PLT 216.0 07/17/2018   GLUCOSE 155 (H) 07/17/2018   CHOL 178 07/17/2018   TRIG 248.0 (H) 07/17/2018   HDL 39.90 07/17/2018   LDLDIRECT 89.0 07/17/2018   LDLCALC 90 08/09/2013   ALT 21 07/17/2018   AST 17 07/17/2018   NA 138 07/17/2018   K 4.4 07/17/2018   CL 101 07/17/2018   CREATININE 0.97 07/17/2018   BUN 12 07/17/2018   CO2 31 07/17/2018   TSH 1.37 07/17/2018   PSA 0.39 07/17/2018   INR 0.84 02/22/2010   HGBA1C 6.2 07/17/2018       Assessment & Plan:

## 2018-11-24 NOTE — Patient Instructions (Signed)
Please continue all other medications as before, and refills have been done if requested - the lotrel for blood pressure  Please have the pharmacy call with any other refills you may need.  Please continue your efforts at being more active, low cholesterol diet, and weight control  Please keep your appointments with your specialists as you may have planned  You will be contacted regarding the referral for: MRI for the left shoulder, and orthopedic - Dr Veverly Fells

## 2018-11-24 NOTE — Assessment & Plan Note (Signed)
Exam most c/w repeat tear left rot cuff, to cont methadone, for MRI left shoulder wo cm, and refer ortho Dr Veverly Fells

## 2018-12-20 ENCOUNTER — Ambulatory Visit
Admission: RE | Admit: 2018-12-20 | Discharge: 2018-12-20 | Disposition: A | Discharge status: Home / Self Care | Payer: Medicare Other | Source: Ambulatory Visit | Attending: Internal Medicine | Admitting: Internal Medicine

## 2018-12-20 DIAGNOSIS — M25512 Pain in left shoulder: Secondary | ICD-10-CM

## 2018-12-20 DIAGNOSIS — M75101 Unspecified rotator cuff tear or rupture of right shoulder, not specified as traumatic: Secondary | ICD-10-CM | POA: Diagnosis not present

## 2018-12-24 DIAGNOSIS — S43432A Superior glenoid labrum lesion of left shoulder, initial encounter: Secondary | ICD-10-CM | POA: Insufficient documentation

## 2018-12-24 DIAGNOSIS — H401131 Primary open-angle glaucoma, bilateral, mild stage: Secondary | ICD-10-CM | POA: Diagnosis not present

## 2018-12-24 DIAGNOSIS — M25512 Pain in left shoulder: Secondary | ICD-10-CM | POA: Insufficient documentation

## 2018-12-29 DIAGNOSIS — M25512 Pain in left shoulder: Secondary | ICD-10-CM | POA: Diagnosis not present

## 2018-12-29 DIAGNOSIS — S43432A Superior glenoid labrum lesion of left shoulder, initial encounter: Secondary | ICD-10-CM | POA: Diagnosis not present

## 2019-01-19 ENCOUNTER — Ambulatory Visit (INDEPENDENT_AMBULATORY_CARE_PROVIDER_SITE_OTHER): Payer: Medicare Other | Admitting: Internal Medicine

## 2019-01-19 ENCOUNTER — Encounter: Payer: Self-pay | Admitting: Internal Medicine

## 2019-01-19 VITALS — BP 128/82 | HR 75 | Temp 97.9°F | Ht 74.0 in | Wt 260.0 lb

## 2019-01-19 DIAGNOSIS — I10 Essential (primary) hypertension: Secondary | ICD-10-CM | POA: Diagnosis not present

## 2019-01-19 DIAGNOSIS — E785 Hyperlipidemia, unspecified: Secondary | ICD-10-CM | POA: Diagnosis not present

## 2019-01-19 DIAGNOSIS — R7302 Impaired glucose tolerance (oral): Secondary | ICD-10-CM | POA: Diagnosis not present

## 2019-01-19 LAB — POCT GLYCOSYLATED HEMOGLOBIN (HGB A1C): Hemoglobin A1C: 6.3 % — AB (ref 4.0–5.6)

## 2019-01-19 NOTE — Assessment & Plan Note (Signed)
stable overall by history and exam, recent data reviewed with pt, and pt to continue medical treatment as before,  to f/u any worsening symptoms or concerns  

## 2019-01-19 NOTE — Patient Instructions (Signed)
Your A1c was OK today  Please continue all other medications as before, and refills have been done if requested.  Please have the pharmacy call with any other refills you may need.  Please continue your efforts at being more active, low cholesterol diet, and weight control.  Please keep your appointments with your specialists as you may have planned  Please return in 6 months, or sooner if needed 

## 2019-01-19 NOTE — Assessment & Plan Note (Signed)
stable overall by history and exam, recent data reviewed with pt, and pt to continue medical treatment as before,  to f/u any worsening symptoms or concerns, for labs 

## 2019-01-19 NOTE — Progress Notes (Signed)
Subjective:    Patient ID: Ryan Davila, male    DOB: 1951-05-06, 68 y.o.   MRN: [id number]  HPI  Here to f/u; overall doing ok,  Pt denies chest pain, increasing sob or doe, wheezing, orthopnea, PND, increased LE swelling, palpitations, dizziness or syncope.  Pt denies new neurological symptoms such as new headache, or facial or extremity weakness or numbness.  Pt denies polydipsia, polyuria, or low sugar episode.  Pt states overall good compliance with meds, mostly trying to follow appropriate diet, with wt overall stable,  but little exercise however. Denies urinary symptoms such as dysuria, frequency, urgency, flank pain, hematuria or n/v, fever, chills.  Denies worsening reflux, abd pain, dysphagia, n/v, bowel change or blood.   Pt denies fever, wt loss, night sweats, loss of appetite, or other constitutional symptoms Past Medical History:  Diagnosis Date  . ALLERGIC RHINITIS 04/18/2008  . Allergy   . Blood transfusion without reported diagnosis    as infant  . Cancer [facility])    colon cancer  . COLON CANCER, HX OF 04/18/2008  . DEPRESSION 04/18/2008  . GERD 04/18/2008  . HEPATITIS B, HX OF 04/18/2008  . HYPERLIPIDEMIA 04/18/2008  . HYPERTENSION 04/18/2008  . Impaired glucose tolerance 04/23/2011   Past Surgical History:  Procedure Laterality Date  . APPENDECTOMY    . COLONOSCOPY    . COLOSTOMY    . 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 POLYPECTOMY    . RECTAL SURGERY  2003   AP resection  . REPAIR FLEXOR TENDON HAND    . Rotator cuff surgery    . TONSILLECTOMY      reports that he has quit smoking. He has quit using smokeless tobacco. He reports current alcohol use. He reports that he does not use drugs. family history includes Breast cancer in his maternal aunt; Colon cancer in his maternal grandfather; Diabetes in his father and sister; Prostate cancer in his maternal uncle. No Known Allergies Current Outpatient Medications on File Prior to Visit    Medication Sig Dispense Refill  . albuterol (PROAIR HFA) 108 (90 Base) MCG/ACT inhaler TAKE 2 PUFFS BY MOUTH EVERY 6 HOURS AS NEEDED FOR WHEEZE 25.5 Inhaler 3  . amLODipine-olmesartan (AZOR) 5-40 MG tablet Take 1 tablet by mouth daily. 90 tablet 3  . aspirin EC 81 MG tablet Take by mouth daily.     . carvedilol (COREG) 25 MG tablet Take 1 tablet (25 mg total) by mouth 2 (two) times daily with a meal. 180 tablet 3  . hydrALAZINE (APRESOLINE) 50 MG tablet Take 1 tablet (50 mg total) by mouth 3 (three) times daily. 270 tablet 3  . HYDROcodone-acetaminophen (VICODIN) 5-500 MG per tablet Take 1 tablet by mouth every 8 (eight) hours as needed.     . latanoprost (XALATAN) 0.005 % ophthalmic solution Place 1 drop into both eyes at bedtime. 0.005 %    . lovastatin (MEVACOR) 40 MG tablet TAKE 1 TABLET DAILY 90 tablet 3  . methadone (DOLOPHINE) 5 MG tablet Take 5 mg by mouth daily. 5mg  taken 5 times a day. Total 25mg  per day.    . mirtazapine (REMERON) 15 MG tablet Take 1 tablet (15 mg total) by mouth at bedtime. 90 tablet 3  . tamsulosin (FLOMAX) 0.4 MG CAPS capsule TAKE 1 CAPSULE DAILY 90 capsule 1  . triamcinolone cream (KENALOG) 0.1 % Apply topically 2 (two) times daily. 30 g 0  . nitroGLYCERIN (NITROSTAT) 0.4  MG SL tablet Place 1 tablet (0.4 mg total) under the tongue every 5 (five) minutes as needed for chest pain. 40 tablet 5   No current facility-administered medications on file prior to visit.    Review of Systems  Constitutional: Negative for other unusual diaphoresis or sweats HENT: Negative for ear discharge or swelling Eyes: Negative for other worsening visual disturbances Respiratory: Negative for stridor or other swelling  Gastrointestinal: Negative for worsening distension or other blood Genitourinary: Negative for retention or other urinary change Musculoskeletal: Negative for other MSK pain or swelling Skin: Negative for color change or other new lesions Neurological: Negative for  worsening tremors and other numbness  Psychiatric/Behavioral: Negative for worsening agitation or other fatigue All other system neg per pt    Objective:   Physical Exam BP 128/82   Pulse 75   Temp 97.9 F (36.6 C) (Oral)   Ht 6\' 2"  (1.88 m)   Wt 260 lb (117.9 kg)   SpO2 96%   BMI 33.38 kg/m  VS noted,  Constitutional: Pt appears in NAD HENT: Head: NCAT.  Right Ear: External ear normal.  Left Ear: External ear normal.  Eyes: . Pupils are equal, round, and reactive to light. Conjunctivae and EOM are normal Nose: without d/c or deformity Neck: Neck supple. Gross normal ROM Cardiovascular: Normal rate and regular rhythm.   Pulmonary/Chest: Effort normal and breath sounds without rales or wheezing.  Abd:  Soft, NT, ND, + BS, no organomegaly Neurological: Pt is alert. At baseline orientation, motor grossly intact Skin: Skin is warm. No rashes, other new lesions, no LE edema Psychiatric: Pt behavior is normal without agitation  No other exam findings  POCT glycosylated hemoglobin (Hb A1C)  Order: [id number]  Status:  Final result Visible to patient:  No (Not Released) Dx:  Impaired glucose tolerance   Ref Range & Units 10:33 66mo ago 5yr ago 20yr ago 48yr ago 58yr ago 9yr ago  Hemoglobin A1C 4.0 - 5.6 % 6.3Abnormal   6.2 R, CM 5.8 R, CM 6.1 R, CM 6.2 R, CM 5.8 R, CM 5.5 R,            Assessment & Plan:

## 2019-01-20 ENCOUNTER — Telehealth: Payer: Self-pay

## 2019-01-20 NOTE — Telephone Encounter (Signed)
Noted   Copied from [place] [id number]. Topic: General - Other >> Jan 20, 2019  9:59 AM Yvette Rack wrote: Reason for CRM: FYI pt wife Benjamine Mola calling stating that [facility] medical is faxing over a form for doctor to sign for patients ostomy

## 2019-01-25 NOTE — Telephone Encounter (Signed)
Ryan Davila with [facility] calling to check the status of the fax taht they sent over. Spoke with Sam and was advised that the paperwork was completed and faxed back last week. Attempted to advised Ryan Davila of this information and there was no one on the line. If she calls back, please advise.

## 2019-01-26 NOTE — Telephone Encounter (Addendum)
They will have to have it faxed again since I do not see the original scanned in the media tab. Informed pt's wife and she will have them fax it over again. I told her that I will hold onto if for a few days in case this happens again.

## 2019-01-26 NOTE — Telephone Encounter (Signed)
Patient wife is calling and states that [facility] states they still have not received the fax and would like a call back with the date it was faxed to them and if this can be refaxed.   CB# [phone]

## 2019-01-29 NOTE — Telephone Encounter (Signed)
Kayla from [place] called to check on the status of the faxed prescription for the Pt's Ostomy supplies/ advised her to resend the fax/ please look out for fax, complete and send back as soon as possible

## 2019-02-01 NOTE — Telephone Encounter (Signed)
Pt's wife called and asked to please be informed once form has been filled out and sent back to [facility]. Please advise once this is done. YP#[phone]

## 2019-02-01 NOTE — Telephone Encounter (Signed)
Pt's wife has been informed.  

## 2019-02-01 NOTE — Telephone Encounter (Signed)
Form has been completed and faxed back.

## 2019-02-01 NOTE — Telephone Encounter (Signed)
Form was received and placed on PCP's desk.

## 2019-02-08 DIAGNOSIS — Z79891 Long term (current) use of opiate analgesic: Secondary | ICD-10-CM | POA: Diagnosis not present

## 2019-02-08 DIAGNOSIS — K629 Disease of anus and rectum, unspecified: Secondary | ICD-10-CM | POA: Diagnosis not present

## 2019-02-08 DIAGNOSIS — M545 Low back pain: Secondary | ICD-10-CM | POA: Diagnosis not present

## 2019-02-08 DIAGNOSIS — G894 Chronic pain syndrome: Secondary | ICD-10-CM | POA: Diagnosis not present

## 2019-02-24 ENCOUNTER — Other Ambulatory Visit: Payer: Self-pay | Admitting: Internal Medicine

## 2019-03-01 ENCOUNTER — Other Ambulatory Visit: Payer: Self-pay

## 2019-03-01 ENCOUNTER — Encounter: Payer: Self-pay | Admitting: Internal Medicine

## 2019-03-01 ENCOUNTER — Ambulatory Visit (INDEPENDENT_AMBULATORY_CARE_PROVIDER_SITE_OTHER): Payer: Medicare Other | Admitting: Internal Medicine

## 2019-03-01 ENCOUNTER — Ambulatory Visit: Payer: Self-pay | Admitting: *Deleted

## 2019-03-01 ENCOUNTER — Other Ambulatory Visit (INDEPENDENT_AMBULATORY_CARE_PROVIDER_SITE_OTHER): Payer: Medicare Other

## 2019-03-01 VITALS — BP 150/62 | HR 73 | Temp 98.1°F | Ht 74.0 in | Wt 256.0 lb

## 2019-03-01 DIAGNOSIS — R110 Nausea: Secondary | ICD-10-CM

## 2019-03-01 DIAGNOSIS — I10 Essential (primary) hypertension: Secondary | ICD-10-CM

## 2019-03-01 DIAGNOSIS — R7302 Impaired glucose tolerance (oral): Secondary | ICD-10-CM

## 2019-03-01 LAB — BASIC METABOLIC PANEL
BUN: 15 mg/dL (ref 6–23)
CO2: 29 mEq/L (ref 19–32)
Calcium: 9.5 mg/dL (ref 8.4–10.5)
Chloride: 101 mEq/L (ref 96–112)
Creatinine, Ser: 1.13 mg/dL (ref 0.40–1.50)
GFR: 64.85 mL/min (ref >60.00)
Glucose, Bld: 159 mg/dL — ABNORMAL HIGH (ref 70–99)
Potassium: 4.8 mEq/L (ref 3.5–5.1)
Sodium: 138 mEq/L (ref 135–145)

## 2019-03-01 LAB — CBC WITH DIFFERENTIAL/PLATELET
Basophils Absolute: 0.1 10*3/uL (ref 0.0–0.1)
Basophils Relative: 0.8 % (ref 0.0–3.0)
EOS PCT: 1.7 % (ref 0.0–5.0)
Eosinophils Absolute: 0.2 10*3/uL (ref 0.0–0.7)
HCT: 44.8 % (ref 39.0–52.0)
Hemoglobin: 15.4 g/dL (ref 13.0–17.0)
Lymphocytes Relative: 22.9 % (ref 12.0–46.0)
Lymphs Abs: 2.2 10*3/uL (ref 0.7–4.0)
MCHC: 34.4 g/dL (ref 30.0–36.0)
MCV: 89.2 fl (ref 78.0–100.0)
Monocytes Absolute: 0.7 10*3/uL (ref 0.1–1.0)
Monocytes Relative: 7.2 % (ref 3.0–12.0)
Neutro Abs: 6.6 10*3/uL (ref 1.4–7.7)
Neutrophils Relative %: 67.4 % (ref 43.0–77.0)
Platelets: 236 10*3/uL (ref 150.0–400.0)
RBC: 5.03 Mil/uL (ref 4.22–5.81)
RDW: 12.7 % (ref 11.5–15.5)
WBC: 9.8 10*3/uL (ref 4.0–10.5)

## 2019-03-01 LAB — HEMOGLOBIN A1C: HEMOGLOBIN A1C: 6.4 % (ref 4.6–6.5)

## 2019-03-01 LAB — HEPATIC FUNCTION PANEL
ALT: 17 U/L (ref 0–53)
AST: 14 U/L (ref 0–37)
Albumin: 4.2 g/dL (ref 3.5–5.2)
Alkaline Phosphatase: 54 U/L (ref 39–117)
Bilirubin, Direct: 0.1 mg/dL (ref 0.0–0.3)
Total Bilirubin: 0.8 mg/dL (ref 0.2–1.2)
Total Protein: 7.1 g/dL (ref 6.0–8.3)

## 2019-03-01 LAB — LIPASE: Lipase: 21 U/L (ref 11.0–59.0)

## 2019-03-01 MED ORDER — PANTOPRAZOLE SODIUM 40 MG PO TBEC: 40.0000 mg | DELAYED_RELEASE_TABLET | Freq: Every day | ORAL | 3 refills | Status: DC

## 2019-03-01 MED ORDER — ONDANSETRON HCL 4 MG PO TABS: 4.0000 mg | ORAL_TABLET | Freq: Three times a day (TID) | ORAL | 1 refills | Status: DC | PRN

## 2019-03-01 NOTE — Assessment & Plan Note (Signed)
stable overall by history and exam, recent data reviewed with pt, and pt to continue medical treatment as before,  to f/u any worsening symptoms or concerns  

## 2019-03-01 NOTE — Progress Notes (Signed)
Subjective:    Patient ID: Ryan Davila, male    DOB: [birth year], 68 y.o.   MRN: [id number]  HPI  Here with c/o 1 wk fatigue, sleeping more, generalized weakness, dizziness (not vertigo like before) and nausea intermittent but more often than not; denies fever, ST, cough, CP, sob, abd pain, dysuria.  Is s/p colon ca surgury with colostomy, less output recently assoc with some bloating, and even worsening nausea, and pt suspects may be more dehydrated.  Is on chronic opiate with risk for constipation  Maybe having some inreased reflux recently and took TUMS twice this wk.  Has had several dry heaves, but no hematamesis or BRB in colostomy bag.  Good compliance with miralax.  No ETOH recently, No NSAIDS.   Pt denies polydipsia, polyuria,    Past Medical History:  Diagnosis Date  . ALLERGIC RHINITIS 04/18/2008  . Allergy   . Blood transfusion without reported diagnosis    as infant  . Cancer [facility])    colon cancer  . COLON CANCER, HX OF 04/18/2008  . DEPRESSION 04/18/2008  . GERD 04/18/2008  . HEPATITIS B, HX OF 04/18/2008  . HYPERLIPIDEMIA 04/18/2008  . HYPERTENSION 04/18/2008  . Impaired glucose tolerance 04/23/2011   Past Surgical History:  Procedure Laterality Date  . APPENDECTOMY    . COLONOSCOPY    . COLOSTOMY    . 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 POLYPECTOMY    . RECTAL SURGERY  2003   AP resection  . REPAIR FLEXOR TENDON HAND    . Rotator cuff surgery    . TONSILLECTOMY      reports that he has quit smoking. He has quit using smokeless tobacco. He reports current alcohol use. He reports that he does not use drugs. family history includes Breast cancer in his maternal aunt; Colon cancer in his maternal grandfather; Diabetes in his father and sister; Prostate cancer in his maternal uncle. No Known Allergies Current Outpatient Medications on File Prior to Visit  Medication Sig Dispense Refill  . albuterol (PROAIR HFA) 108 (90 Base) MCG/ACT inhaler TAKE 2  PUFFS BY MOUTH EVERY 6 HOURS AS NEEDED FOR WHEEZE 25.5 Inhaler 3  . amLODipine-olmesartan (AZOR) 5-40 MG tablet Take 1 tablet by mouth daily. 90 tablet 3  . aspirin EC 81 MG tablet Take by mouth daily.     . carvedilol (COREG) 25 MG tablet Take 1 tablet (25 mg total) by mouth 2 (two) times daily with a meal. 180 tablet 3  . hydrALAZINE (APRESOLINE) 50 MG tablet Take 1 tablet (50 mg total) by mouth 3 (three) times daily. 270 tablet 3  . HYDROcodone-acetaminophen (VICODIN) 5-500 MG per tablet Take 1 tablet by mouth every 8 (eight) hours as needed.     . latanoprost (XALATAN) 0.005 % ophthalmic solution Place 1 drop into both eyes at bedtime. 0.005 %    . lovastatin (MEVACOR) 40 MG tablet TAKE 1 TABLET DAILY 90 tablet 3  . methadone (DOLOPHINE) 5 MG tablet Take 5 mg by mouth daily. 5mg  taken 5 times a day. Total 25mg  per day.    . mirtazapine (REMERON) 15 MG tablet Take 1 tablet (15 mg total) by mouth at bedtime. 90 tablet 3  . tamsulosin (FLOMAX) 0.4 MG CAPS capsule TAKE 1 CAPSULE DAILY 90 capsule 1  . triamcinolone cream (KENALOG) 0.1 % Apply topically 2 (two) times daily. 30 g 0  . nitroGLYCERIN (NITROSTAT) 0.4 MG SL tablet Place  1 tablet (0.4 mg total) under the tongue every 5 (five) minutes as needed for chest pain. 40 tablet 5   No current facility-administered medications on file prior to visit.    Review of Systems  Constitutional: Negative for other unusual diaphoresis or sweats HENT: Negative for ear discharge or swelling Eyes: Negative for other worsening visual disturbances Respiratory: Negative for stridor or other swelling  Gastrointestinal: Negative for worsening distension or other blood Genitourinary: Negative for retention or other urinary change Musculoskeletal: Negative for other MSK pain or swelling Skin: Negative for color change or other new lesions Neurological: Negative for worsening tremors and other numbness  Psychiatric/Behavioral: Negative for worsening agitation  or other fatigue All other system neg pre pt    Objective:   Physical Exam BP (!) 150/62 (BP Location: Left Arm, Patient Position: Sitting, Cuff Size: Large)   Pulse 73   Temp 98.1 F (36.7 C) (Oral)   Ht 6\' 2"  (1.88 m)   Wt 256 lb (116.1 kg)   SpO2 95%   BMI 32.87 kg/m  VS noted, not ill appearing Constitutional: Pt appears in NAD HENT: Head: NCAT.  Right Ear: External ear normal.  Left Ear: External ear normal.  Eyes: . Pupils are equal, round, and reactive to light. Conjunctivae and EOM are normal Nose: without d/c or deformity Neck: Neck supple. Gross normal ROM Cardiovascular: Normal rate and regular rhythm.   Pulmonary/Chest: Effort normal and breath sounds without rales or wheezing.  Abd:  Soft, NT, ND, + BS, no organomegaly, with coloscopy site intact and unremarkable to LLQ, no guarding or rebound Neurological: Pt is alert. At baseline orientation, motor grossly intact Skin: Skin is warm. No rashes, other new lesions, no LE edema Psychiatric: Pt behavior is normal without agitation  No other exam findings    Assessment & Plan:

## 2019-03-01 NOTE — Assessment & Plan Note (Addendum)
With weakness and dizzy, few dry heaves, occasional reflux, bloating and increased stool hardness/constipation recently assoc with some decreased po intake and chronic opiate use; no s/s infectious etiology or obstruction;  For trial PPI and zofran prn, would hold on imaging for now, but check routine labs r/o signifcant renal or other abnormal, encouraged increased po fluids

## 2019-03-01 NOTE — Patient Instructions (Addendum)
Please take all new medication as prescribed - the trial of protonix for acid, and zofran as needed for nausea  Please work on increased fluids in the next few days  Please continue all other medications as before, including the methadone  Please have the pharmacy call with any other refills you may need.  Please continue your efforts at being more active, low cholesterol diet, and weight control.  Please keep your appointments with your specialists as you may have planned  Please go to the LAB in the Basement (turn left off the elevator) for the tests to be done today  You will be contacted by phone if any changes need to be made immediately.  Otherwise, you will receive a letter about your results with an explanation, but please check with MyChart first.  Please remember to sign up for MyChart if you have not done so, as this will be important to you in the future with finding out test results, communicating by private email, and scheduling acute appointments online when needed.  P

## 2019-03-01 NOTE — Telephone Encounter (Addendum)
Pt called with complaints of intermittent dizziness and nausea starting 02/22/2019; he says that he is hungry and thirsty most of the time, but the dizziness and nausea is at its worse at that time; the pt says that he had issues with dizziness due to his ears; his BP is 142/81, pulse 72; recommendations made per nurse triage protocol; pt offered and accepted appointment with DR Cathlean Cower, [facility], 03/01/2019 at 0920; he verbalized understanding; will route to office for notification.  Reason for Disposition . Taking a medicine that could cause dizziness (e.g., blood pressure medications, diuretics)  Answer Assessment - Initial Assessment Questions 1. DESCRIPTION: "Describe your dizziness."     Light heaed 2. LIGHTHEADED: "Do you feel lightheaded?" (e.g., somewhat faint, woozy, weak upon standing)     woozy 3. VERTIGO: "Do you feel like either you or the room is spinning or tilting?" (i.e. vertigo)     no 4. SEVERITY: "How bad is it?"  "Do you feel like you are going to faint?" "Can you stand and walk?"   - MILD - walking normally   - MODERATE - interferes with normal activities (e.g., work, school)    - SEVERE - unable to stand, requires support to walk, feels like passing out now.      mild 5. ONSET:  "When did the dizziness begin?"     02/22/2019 6. AGGRAVATING FACTORS: "Does anything make it worse?" (e.g., standing, change in head position)     eating 7. HEART RATE: "Can you tell me your heart rate?" "How many beats in 15 seconds?"  (Note: not all patients can do this)      no  8. CAUSE: "What do you think is causing the dizziness?"     maybe inner ear 9. RECURRENT SYMPTOM: "Have you had dizziness before?" If so, ask: "When was the last time?" "What happened that time?"     Inner ear problems 10. OTHER SYMPTOMS: "Do you have any other symptoms?" (e.g., fever, chest pain, vomiting, diarrhea, bleeding)       Nausea, dry heaves 11. PREGNANCY: "Is there any chance you are pregnant?" "When  was your last menstrual period?"       n/a  Protocols used: DIZZINESS [facility]

## 2019-03-01 NOTE — Telephone Encounter (Signed)
Noted.  Pt is in office currently being seen.

## 2019-03-08 ENCOUNTER — Telehealth: Payer: Self-pay

## 2019-03-08 ENCOUNTER — Ambulatory Visit (INDEPENDENT_AMBULATORY_CARE_PROVIDER_SITE_OTHER): Payer: Medicare Other | Admitting: Internal Medicine

## 2019-03-08 ENCOUNTER — Ambulatory Visit (INDEPENDENT_AMBULATORY_CARE_PROVIDER_SITE_OTHER)
Admission: RE | Admit: 2019-03-08 | Discharge: 2019-03-08 | Disposition: A | Discharge status: Home / Self Care | Payer: Medicare Other | Source: Ambulatory Visit | Attending: Internal Medicine | Admitting: Internal Medicine

## 2019-03-08 ENCOUNTER — Other Ambulatory Visit: Payer: Self-pay

## 2019-03-08 ENCOUNTER — Encounter: Payer: Self-pay | Admitting: Internal Medicine

## 2019-03-08 VITALS — BP 118/70 | HR 64 | Temp 97.9°F | Ht 74.0 in | Wt 255.0 lb

## 2019-03-08 DIAGNOSIS — I10 Essential (primary) hypertension: Secondary | ICD-10-CM | POA: Diagnosis not present

## 2019-03-08 DIAGNOSIS — K5901 Slow transit constipation: Secondary | ICD-10-CM

## 2019-03-08 DIAGNOSIS — R7302 Impaired glucose tolerance (oral): Secondary | ICD-10-CM

## 2019-03-08 DIAGNOSIS — R143 Flatulence: Secondary | ICD-10-CM | POA: Diagnosis not present

## 2019-03-08 MED ORDER — LINACLOTIDE 72 MCG PO CAPS: 72.0000 ug | ORAL_CAPSULE | Freq: Every day | ORAL | 5 refills | Status: DC | PRN

## 2019-03-08 NOTE — Telephone Encounter (Signed)
No COVID-19 symptoms No fever, chills, SOB, n/v   Colostomy patient, hasn't had a good irrigation this week. He has been having this issue ongoing for a while and PCP has previously suggested an xray to see if it was and bowel build up. He would like to come in and see what his options are. Advised pt to keep appt.

## 2019-03-08 NOTE — Assessment & Plan Note (Signed)
Chronic with mild intermittent flares not well controlled this time; for trial mag citrate x 1, then dulcolox if needed, then low dose prn linzess prn, for abd films today but doubt obstruction, though partial cannot be ruled out

## 2019-03-08 NOTE — Progress Notes (Signed)
Subjective:    Patient ID: Ryan Davila, male    DOB: 11/02/1951, 68 y.o.   MRN: [id number]  HPI  Here to f/u recent visit last wk with nausea and dizziness resolved, but is still quite concerned about possible constipation due to 2 wks low volume stool from the colostomy and bloating where wife noted he seems to be somewhat distended.  Denies worsening reflux, abd pain, dysphagia, n/v, or blood. Pt denies fever, wt loss, night sweats, loss of appetite, or other constitutional symptoms  Is s/p rectal CA tx with AP resection and colostomy since.  He is concerned due to lower volume stool despite contd daily irrigation of the colostomy (even 2-3 times per day in the last few days), otc sennosides, and cont'd miralax daily.  He admits to being overall less active recently for some reason, but no significant other dietary change.  Not taking diuretic, but has been on chronic methadone since 2004.  Pt denies chest pain, increased sob or doe, wheezing, orthopnea, PND, increased LE swelling, palpitations, dizziness or syncope.  Pt denies new neurological symptoms such as new headache, or facial or extremity weakness or numbness   Pt denies polydipsia, polyuria Past Medical History:  Diagnosis Date  . ALLERGIC RHINITIS 04/18/2008  . Allergy   . Blood transfusion without reported diagnosis    as infant  . Cancer [facility])    colon cancer  . COLON CANCER, HX OF 04/18/2008  . DEPRESSION 04/18/2008  . GERD 04/18/2008  . HEPATITIS B, HX OF 04/18/2008  . HYPERLIPIDEMIA 04/18/2008  . HYPERTENSION 04/18/2008  . Impaired glucose tolerance 04/23/2011   Past Surgical History:  Procedure Laterality Date  . APPENDECTOMY    . COLONOSCOPY    . COLOSTOMY    . 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 POLYPECTOMY    . RECTAL SURGERY  2003   AP resection  . REPAIR FLEXOR TENDON HAND    . Rotator cuff surgery    . TONSILLECTOMY      reports that he has quit smoking. He has quit using smokeless  tobacco. He reports current alcohol use. He reports that he does not use drugs. family history includes Breast cancer in his maternal aunt; Colon cancer in his maternal grandfather; Diabetes in his father and sister; Prostate cancer in his maternal uncle. No Known Allergies Current Outpatient Medications on File Prior to Visit  Medication Sig Dispense Refill  . albuterol (PROAIR HFA) 108 (90 Base) MCG/ACT inhaler TAKE 2 PUFFS BY MOUTH EVERY 6 HOURS AS NEEDED FOR WHEEZE 25.5 Inhaler 3  . amLODipine-olmesartan (AZOR) 5-40 MG tablet Take 1 tablet by mouth daily. 90 tablet 3  . aspirin EC 81 MG tablet Take by mouth daily.     . carvedilol (COREG) 25 MG tablet Take 1 tablet (25 mg total) by mouth 2 (two) times daily with a meal. 180 tablet 3  . hydrALAZINE (APRESOLINE) 50 MG tablet Take 1 tablet (50 mg total) by mouth 3 (three) times daily. 270 tablet 3  . HYDROcodone-acetaminophen (VICODIN) 5-500 MG per tablet Take 1 tablet by mouth every 8 (eight) hours as needed.     . latanoprost (XALATAN) 0.005 % ophthalmic solution Place 1 drop into both eyes at bedtime. 0.005 %    . lovastatin (MEVACOR) 40 MG tablet TAKE 1 TABLET DAILY 90 tablet 3  . methadone (DOLOPHINE) 5 MG tablet Take 5 mg by mouth daily. 5mg  taken 5 times a  day. Total 25mg  per day.    . mirtazapine (REMERON) 15 MG tablet Take 1 tablet (15 mg total) by mouth at bedtime. 90 tablet 3  . ondansetron (ZOFRAN) 4 MG tablet Take 1 tablet (4 mg total) by mouth every 8 (eight) hours as needed for nausea or vomiting. 25 tablet 1  . pantoprazole (PROTONIX) 40 MG tablet Take 1 tablet (40 mg total) by mouth daily. 90 tablet 3  . tamsulosin (FLOMAX) 0.4 MG CAPS capsule TAKE 1 CAPSULE DAILY 90 capsule 1  . triamcinolone cream (KENALOG) 0.1 % Apply topically 2 (two) times daily. 30 g 0  . nitroGLYCERIN (NITROSTAT) 0.4 MG SL tablet Place 1 tablet (0.4 mg total) under the tongue every 5 (five) minutes as needed for chest pain. 40 tablet 5   No current  facility-administered medications on file prior to visit.    Review of Systems  Constitutional: Negative for other unusual diaphoresis or sweats HENT: Negative for ear discharge or swelling Eyes: Negative for other worsening visual disturbances Respiratory: Negative for stridor or other swelling  Gastrointestinal: Negative for worsening distension or other blood Genitourinary: Negative for retention or other urinary change Musculoskeletal: Negative for other MSK pain or swelling Skin: Negative for color change or other new lesions Neurological: Negative for worsening tremors and other numbness  Psychiatric/Behavioral: Negative for worsening agitation or other fatigue All other system neg per pt    Objective:   Physical Exam BP 118/70   Pulse 64   Temp 97.9 F (36.6 C) (Oral)   Ht 6\' 2"  (1.88 m)   Wt 255 lb (115.7 kg)   SpO2 95%   BMI 32.74 kg/m  VS noted, not ill appaering Constitutional: Pt appears in NAD HENT: Head: NCAT.  Right Ear: External ear normal.  Left Ear: External ear normal.  Eyes: . Pupils are equal, round, and reactive to light. Conjunctivae and EOM are normal Nose: without d/c or deformity Neck: Neck supple. Gross normal ROM Cardiovascular: Normal rate and regular rhythm.   Pulmonary/Chest: Effort normal and breath sounds without rales or wheezing.  Abd:  Soft, NT, may be slightly distended than usual, + BS, no organomegaly o/w benign exam Neurological: Pt is alert. At baseline orientation, motor grossly intact Skin: Skin is warm. No rashes, other new lesions, no LE edema Psychiatric: Pt behavior is normal without agitation  No other exam findings Lab Results  Component Value Date   WBC 9.8 03/01/2019   HGB 15.4 03/01/2019   HCT 44.8 03/01/2019   PLT 236.0 03/01/2019   GLUCOSE 159 (H) 03/01/2019   CHOL 178 07/17/2018   TRIG 248.0 (H) 07/17/2018   HDL 39.90 07/17/2018   LDLDIRECT 89.0 07/17/2018   LDLCALC 90 08/09/2013   ALT 17 03/01/2019   AST 14  03/01/2019   NA 138 03/01/2019   K 4.8 03/01/2019   CL 101 03/01/2019   CREATININE 1.13 03/01/2019   BUN 15 03/01/2019   CO2 29 03/01/2019   TSH 1.37 07/17/2018   PSA 0.39 07/17/2018   INR 0.84 02/22/2010   HGBA1C 6.4 03/01/2019       Assessment & Plan:

## 2019-03-08 NOTE — Assessment & Plan Note (Signed)
stable overall by history and exam, recent data reviewed with pt, and pt to continue medical treatment as before,  to f/u any worsening symptoms or concerns  

## 2019-03-08 NOTE — Patient Instructions (Signed)
OK to try the OTC Magnesium Citrate - 1 bottle today  OK to try the OTC Dulcolox pills if still needed  OK to try the Linzess pills if still needed after that  Please continue all other medications as before, and refills have been done if requested.  Please have the pharmacy call with any other refills you may need.  Please continue your efforts at being more active, low cholesterol diet, and weight control.  Please keep your appointments with your specialists as you may have planned  Please go to the XRAY Department in the Basement (go straight as you get off the elevator) for the x-ray testing  You will be contacted by phone if any changes need to be made immediately.  Otherwise, you will receive a letter about your results with an explanation, but please check with MyChart first.  Please remember to sign up for MyChart if you have not done so, as this will be important to you in the future with finding out test results, communicating by private email, and scheduling acute appointments online when needed.

## 2019-04-07 DIAGNOSIS — G894 Chronic pain syndrome: Secondary | ICD-10-CM | POA: Diagnosis not present

## 2019-04-07 DIAGNOSIS — Z79891 Long term (current) use of opiate analgesic: Secondary | ICD-10-CM | POA: Diagnosis not present

## 2019-04-07 DIAGNOSIS — K629 Disease of anus and rectum, unspecified: Secondary | ICD-10-CM | POA: Diagnosis not present

## 2019-04-07 DIAGNOSIS — M545 Low back pain: Secondary | ICD-10-CM | POA: Diagnosis not present

## 2019-05-24 ENCOUNTER — Telehealth: Payer: Self-pay | Admitting: Internal Medicine

## 2019-05-24 MED ORDER — AMLODIPINE-OLMESARTAN 5-40 MG PO TABS: 1.0000 | ORAL_TABLET | Freq: Every day | ORAL | 3 refills | Status: DC

## 2019-05-24 NOTE — Telephone Encounter (Signed)
Refill sent. See meds.  

## 2019-05-24 NOTE — Telephone Encounter (Signed)
Copied from [place] [phone] [id number]. Topic: Quick Communication - Rx Refill/Question >> May 24, 2019  9:39 AM Erick Blinks wrote: Medication: amLODipine-olmesartan (AZOR) 5-40 MG tablet [[id number]]  Has the patient contacted their pharmacy? Yes  (Agent: If no, request that the patient contact the pharmacy for the refill.) (Agent: If yes, when and what did the pharmacy advise?)  Preferred Pharmacy (with phone number or street name): [id number] [place], [place] - [address] [place] [address] Phone: [phone] Fax: [phone]    Agent: Please be advised that RX refills may take up to 3 business days. We ask that you follow-up with your pharmacy.

## 2019-06-02 DIAGNOSIS — M545 Low back pain: Secondary | ICD-10-CM | POA: Diagnosis not present

## 2019-06-02 DIAGNOSIS — G894 Chronic pain syndrome: Secondary | ICD-10-CM | POA: Diagnosis not present

## 2019-06-02 DIAGNOSIS — K629 Disease of anus and rectum, unspecified: Secondary | ICD-10-CM | POA: Diagnosis not present

## 2019-06-02 DIAGNOSIS — Z79891 Long term (current) use of opiate analgesic: Secondary | ICD-10-CM | POA: Diagnosis not present

## 2019-06-04 ENCOUNTER — Other Ambulatory Visit: Payer: Self-pay | Admitting: Internal Medicine

## 2019-06-15 DIAGNOSIS — H2513 Age-related nuclear cataract, bilateral: Secondary | ICD-10-CM | POA: Diagnosis not present

## 2019-06-15 DIAGNOSIS — H524 Presbyopia: Secondary | ICD-10-CM | POA: Diagnosis not present

## 2019-06-15 DIAGNOSIS — H401131 Primary open-angle glaucoma, bilateral, mild stage: Secondary | ICD-10-CM | POA: Diagnosis not present

## 2019-07-08 ENCOUNTER — Other Ambulatory Visit: Payer: Self-pay | Admitting: Internal Medicine

## 2019-07-21 ENCOUNTER — Ambulatory Visit: Payer: Medicare Other | Admitting: Internal Medicine

## 2019-07-28 DIAGNOSIS — Z79891 Long term (current) use of opiate analgesic: Secondary | ICD-10-CM | POA: Diagnosis not present

## 2019-07-28 DIAGNOSIS — K629 Disease of anus and rectum, unspecified: Secondary | ICD-10-CM | POA: Diagnosis not present

## 2019-07-28 DIAGNOSIS — G894 Chronic pain syndrome: Secondary | ICD-10-CM | POA: Diagnosis not present

## 2019-07-28 DIAGNOSIS — M545 Low back pain: Secondary | ICD-10-CM | POA: Diagnosis not present

## 2019-07-29 ENCOUNTER — Telehealth: Payer: Self-pay

## 2019-07-29 NOTE — Telephone Encounter (Signed)
Spoke with patient's wife and scheduled patient.

## 2019-08-11 ENCOUNTER — Ambulatory Visit (INDEPENDENT_AMBULATORY_CARE_PROVIDER_SITE_OTHER)
Admission: RE | Admit: 2019-08-11 | Discharge: 2019-08-11 | Disposition: A | Discharge status: Home / Self Care | Payer: Medicare Other | Source: Ambulatory Visit | Attending: Family Medicine | Admitting: Family Medicine

## 2019-08-11 ENCOUNTER — Encounter: Payer: Self-pay | Admitting: Family Medicine

## 2019-08-11 ENCOUNTER — Other Ambulatory Visit: Payer: Self-pay

## 2019-08-11 ENCOUNTER — Ambulatory Visit (INDEPENDENT_AMBULATORY_CARE_PROVIDER_SITE_OTHER): Payer: Medicare Other | Admitting: Family Medicine

## 2019-08-11 VITALS — BP 124/76 | HR 65 | Ht 74.0 in

## 2019-08-11 DIAGNOSIS — M25562 Pain in left knee: Secondary | ICD-10-CM

## 2019-08-11 DIAGNOSIS — G8929 Other chronic pain: Secondary | ICD-10-CM | POA: Diagnosis not present

## 2019-08-11 DIAGNOSIS — M25561 Pain in right knee: Secondary | ICD-10-CM

## 2019-08-11 DIAGNOSIS — M1710 Unilateral primary osteoarthritis, unspecified knee: Secondary | ICD-10-CM | POA: Diagnosis not present

## 2019-08-11 NOTE — Progress Notes (Signed)
[facility] [address] [place], [place] [address] Phone: [phone] Subjective:   Ryan Davila, am serving as a scribe for Dr. Hulan Saas.  I'm seeing this patient by the request  of:  Biagio Borg, MD   CC: Knee pain  [id number]  Ryan Davila is a 68 y.o. male coming in with complaint of right knee pain. Is not having pain today. Notes that he saw [place] 4 years go. Was told his "kneecap was not aligned and has bone spurs." Pain was present one month ago for 3 days but has since gone away. Was given an injection and uses a brace intermittently which helps the left.     Patient did have knee x-rays taken in December 2015.  Independently visualized by me showing mild degenerative changes in the knees bilaterally.  Past Medical History:  Diagnosis Date  . ALLERGIC RHINITIS 04/18/2008  . Allergy   . Blood transfusion without reported diagnosis    as infant  . Cancer [facility])    colon cancer  . COLON CANCER, HX OF 04/18/2008  . DEPRESSION 04/18/2008  . GERD 04/18/2008  . HEPATITIS B, HX OF 04/18/2008  . HYPERLIPIDEMIA 04/18/2008  . HYPERTENSION 04/18/2008  . Impaired glucose tolerance 04/23/2011   Past Surgical History:  Procedure Laterality Date  . APPENDECTOMY    . COLONOSCOPY    . COLOSTOMY    . 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 POLYPECTOMY    . RECTAL SURGERY  2003   AP resection  . REPAIR FLEXOR TENDON HAND    . Rotator cuff surgery    . TONSILLECTOMY     Social History   Socioeconomic History  . Marital status: Married    Spouse name: Not on file  . Number of children: Not on file  . Years of education: Not on file  . Highest education level: Not on file  Occupational History  . Not on file  Social Needs  . Financial resource strain: Not on file  . Food insecurity    Worry: Not on file    Inability: Not on file  . Transportation needs    Medical: Not on file    Non-medical: Not on file  Tobacco  Use  . Smoking status: Former Research scientist (life sciences)  . Smokeless tobacco: Former Network engineer and Sexual Activity  . Alcohol use: Yes    Comment: rare  . Drug use: Davila  . Sexual activity: Not on file  Lifestyle  . Physical activity    Days per week: Not on file    Minutes per session: Not on file  . Stress: Not on file  Relationships  . Social Herbalist on phone: Not on file    Gets together: Not on file    Attends religious service: Not on file    Active member of club or organization: Not on file    Attends meetings of clubs or organizations: Not on file    Relationship status: Not on file  Other Topics Concern  . Not on file  Social History Narrative  . Not on file   Davila Known Allergies Family History  Problem Relation Age of Onset  . Diabetes Father   . Diabetes Sister   . Breast cancer Maternal Aunt   . Prostate cancer Maternal Uncle   . Colon cancer Maternal Grandfather      Current Outpatient Medications (  Cardiovascular):  .  amLODipine-olmesartan (AZOR) 5-40 MG tablet, Take 1 tablet by mouth daily. .  carvedilol (COREG) 25 MG tablet, TAKE 1 TABLET (25 MG TOTAL) BY MOUTH 2 (TWO) TIMES DAILY WITH A MEAL. .  hydrALAZINE (APRESOLINE) 50 MG tablet, TAKE 1 TABLET BY MOUTH THREE TIMES A DAY .  lovastatin (MEVACOR) 40 MG tablet, TAKE 1 TABLET DAILY .  nitroGLYCERIN (NITROSTAT) 0.4 MG SL tablet, Place 1 tablet (0.4 mg total) under the tongue every 5 (five) minutes as needed for chest pain.  Current Outpatient Medications (Respiratory):  .  albuterol (PROAIR HFA) 108 (90 Base) MCG/ACT inhaler, TAKE 2 PUFFS BY MOUTH EVERY 6 HOURS AS NEEDED FOR WHEEZE  Current Outpatient Medications (Analgesics):  .  aspirin EC 81 MG tablet, Take by mouth daily.  Marland Kitchen  HYDROcodone-acetaminophen (VICODIN) 5-500 MG per tablet, Take 1 tablet by mouth every 8 (eight) hours as needed.  .  methadone (DOLOPHINE) 5 MG tablet, Take 5 mg by mouth daily. 5mg  taken 5 times a day. Total 25mg  per day.    Current Outpatient Medications (Other):  .  latanoprost (XALATAN) 0.005 % ophthalmic solution, Place 1 drop into both eyes at bedtime. 0.005 % .  linaclotide (LINZESS) 72 MCG capsule, Take 1 capsule (72 mcg total) by mouth daily as needed. .  mirtazapine (REMERON) 15 MG tablet, Take 1 tablet (15 mg total) by mouth at bedtime. .  ondansetron (ZOFRAN) 4 MG tablet, Take 1 tablet (4 mg total) by mouth every 8 (eight) hours as needed for nausea or vomiting. .  pantoprazole (PROTONIX) 40 MG tablet, Take 1 tablet (40 mg total) by mouth daily. .  tamsulosin (FLOMAX) 0.4 MG CAPS capsule, TAKE 1 CAPSULE DAILY .  triamcinolone cream (KENALOG) 0.1 %, Apply topically 2 (two) times daily.    Past medical history, social, surgical and family history all reviewed in electronic medical record.  Davila pertanent information unless stated regarding to the chief complaint.   Review of Systems:  Davila headache, visual changes, nausea, vomiting, diarrhea, constipation, dizziness, abdominal pain, skin rash, fevers, chills, night sweats, weight loss, swollen lymph nodes, body aches, joint swelling, muscle aches, chest pain, shortness of breath, mood changes.   Objective  Blood pressure 124/76, pulse 65, height 6\' 2"  (1.88 m), SpO2 96 %.   General: Davila apparent distress alert and oriented x3 mood and affect normal, dressed appropriately.  HEENT: Pupils equal, extraocular movements intact  Respiratory: Patient's speak in full sentences and does not appear short of breath  Cardiovascular: Davila lower extremity edema, non tender, Davila erythema  Skin: Warm dry intact with Davila signs of infection or rash on extremities or on axial skeleton.  Abdomen: Soft nontender  Neuro: Cranial nerves II through XII are intact, neurovascularly intact in all extremities with 2+ DTRs and 2+ pulses.  Lymph: Davila lymphadenopathy of posterior or anterior cervical chain or axillae bilaterally.  Gait normal with good balance and coordination.  MSK:  Non  tender with full range of motion and good stability and symmetric strength and tone of shoulders, elbows, wrist, hip, and ankles bilaterally.  Knee: Bilateral Normal to inspection with Davila erythema or effusion or obvious bony abnormalities. Tender to palpation more over the patellofemoral joint bilaterally right greater than left ROM full in flexion and extension and lower leg rotation. Ligaments with solid consistent endpoints including ACL, PCL, LCL, MCL. Negative Mcmurray's, Apley's, and Thessalonian tests. painful patellar compression severely on the right side. Patellar glide with moderate crepitus. Patellar and quadriceps tendons  unremarkable. Hamstring and quadriceps strength is normal.   Impression and Recommendations:     This case required medical decision making of moderate complexity. The above documentation has been reviewed and is accurate and complete Lyndal Pulley, DO       Note: This dictation was prepared with Dragon dictation along with smaller phrase technology. Any transcriptional errors that result from this process are unintentional.

## 2019-08-11 NOTE — Assessment & Plan Note (Signed)
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  Patient given Tru pull lite brace for the right side today.  Discussed over-the-counter medications, weight loss, home activities, strengthening the VMO, follow-up again in 4 to 8 weeks

## 2019-08-11 NOTE — Patient Instructions (Addendum)
Good to see you.  Ice 20 minutes 2 times daily. Usually after activity and before bed. Exercises 3 times a week.  Voltaren over the counter 2x daily Turmeric 500mg  daily  Tart cherry extract 1200mg  at night Vitamin D 2000 IU daily See me again in 6 weeks

## 2019-08-19 ENCOUNTER — Other Ambulatory Visit: Payer: Self-pay | Admitting: Internal Medicine

## 2019-09-22 ENCOUNTER — Ambulatory Visit: Payer: Medicare Other | Admitting: Family Medicine

## 2019-09-22 DIAGNOSIS — M545 Low back pain: Secondary | ICD-10-CM | POA: Diagnosis not present

## 2019-09-22 DIAGNOSIS — G894 Chronic pain syndrome: Secondary | ICD-10-CM | POA: Diagnosis not present

## 2019-09-22 DIAGNOSIS — Z79891 Long term (current) use of opiate analgesic: Secondary | ICD-10-CM | POA: Diagnosis not present

## 2019-09-22 DIAGNOSIS — K629 Disease of anus and rectum, unspecified: Secondary | ICD-10-CM | POA: Diagnosis not present

## 2019-10-14 DIAGNOSIS — H401131 Primary open-angle glaucoma, bilateral, mild stage: Secondary | ICD-10-CM | POA: Diagnosis not present

## 2019-10-20 ENCOUNTER — Other Ambulatory Visit: Payer: Self-pay | Admitting: Internal Medicine

## 2019-12-04 IMAGING — DX ABDOMEN - 2 VIEW
2 series · 2 of 2 positions shown · non-contrast
Comparison: None.

CLINICAL DATA: Constipation for 2 weeks.  Gas and bloating.

EXAM:
ABDOMEN - 2 VIEW

[abdomen erect]
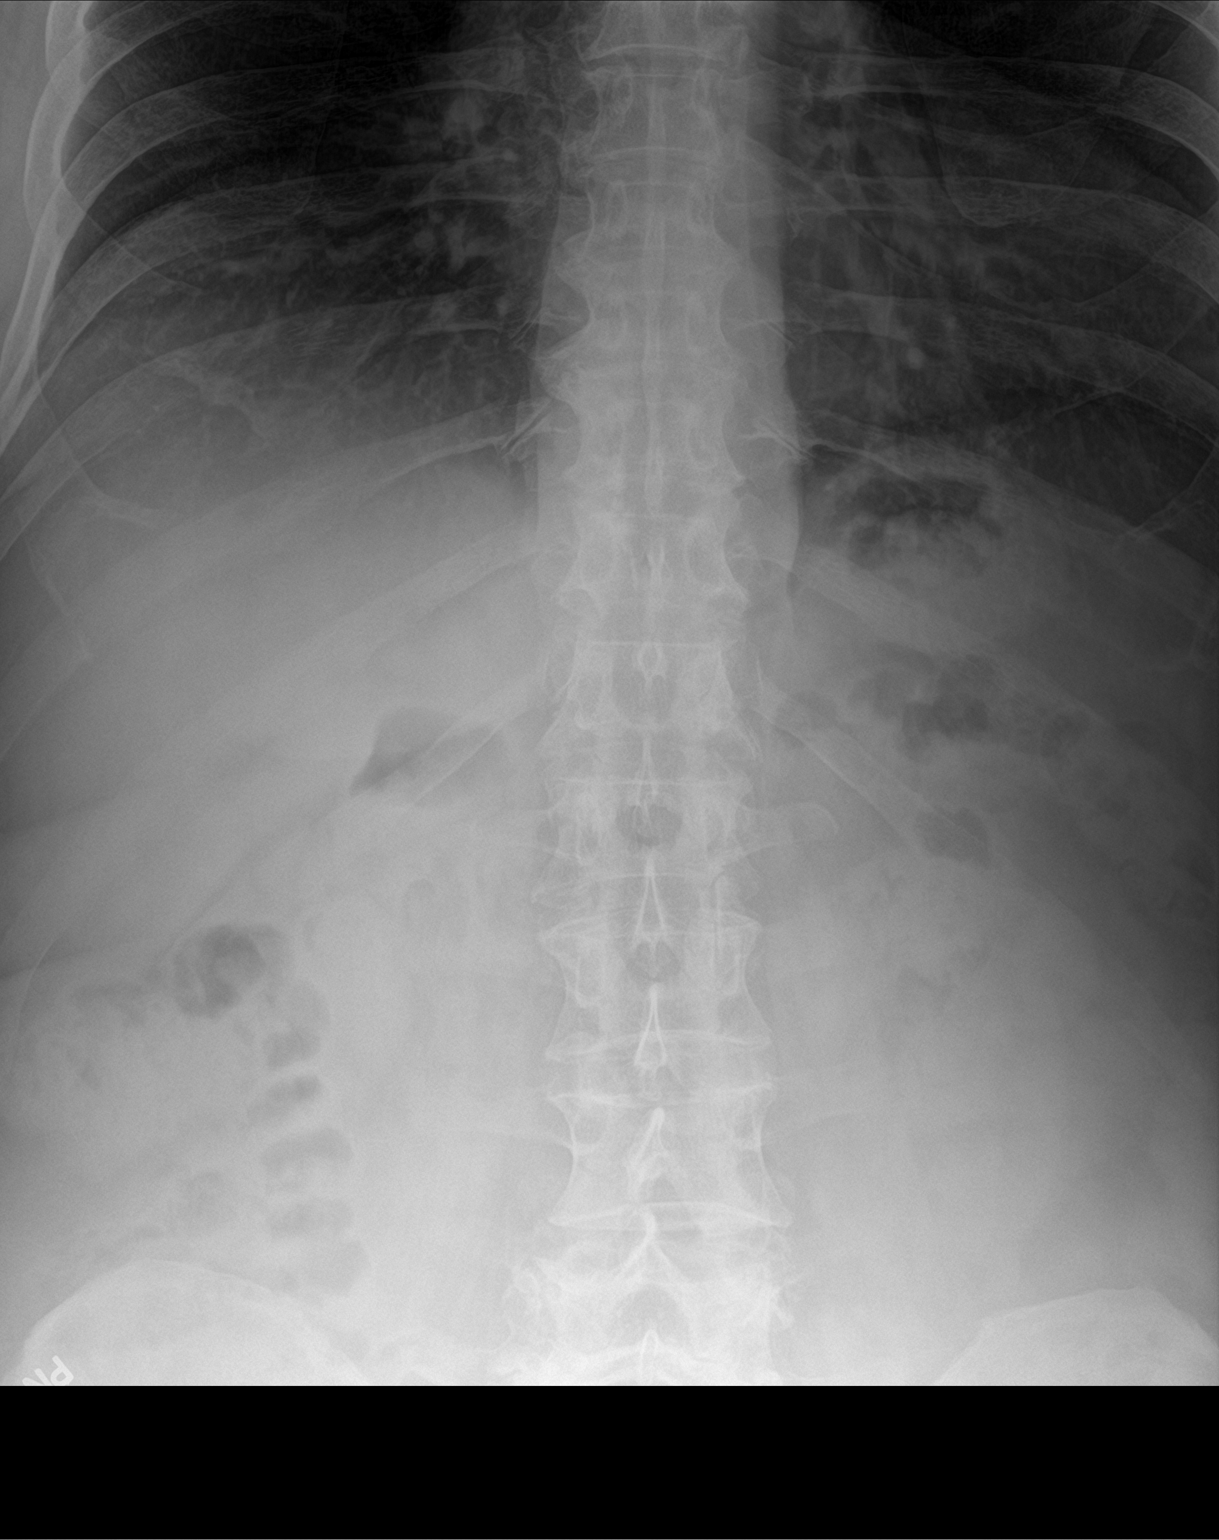

[abdomen supine]
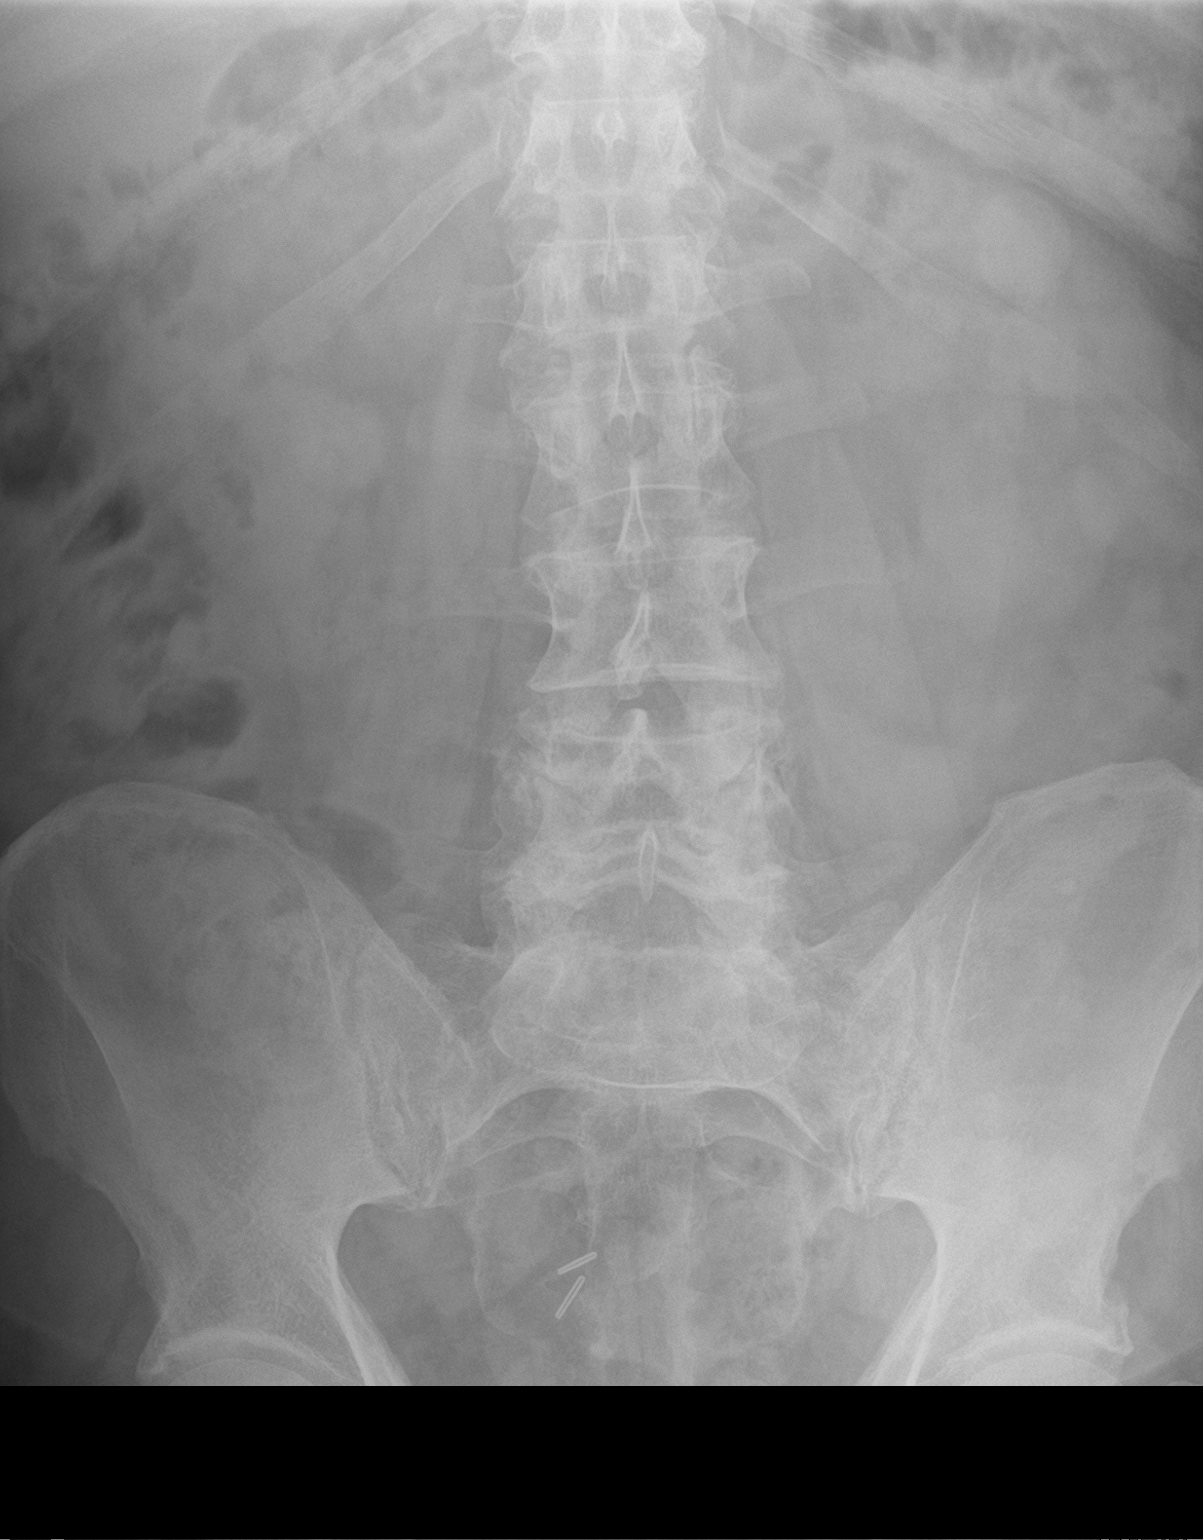

[2 of 2 positions shown; findings below may reference images not displayed]

FINDINGS: Lung bases are unremarkable. No free air, portal venous gas, or
pneumatosis. No stones in the kidneys or ureters. Surgical clips in
the right pelvis. No evidence of bowel obstruction. No significant
fecal loading.
IMPRESSION: No cause for the patient's symptoms identified. The fecal burden is
within normal limits.

## 2020-01-12 ENCOUNTER — Other Ambulatory Visit: Payer: Self-pay | Admitting: Internal Medicine

## 2020-01-19 ENCOUNTER — Other Ambulatory Visit: Payer: Self-pay | Admitting: Internal Medicine

## 2020-02-17 ENCOUNTER — Ambulatory Visit: Payer: Medicare Other | Attending: Internal Medicine

## 2020-04-19 ENCOUNTER — Other Ambulatory Visit: Payer: Self-pay | Admitting: Internal Medicine

## 2020-05-08 DIAGNOSIS — G894 Chronic pain syndrome: Secondary | ICD-10-CM | POA: Diagnosis not present

## 2020-05-08 DIAGNOSIS — K629 Disease of anus and rectum, unspecified: Secondary | ICD-10-CM | POA: Diagnosis not present

## 2020-05-08 DIAGNOSIS — M545 Low back pain: Secondary | ICD-10-CM | POA: Diagnosis not present

## 2020-05-08 DIAGNOSIS — Z79891 Long term (current) use of opiate analgesic: Secondary | ICD-10-CM | POA: Diagnosis not present

## 2020-05-08 IMAGING — DX BILATERAL KNEES STANDING - 1 VIEW
1 series · 1 of 1 positions shown · non-contrast
Comparison: 12/02/2014

CLINICAL DATA: Chronic knee pain

EXAM:
BILATERAL KNEES STANDING - 1 VIEW

[knee ap]
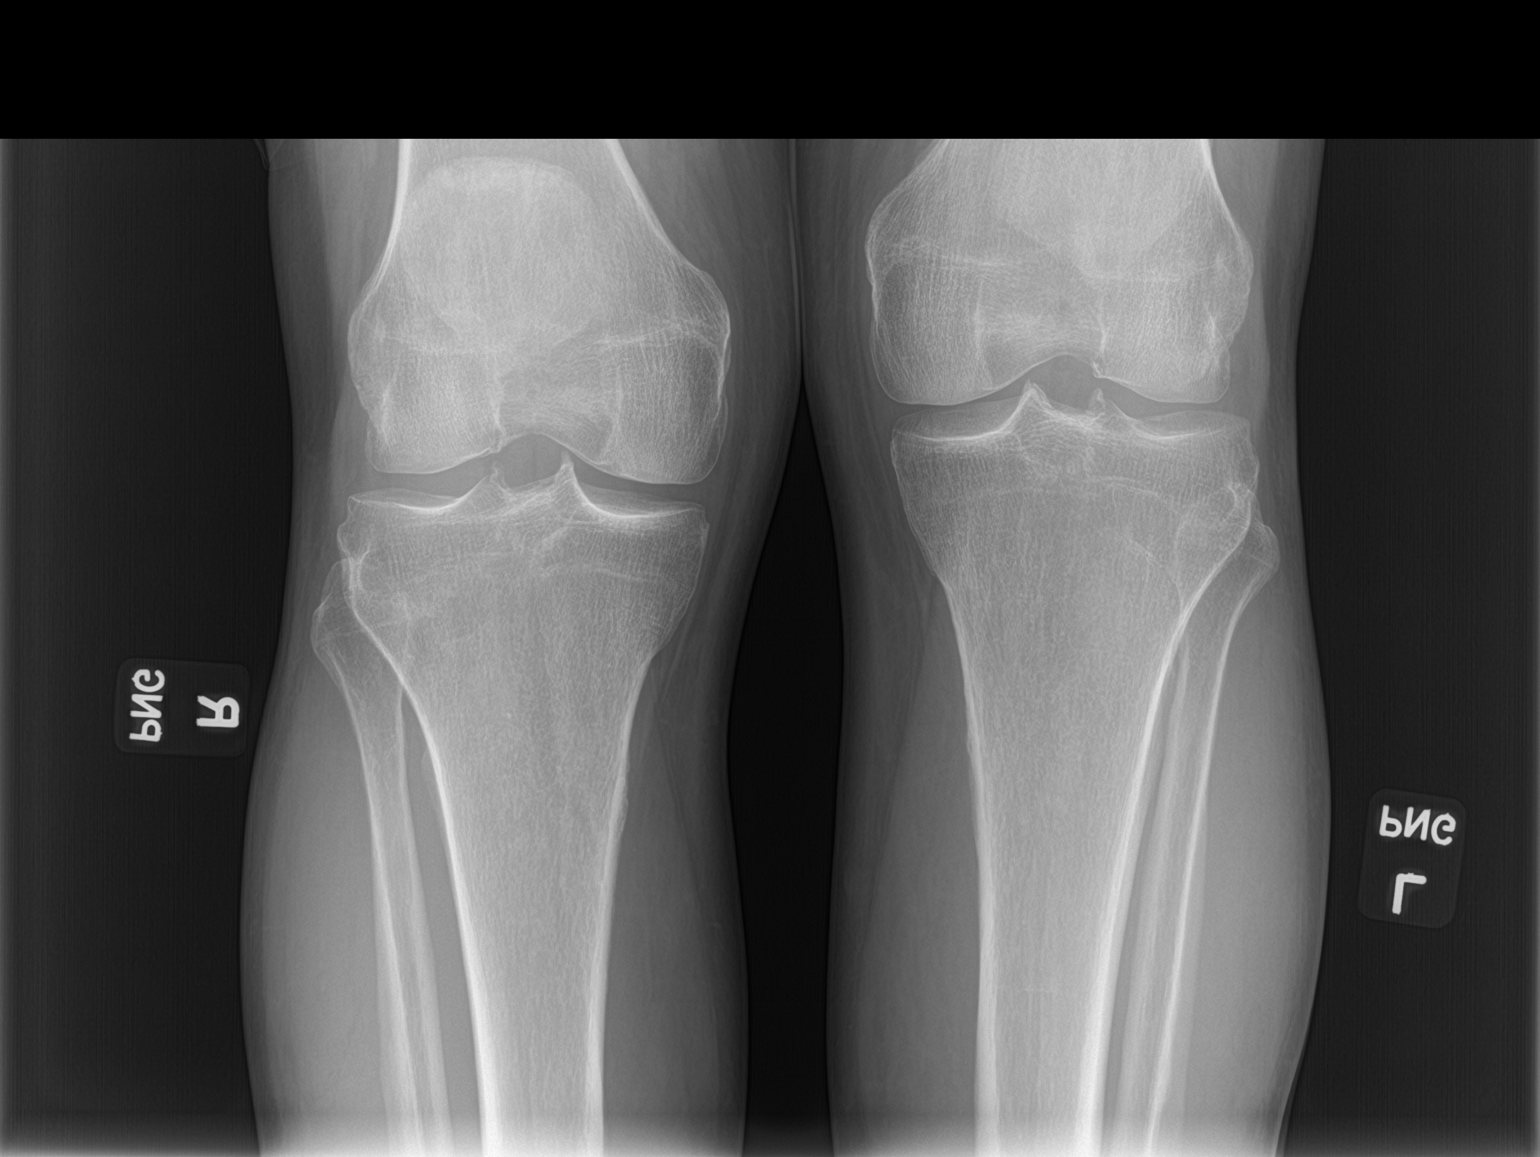

[1 of 1 positions shown; findings below may reference images not displayed]

FINDINGS: Early spurring along the tibial spines bilaterally. Joint spaces
maintained. No acute bony abnormality. Specifically, no fracture,
subluxation, or dislocation.
IMPRESSION: Early spurring along the tibial spines.  No acute bony abnormality.

## 2020-05-11 ENCOUNTER — Ambulatory Visit (INDEPENDENT_AMBULATORY_CARE_PROVIDER_SITE_OTHER): Payer: Medicare Other | Admitting: Internal Medicine

## 2020-05-11 ENCOUNTER — Other Ambulatory Visit: Payer: Self-pay

## 2020-05-11 ENCOUNTER — Encounter: Payer: Self-pay | Admitting: Internal Medicine

## 2020-05-11 VITALS — BP 124/70 | HR 76 | Temp 98.3°F | Ht 74.0 in | Wt 236.6 lb

## 2020-05-11 DIAGNOSIS — E785 Hyperlipidemia, unspecified: Secondary | ICD-10-CM | POA: Diagnosis not present

## 2020-05-11 DIAGNOSIS — I10 Essential (primary) hypertension: Secondary | ICD-10-CM | POA: Diagnosis not present

## 2020-05-11 DIAGNOSIS — N320 Bladder-neck obstruction: Secondary | ICD-10-CM | POA: Diagnosis not present

## 2020-05-11 DIAGNOSIS — Z85038 Personal history of other malignant neoplasm of large intestine: Secondary | ICD-10-CM

## 2020-05-11 DIAGNOSIS — E538 Deficiency of other specified B group vitamins: Secondary | ICD-10-CM

## 2020-05-11 DIAGNOSIS — R7302 Impaired glucose tolerance (oral): Secondary | ICD-10-CM | POA: Diagnosis not present

## 2020-05-11 DIAGNOSIS — E559 Vitamin D deficiency, unspecified: Secondary | ICD-10-CM | POA: Diagnosis not present

## 2020-05-11 MED ORDER — CARVEDILOL 25 MG PO TABS: 25.0000 mg | ORAL_TABLET | Freq: Two times a day (BID) | ORAL | 3 refills | Status: DC

## 2020-05-11 NOTE — Progress Notes (Signed)
Subjective:    Patient ID: URI ARCHILLA, male    DOB: 21-Mar-1951, 69 y.o.   MRN: [id number]  HPI  Here for yearly f/u;  Overall doing ok;  Pt denies Chest pain, worsening SOB, DOE, wheezing, orthopnea, PND, worsening LE edema, palpitations, dizziness or syncope.  Pt denies neurological change such as new headache, facial or extremity weakness.  Pt denies polydipsia, polyuria, or low sugar symptoms. Pt states overall good compliance with treatment and medications, good tolerability, and has been trying to follow appropriate diet.  Pt denies worsening depressive symptoms, suicidal ideation or panic. No fever, night sweats, wt loss, loss of appetite, or other constitutional symptoms.  Pt states good ability with ADL's, has low fall risk, home safety reviewed and adequate, no other significant changes in hearing or vision, and only occasionally active with exercise. No new complaints Past Medical History:  Diagnosis Date  . ALLERGIC RHINITIS 04/18/2008  . Allergy   . Blood transfusion without reported diagnosis    as infant  . Cancer [facility])    colon cancer  . COLON CANCER, HX OF 04/18/2008  . DEPRESSION 04/18/2008  . GERD 04/18/2008  . HEPATITIS B, HX OF 04/18/2008  . HYPERLIPIDEMIA 04/18/2008  . HYPERTENSION 04/18/2008  . Impaired glucose tolerance 04/23/2011   Past Surgical History:  Procedure Laterality Date  . APPENDECTOMY    . COLONOSCOPY    . COLOSTOMY    . 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 POLYPECTOMY    . RECTAL SURGERY  2003   AP resection  . REPAIR FLEXOR TENDON HAND    . Rotator cuff surgery    . TONSILLECTOMY      reports that he has quit smoking. He has quit using smokeless tobacco. He reports current alcohol use. He reports that he does not use drugs. family history includes Breast cancer in his maternal aunt; Colon cancer in his maternal grandfather; Diabetes in his father and sister; Prostate cancer in his maternal uncle. No Known Allergies Current  Outpatient Medications on File Prior to Visit  Medication Sig Dispense Refill  . albuterol (PROAIR HFA) 108 (90 Base) MCG/ACT inhaler TAKE 2 PUFFS BY MOUTH EVERY 6 HOURS AS NEEDED FOR WHEEZE 25.5 Inhaler 3  . amLODipine-olmesartan (AZOR) 5-40 MG tablet Take 1 tablet by mouth daily. 90 tablet 3  . aspirin EC 81 MG tablet Take by mouth daily.     . hydrALAZINE (APRESOLINE) 50 MG tablet TAKE 1 TABLET BY MOUTH THREE TIMES A DAY 270 tablet 0  . HYDROcodone-acetaminophen (VICODIN) 5-500 MG per tablet Take 1 tablet by mouth every 8 (eight) hours as needed.     . latanoprost (XALATAN) 0.005 % ophthalmic solution Place 1 drop into both eyes at bedtime. 0.005 %    . linaclotide (LINZESS) 72 MCG capsule Take 1 capsule (72 mcg total) by mouth daily as needed. 30 capsule 5  . lovastatin (MEVACOR) 40 MG tablet TAKE 1 TABLET DAILY 90 tablet 3  . methadone (DOLOPHINE) 5 MG tablet Take 5 mg by mouth daily. 5mg  taken 5 times a day. Total 25mg  per day.    . mirtazapine (REMERON) 15 MG tablet Take 1 tablet (15 mg total) by mouth at bedtime. 90 tablet 3  . ondansetron (ZOFRAN) 4 MG tablet Take 1 tablet (4 mg total) by mouth every 8 (eight) hours as needed for nausea or vomiting. 25 tablet 1  . pantoprazole (PROTONIX) 40 MG tablet Take 1 tablet (  40 mg total) by mouth daily. 90 tablet 3  . tamsulosin (FLOMAX) 0.4 MG CAPS capsule TAKE 1 CAPSULE BY MOUTH EVERY DAY. Annual appt due in March must see provider for future refills 90 capsule 0  . triamcinolone cream (KENALOG) 0.1 % Apply topically 2 (two) times daily. 30 g 0  . nitroGLYCERIN (NITROSTAT) 0.4 MG SL tablet Place 1 tablet (0.4 mg total) under the tongue every 5 (five) minutes as needed for chest pain. 40 tablet 5   No current facility-administered medications on file prior to visit.   Review of Systems All otherwise neg per pt     Objective:   Physical Exam BP 124/70 (BP Location: Left Arm, Patient Position: Sitting, Cuff Size: Large)   Pulse 76   Temp  98.3 F (36.8 C) (Oral)   Ht 6\' 2"  (1.88 m)   Wt 236 lb 9.6 oz (107.3 kg)   SpO2 96%   BMI 30.38 kg/m  VS noted,  Constitutional: Pt appears in NAD HENT: Head: NCAT.  Right Ear: External ear normal.  Left Ear: External ear normal.  Eyes: . Pupils are equal, round, and reactive to light. Conjunctivae and EOM are normal Nose: without d/c or deformity Neck: Neck supple. Gross normal ROM Cardiovascular: Normal rate and regular rhythm.   Pulmonary/Chest: Effort normal and breath sounds without rales or wheezing.  Abd:  Soft, NT, ND, + BS, no organomegaly Neurological: Pt is alert. At baseline orientation, motor grossly intact Skin: Skin is warm. No rashes, other new lesions, no LE edema Psychiatric: Pt behavior is normal without agitation  All otherwise neg per pt Lab Results  Component Value Date   WBC 9.8 03/01/2019   HGB 15.4 03/01/2019   HCT 44.8 03/01/2019   PLT 236.0 03/01/2019   GLUCOSE 159 (H) 03/01/2019   CHOL 178 07/17/2018   TRIG 248.0 (H) 07/17/2018   HDL 39.90 07/17/2018   LDLDIRECT 89.0 07/17/2018   LDLCALC 90 08/09/2013   ALT 17 03/01/2019   AST 14 03/01/2019   NA 138 03/01/2019   K 4.8 03/01/2019   CL 101 03/01/2019   CREATININE 1.13 03/01/2019   BUN 15 03/01/2019   CO2 29 03/01/2019   TSH 1.37 07/17/2018   PSA 0.39 07/17/2018   INR 0.84 02/22/2010   HGBA1C 6.4 03/01/2019      Assessment & Plan:

## 2020-05-11 NOTE — Assessment & Plan Note (Addendum)
stable overall by history and exam, recent data reviewed with pt, and pt to continue medical treatment as before,  to f/u any worsening symptoms or concerns  I spent 31 minutes in preparing to see the patient by review of recent labs, imaging and procedures, obtaining and reviewing separately obtained history, communicating with the patient and family or caregiver, ordering medications, tests or procedures, and documenting clinical information in the EHR including the differential Dx, treatment, and any further evaluation and other management of hyperglycemia, hld, htn, hx of colon ca

## 2020-05-11 NOTE — Assessment & Plan Note (Signed)
Asympt, for psa  

## 2020-05-11 NOTE — Assessment & Plan Note (Signed)
Due for f/u colonoscopy - will refer

## 2020-05-11 NOTE — Assessment & Plan Note (Signed)
stable overall by history and exam, recent data reviewed with pt, and pt to continue medical treatment as before,  to f/u any worsening symptoms or concerns  

## 2020-05-11 NOTE — Patient Instructions (Addendum)
You will be contacted regarding the referral for: colonoscopy  Please continue all other medications as before, and refills have been done if requested.  Please have the pharmacy call with any other refills you may need.  Please continue your efforts at being more active, low cholesterol diet, and weight control.  You are otherwise up to date with prevention measures today.  Please keep your appointments with your specialists as you may have planned  Please go to the LAB at the blood drawing area for the tests to be done - at the [facility] will be contacted by phone if any changes need to be made immediately.  Otherwise, you will receive a letter about your results with an explanation, but please check with MyChart first.  Please remember to sign up for MyChart if you have not done so, as this will be important to you in the future with finding out test results, communicating by private email, and scheduling acute appointments online when needed.  Please make an Appointment to return for your 1 year visit, or sooner if needed

## 2020-05-17 ENCOUNTER — Other Ambulatory Visit: Payer: Self-pay | Admitting: Internal Medicine

## 2020-05-22 ENCOUNTER — Other Ambulatory Visit (INDEPENDENT_AMBULATORY_CARE_PROVIDER_SITE_OTHER): Payer: Medicare Other

## 2020-05-22 ENCOUNTER — Other Ambulatory Visit: Payer: Self-pay | Admitting: Internal Medicine

## 2020-05-22 DIAGNOSIS — E785 Hyperlipidemia, unspecified: Secondary | ICD-10-CM

## 2020-05-22 DIAGNOSIS — N320 Bladder-neck obstruction: Secondary | ICD-10-CM | POA: Diagnosis not present

## 2020-05-22 DIAGNOSIS — E559 Vitamin D deficiency, unspecified: Secondary | ICD-10-CM | POA: Diagnosis not present

## 2020-05-22 DIAGNOSIS — E538 Deficiency of other specified B group vitamins: Secondary | ICD-10-CM

## 2020-05-22 DIAGNOSIS — R7302 Impaired glucose tolerance (oral): Secondary | ICD-10-CM | POA: Diagnosis not present

## 2020-05-22 DIAGNOSIS — D225 Melanocytic nevi of trunk: Secondary | ICD-10-CM | POA: Diagnosis not present

## 2020-05-22 DIAGNOSIS — S40861A Insect bite (nonvenomous) of right upper arm, initial encounter: Secondary | ICD-10-CM | POA: Diagnosis not present

## 2020-05-22 LAB — URINALYSIS, ROUTINE W REFLEX MICROSCOPIC
Hgb urine dipstick: NEGATIVE
Ketones, ur: NEGATIVE
Leukocytes,Ua: NEGATIVE
Nitrite: NEGATIVE
RBC / HPF: NONE SEEN (ref 0–?)
Specific Gravity, Urine: 1.025 (ref 1.000–1.030)
Urine Glucose: NEGATIVE
Urobilinogen, UA: 0.2 (ref 0.0–1.0)
pH: 5.5 (ref 5.0–8.0)

## 2020-05-22 LAB — BASIC METABOLIC PANEL
BUN: 15 mg/dL (ref 6–23)
CO2: 28 mEq/L (ref 19–32)
Calcium: 9.2 mg/dL (ref 8.4–10.5)
Chloride: 102 mEq/L (ref 96–112)
Creatinine, Ser: 1.03 mg/dL (ref 0.40–1.50)
GFR: 71.9 mL/min (ref >60.00)
Glucose, Bld: 158 mg/dL — ABNORMAL HIGH (ref 70–99)
Potassium: 4.2 mEq/L (ref 3.5–5.1)
Sodium: 138 mEq/L (ref 135–145)

## 2020-05-22 LAB — LIPID PANEL
Cholesterol: 161 mg/dL (ref 0–200)
HDL: 39.9 mg/dL (ref >39.00)
LDL Cholesterol: 82 mg/dL (ref 0–99)
NonHDL: 120.78
Total CHOL/HDL Ratio: 4
Triglycerides: 195 mg/dL — ABNORMAL HIGH (ref 0.0–149.0)
VLDL: 39 mg/dL (ref 0.0–40.0)

## 2020-05-22 LAB — CBC WITH DIFFERENTIAL/PLATELET
Basophils Absolute: 0.1 10*3/uL (ref 0.0–0.1)
Basophils Relative: 0.9 % (ref 0.0–3.0)
Eosinophils Absolute: 0.2 10*3/uL (ref 0.0–0.7)
Eosinophils Relative: 2.3 % (ref 0.0–5.0)
HCT: 45.4 % (ref 39.0–52.0)
Hemoglobin: 15.7 g/dL (ref 13.0–17.0)
Lymphocytes Relative: 23.8 % (ref 12.0–46.0)
Lymphs Abs: 2.2 10*3/uL (ref 0.7–4.0)
MCHC: 34.6 g/dL (ref 30.0–36.0)
MCV: 87.7 fl (ref 78.0–100.0)
Monocytes Absolute: 0.8 10*3/uL (ref 0.1–1.0)
Monocytes Relative: 8.5 % (ref 3.0–12.0)
Neutro Abs: 5.9 10*3/uL (ref 1.4–7.7)
Neutrophils Relative %: 64.5 % (ref 43.0–77.0)
Platelets: 239 10*3/uL (ref 150.0–400.0)
RBC: 5.18 Mil/uL (ref 4.22–5.81)
RDW: 13 % (ref 11.5–15.5)
WBC: 9.2 10*3/uL (ref 4.0–10.5)

## 2020-05-22 LAB — HEPATIC FUNCTION PANEL
ALT: 14 U/L (ref 0–53)
AST: 16 U/L (ref 0–37)
Albumin: 4.4 g/dL (ref 3.5–5.2)
Alkaline Phosphatase: 57 U/L (ref 39–117)
Bilirubin, Direct: 0.1 mg/dL (ref 0.0–0.3)
Total Bilirubin: 0.7 mg/dL (ref 0.2–1.2)
Total Protein: 6.8 g/dL (ref 6.0–8.3)

## 2020-05-22 LAB — HEMOGLOBIN A1C: Hgb A1c MFr Bld: 5.8 % (ref 4.6–6.5)

## 2020-05-22 LAB — TSH: TSH: 1.51 u[IU]/mL (ref 0.35–4.50)

## 2020-05-22 LAB — PSA: PSA: 0.35 ng/mL (ref 0.10–4.00)

## 2020-05-22 LAB — VITAMIN D 25 HYDROXY (VIT D DEFICIENCY, FRACTURES): VITD: 18.54 ng/mL — ABNORMAL LOW (ref 30.00–100.00)

## 2020-05-22 LAB — VITAMIN B12: Vitamin B-12: 193 pg/mL — ABNORMAL LOW (ref 211–911)

## 2020-05-22 MED ORDER — VITAMIN B-12 1000 MCG PO TABS
1000.0000 ug | ORAL_TABLET | Freq: Every day | ORAL | 1 refills | Status: DC
Start: 2020-05-22 — End: 2021-04-13

## 2020-05-22 MED ORDER — VITAMIN D (ERGOCALCIFEROL) 1.25 MG (50000 UNIT) PO CAPS: 50000.0000 [IU] | ORAL_CAPSULE | ORAL | 0 refills | Status: DC

## 2020-06-08 ENCOUNTER — Other Ambulatory Visit: Payer: Self-pay | Admitting: Internal Medicine

## 2020-06-08 NOTE — Telephone Encounter (Signed)
Please refill as per office routine med refill policy (all routine meds refilled for 3 mo or monthly per pt preference up to one year from last visit, then month to month grace period for 3 mo, then further med refills will have to be denied)  

## 2020-06-09 ENCOUNTER — Other Ambulatory Visit: Payer: Self-pay | Admitting: Internal Medicine

## 2020-06-09 NOTE — Telephone Encounter (Signed)
Please refill as per office routine med refill policy (all routine meds refilled for 3 mo or monthly per pt preference up to one year from last visit, then month to month grace period for 3 mo, then further med refills will have to be denied)  

## 2020-06-12 NOTE — Telephone Encounter (Signed)
New message:     1.Medication Requested: lovastatin (MEVACOR) 40 MG tablet 2. Pharmacy (Name, Street, [place]): [id number] [place], [place] - [address] 3. On Med List: yes  4. Last Visit with PCP: 05/11/20  5. Next visit date with PCP: none   Agent: Please be advised that RX refills may take up to 3 business days. We ask that you follow-up with your pharmacy.

## 2020-07-03 DIAGNOSIS — K629 Disease of anus and rectum, unspecified: Secondary | ICD-10-CM | POA: Diagnosis not present

## 2020-07-03 DIAGNOSIS — G894 Chronic pain syndrome: Secondary | ICD-10-CM | POA: Diagnosis not present

## 2020-07-03 DIAGNOSIS — M545 Low back pain: Secondary | ICD-10-CM | POA: Diagnosis not present

## 2020-07-03 DIAGNOSIS — Z79891 Long term (current) use of opiate analgesic: Secondary | ICD-10-CM | POA: Diagnosis not present

## 2020-08-09 ENCOUNTER — Other Ambulatory Visit: Payer: Self-pay | Admitting: Internal Medicine

## 2020-08-09 NOTE — Telephone Encounter (Signed)
Please refill as per office routine med refill policy (all routine meds refilled for 3 mo or monthly per pt preference up to one year from last visit, then month to month grace period for 3 mo, then further med refills will have to be denied)  

## 2020-08-12 ENCOUNTER — Other Ambulatory Visit: Payer: Self-pay | Admitting: Internal Medicine

## 2020-08-13 NOTE — Telephone Encounter (Signed)
Please refill as per office routine med refill policy (all routine meds refilled for 3 mo or monthly per pt preference up to one year from last visit, then month to month grace period for 3 mo, then further med refills will have to be denied)  

## 2020-08-18 ENCOUNTER — Other Ambulatory Visit: Payer: Self-pay | Admitting: Internal Medicine

## 2020-08-18 NOTE — Telephone Encounter (Signed)
Please change to OTC Vitamin D3 at 2000 units per day, indefinitely.  

## 2020-08-28 DIAGNOSIS — K629 Disease of anus and rectum, unspecified: Secondary | ICD-10-CM | POA: Diagnosis not present

## 2020-08-28 DIAGNOSIS — Z79891 Long term (current) use of opiate analgesic: Secondary | ICD-10-CM | POA: Diagnosis not present

## 2020-08-28 DIAGNOSIS — G894 Chronic pain syndrome: Secondary | ICD-10-CM | POA: Diagnosis not present

## 2020-08-28 DIAGNOSIS — M545 Low back pain: Secondary | ICD-10-CM | POA: Diagnosis not present

## 2020-09-04 ENCOUNTER — Other Ambulatory Visit: Payer: Self-pay | Admitting: Internal Medicine

## 2020-10-23 DIAGNOSIS — G894 Chronic pain syndrome: Secondary | ICD-10-CM | POA: Diagnosis not present

## 2020-10-23 DIAGNOSIS — K629 Disease of anus and rectum, unspecified: Secondary | ICD-10-CM | POA: Diagnosis not present

## 2020-10-23 DIAGNOSIS — M545 Low back pain, unspecified: Secondary | ICD-10-CM | POA: Diagnosis not present

## 2020-10-23 DIAGNOSIS — Z79891 Long term (current) use of opiate analgesic: Secondary | ICD-10-CM | POA: Diagnosis not present

## 2020-11-07 ENCOUNTER — Other Ambulatory Visit: Payer: Self-pay | Admitting: Internal Medicine

## 2020-11-30 ENCOUNTER — Other Ambulatory Visit: Payer: Self-pay | Admitting: Internal Medicine

## 2020-11-30 NOTE — Telephone Encounter (Signed)
Please refill as per office routine med refill policy (all routine meds refilled for 3 mo or monthly per pt preference up to one year from last visit, then month to month grace period for 3 mo, then further med refills will have to be denied)  

## 2020-12-13 ENCOUNTER — Other Ambulatory Visit: Payer: Self-pay | Admitting: Internal Medicine

## 2020-12-13 NOTE — Telephone Encounter (Signed)
Please refill as per office routine med refill policy (all routine meds refilled for 3 mo or monthly per pt preference up to one year from last visit, then month to month grace period for 3 mo, then further med refills will have to be denied)  

## 2021-02-13 DIAGNOSIS — M545 Low back pain, unspecified: Secondary | ICD-10-CM | POA: Diagnosis not present

## 2021-02-13 DIAGNOSIS — Z79891 Long term (current) use of opiate analgesic: Secondary | ICD-10-CM | POA: Diagnosis not present

## 2021-02-13 DIAGNOSIS — K629 Disease of anus and rectum, unspecified: Secondary | ICD-10-CM | POA: Diagnosis not present

## 2021-02-13 DIAGNOSIS — G894 Chronic pain syndrome: Secondary | ICD-10-CM | POA: Diagnosis not present

## 2021-04-10 ENCOUNTER — Other Ambulatory Visit: Payer: Self-pay | Admitting: Internal Medicine

## 2021-04-10 NOTE — Telephone Encounter (Signed)
Please refill as per office routine med refill policy (all routine meds refilled for 3 mo or monthly per pt preference up to one year from last visit, then month to month grace period for 3 mo, then further med refills will have to be denied)  

## 2021-04-11 DIAGNOSIS — G894 Chronic pain syndrome: Secondary | ICD-10-CM | POA: Diagnosis not present

## 2021-04-11 DIAGNOSIS — Z79891 Long term (current) use of opiate analgesic: Secondary | ICD-10-CM | POA: Diagnosis not present

## 2021-04-11 DIAGNOSIS — K629 Disease of anus and rectum, unspecified: Secondary | ICD-10-CM | POA: Diagnosis not present

## 2021-04-11 DIAGNOSIS — M545 Low back pain, unspecified: Secondary | ICD-10-CM | POA: Diagnosis not present

## 2021-04-13 ENCOUNTER — Ambulatory Visit (INDEPENDENT_AMBULATORY_CARE_PROVIDER_SITE_OTHER): Payer: Medicare Other | Admitting: Internal Medicine

## 2021-04-13 ENCOUNTER — Encounter: Payer: Self-pay | Admitting: Internal Medicine

## 2021-04-13 ENCOUNTER — Other Ambulatory Visit: Payer: Self-pay

## 2021-04-13 VITALS — BP 168/80 | HR 69 | Temp 98.0°F | Ht 74.0 in | Wt 225.0 lb

## 2021-04-13 DIAGNOSIS — E538 Deficiency of other specified B group vitamins: Secondary | ICD-10-CM | POA: Insufficient documentation

## 2021-04-13 DIAGNOSIS — I10 Essential (primary) hypertension: Secondary | ICD-10-CM | POA: Diagnosis not present

## 2021-04-13 DIAGNOSIS — C787 Secondary malignant neoplasm of liver and intrahepatic bile duct: Secondary | ICD-10-CM

## 2021-04-13 DIAGNOSIS — N320 Bladder-neck obstruction: Secondary | ICD-10-CM | POA: Diagnosis not present

## 2021-04-13 DIAGNOSIS — E7800 Pure hypercholesterolemia, unspecified: Secondary | ICD-10-CM

## 2021-04-13 DIAGNOSIS — R7302 Impaired glucose tolerance (oral): Secondary | ICD-10-CM | POA: Diagnosis not present

## 2021-04-13 DIAGNOSIS — Z23 Encounter for immunization: Secondary | ICD-10-CM | POA: Diagnosis not present

## 2021-04-13 DIAGNOSIS — E559 Vitamin D deficiency, unspecified: Secondary | ICD-10-CM | POA: Diagnosis not present

## 2021-04-13 DIAGNOSIS — Z85038 Personal history of other malignant neoplasm of large intestine: Secondary | ICD-10-CM

## 2021-04-13 LAB — LIPID PANEL
Cholesterol: 145 mg/dL (ref 0–200)
HDL: 46.9 mg/dL (ref >39.00)
LDL Cholesterol: 63 mg/dL (ref 0–99)
NonHDL: 97.73
Total CHOL/HDL Ratio: 3
Triglycerides: 172 mg/dL — ABNORMAL HIGH (ref 0.0–149.0)
VLDL: 34.4 mg/dL (ref 0.0–40.0)

## 2021-04-13 LAB — CBC WITH DIFFERENTIAL/PLATELET
Basophils Absolute: 0 10*3/uL (ref 0.0–0.1)
Basophils Relative: 0.5 % (ref 0.0–3.0)
Eosinophils Absolute: 0.2 10*3/uL (ref 0.0–0.7)
Eosinophils Relative: 1.9 % (ref 0.0–5.0)
HCT: 43.9 % (ref 39.0–52.0)
Hemoglobin: 15.1 g/dL (ref 13.0–17.0)
Lymphocytes Relative: 21.1 % (ref 12.0–46.0)
Lymphs Abs: 1.7 10*3/uL (ref 0.7–4.0)
MCHC: 34.3 g/dL (ref 30.0–36.0)
MCV: 88.6 fl (ref 78.0–100.0)
Monocytes Absolute: 0.7 10*3/uL (ref 0.1–1.0)
Monocytes Relative: 8.3 % (ref 3.0–12.0)
Neutro Abs: 5.5 10*3/uL (ref 1.4–7.7)
Neutrophils Relative %: 68.2 % (ref 43.0–77.0)
Platelets: 221 10*3/uL (ref 150.0–400.0)
RBC: 4.96 Mil/uL (ref 4.22–5.81)
RDW: 13.1 % (ref 11.5–15.5)
WBC: 8.1 10*3/uL (ref 4.0–10.5)

## 2021-04-13 LAB — BASIC METABOLIC PANEL
BUN: 15 mg/dL (ref 6–23)
CO2: 27 mEq/L (ref 19–32)
Calcium: 8.9 mg/dL (ref 8.4–10.5)
Chloride: 104 mEq/L (ref 96–112)
Creatinine, Ser: 0.84 mg/dL (ref 0.40–1.50)
GFR: 89.62 mL/min (ref >60.00)
Glucose, Bld: 117 mg/dL — ABNORMAL HIGH (ref 70–99)
Potassium: 4.1 mEq/L (ref 3.5–5.1)
Sodium: 139 mEq/L (ref 135–145)

## 2021-04-13 LAB — HEPATIC FUNCTION PANEL
ALT: 13 U/L (ref 0–53)
AST: 16 U/L (ref 0–37)
Albumin: 4.2 g/dL (ref 3.5–5.2)
Alkaline Phosphatase: 51 U/L (ref 39–117)
Bilirubin, Direct: 0.1 mg/dL (ref 0.0–0.3)
Total Bilirubin: 0.8 mg/dL (ref 0.2–1.2)
Total Protein: 6.8 g/dL (ref 6.0–8.3)

## 2021-04-13 LAB — URINALYSIS, ROUTINE W REFLEX MICROSCOPIC
Bilirubin Urine: NEGATIVE
Hgb urine dipstick: NEGATIVE
Ketones, ur: NEGATIVE
Leukocytes,Ua: NEGATIVE
Nitrite: NEGATIVE
RBC / HPF: NONE SEEN (ref 0–?)
Specific Gravity, Urine: >=1.03 — AB (ref 1.000–1.030)
Total Protein, Urine: NEGATIVE
Urine Glucose: NEGATIVE
Urobilinogen, UA: 0.2 (ref 0.0–1.0)
pH: 5.5 (ref 5.0–8.0)

## 2021-04-13 LAB — HEMOGLOBIN A1C: Hgb A1c MFr Bld: 5.7 % (ref 4.6–6.5)

## 2021-04-13 LAB — VITAMIN B12: Vitamin B-12: 249 pg/mL (ref 211–911)

## 2021-04-13 LAB — TSH: TSH: 1.1 u[IU]/mL (ref 0.35–4.50)

## 2021-04-13 LAB — PSA: PSA: 0.44 ng/mL (ref 0.10–4.00)

## 2021-04-13 LAB — VITAMIN D 25 HYDROXY (VIT D DEFICIENCY, FRACTURES): VITD: 27.06 ng/mL — ABNORMAL LOW (ref 30.00–100.00)

## 2021-04-13 MED ORDER — TAMSULOSIN HCL 0.4 MG PO CAPS: ORAL_CAPSULE | ORAL | 3 refills | Status: DC

## 2021-04-13 MED ORDER — HYDRALAZINE HCL 50 MG PO TABS
50.0000 mg | ORAL_TABLET | Freq: Three times a day (TID) | ORAL | 3 refills | Status: DC
Start: 2021-04-13 — End: 2022-03-05

## 2021-04-13 MED ORDER — LOVASTATIN 40 MG PO TABS: 40.0000 mg | ORAL_TABLET | Freq: Every day | ORAL | 3 refills | Status: DC

## 2021-04-13 MED ORDER — MIRTAZAPINE 15 MG PO TABS: 15.0000 mg | ORAL_TABLET | Freq: Every day | ORAL | 3 refills | Status: DC

## 2021-04-13 MED ORDER — ALBUTEROL SULFATE HFA 108 (90 BASE) MCG/ACT IN AERS: INHALATION_SPRAY | RESPIRATORY_TRACT | 3 refills | Status: DC

## 2021-04-13 MED ORDER — LINACLOTIDE 72 MCG PO CAPS: 72.0000 ug | ORAL_CAPSULE | Freq: Every day | ORAL | 5 refills | Status: DC | PRN

## 2021-04-13 MED ORDER — PANTOPRAZOLE SODIUM 40 MG PO TBEC: 40.0000 mg | DELAYED_RELEASE_TABLET | Freq: Every day | ORAL | 3 refills | Status: DC

## 2021-04-13 MED ORDER — AMLODIPINE-OLMESARTAN 5-40 MG PO TABS
1.0000 | ORAL_TABLET | Freq: Every day | ORAL | 3 refills | Status: DC
Start: 2021-04-13 — End: 2022-04-30

## 2021-04-13 MED ORDER — CARVEDILOL 25 MG PO TABS: 25.0000 mg | ORAL_TABLET | Freq: Two times a day (BID) | ORAL | 3 refills | Status: DC

## 2021-04-13 MED ORDER — VITAMIN B-12 1000 MCG PO TABS: 1000.0000 ug | ORAL_TABLET | Freq: Every day | ORAL | 1 refills | Status: DC

## 2021-04-13 NOTE — Patient Instructions (Signed)
Please take OTC Vitamin D3 at 2000 units per day, indefinitely.  Pleaese also take the B12 1000 mcg per day for at least 6 months  Please continue all other medications as before, and refills have been done if requested.  Please have the pharmacy call with any other refills you may need.  Please continue your efforts at being more active, low cholesterol diet, and weight control.  You are otherwise up to date with prevention measures today.  Please keep your appointments with your specialists as you may have planned  You will be contacted regarding the referral for: Gastroenterology  Please go to the LAB at the blood drawing area for the tests to be done  You will be contacted by phone if any changes need to be made immediately.  Otherwise, you will receive a letter about your results with an explanation, but please check with MyChart first.  Please remember to sign up for MyChart if you have not done so, as this will be important to you in the future with finding out test results, communicating by private email, and scheduling acute appointments online when needed.  Please make an Appointment to return for your 1 year visit, or sooner if needed

## 2021-04-13 NOTE — Progress Notes (Signed)
Patient ID: Ryan Davila, male   DOB: 02-16-1951, 70 y.o.   MRN: [id number]         Chief Complaint:: yearly exam       HPI:  Ryan Davila is a 70 y.o. male here with wife, overall doing quite well, due for Tdap and colonoscopy f/u, not taking Vit D or B12, and states BP at home is < 140/90, does not want med change today.  Pt denies chest pain, increased sob or doe, wheezing, orthopnea, PND, increased LE swelling, palpitations, dizziness or syncope.   Pt denies polydipsia, polyuria, or new focal neuro s/s.   Pt denies fever, wt loss, night sweats, loss of appetite, or other constitutional symptoms  No other new complaints.      Wt Readings from Last 3 Encounters:  04/13/21 225 lb (102.1 kg)  05/11/20 236 lb 9.6 oz (107.3 kg)  03/08/19 255 lb (115.7 kg)   BP Readings from Last 3 Encounters:  04/13/21 (!) 168/80  05/11/20 124/70  08/11/19 124/76   Immunization History  Administered Date(s) Administered  . Pneumococcal Conjugate-13 07/17/2018  . Td 08/24/2009  . Tdap 04/13/2021   Health Maintenance Due  Topic Date Due  . COLONOSCOPY (Pts 45-70yrs Insurance coverage will need to be confirmed)  09/24/2019     Past Medical History:  Diagnosis Date  . ALLERGIC RHINITIS 04/18/2008  . Allergy   . Blood transfusion without reported diagnosis    as infant  . Cancer [facility])    colon cancer  . COLON CANCER, HX OF 04/18/2008  . DEPRESSION 04/18/2008  . GERD 04/18/2008  . HEPATITIS B, HX OF 04/18/2008  . HYPERLIPIDEMIA 04/18/2008  . HYPERTENSION 04/18/2008  . Impaired glucose tolerance 04/23/2011   Past Surgical History:  Procedure Laterality Date  . APPENDECTOMY    . COLONOSCOPY    . COLOSTOMY    . 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 POLYPECTOMY    . RECTAL SURGERY  2003   AP resection  . REPAIR FLEXOR TENDON HAND    . Rotator cuff surgery    . TONSILLECTOMY      reports that he has quit smoking. He has quit using smokeless tobacco. He reports current  alcohol use. He reports that he does not use drugs. family history includes Breast cancer in his maternal aunt; Colon cancer in his maternal grandfather; Diabetes in his father and sister; Prostate cancer in his maternal uncle. No Known Allergies Current Outpatient Medications on File Prior to Visit  Medication Sig Dispense Refill  . aspirin EC 81 MG tablet Take by mouth daily.     Marland Kitchen HYDROcodone-acetaminophen (VICODIN) 5-500 MG per tablet Take 1 tablet by mouth every 8 (eight) hours as needed.     . latanoprost (XALATAN) 0.005 % ophthalmic solution Place 1 drop into both eyes at bedtime. 0.005 %    . methadone (DOLOPHINE) 5 MG tablet Take 5 mg by mouth daily. 5mg  taken 5 times a day. Total 25mg  per day.    . triamcinolone cream (KENALOG) 0.1 % Apply topically 2 (two) times daily. 30 g 0  . nitroGLYCERIN (NITROSTAT) 0.4 MG SL tablet Place 1 tablet (0.4 mg total) under the tongue every 5 (five) minutes as needed for chest pain. 40 tablet 5   No current facility-administered medications on file prior to visit.        ROS:  All others reviewed and negative.  Objective  PE:  BP (!) 168/80 (BP Location: Left Arm, Patient Position: Sitting, Cuff Size: Normal)   Pulse 69   Temp 98 F (36.7 C) (Oral)   Ht 6\' 2"  (1.88 m)   Wt 225 lb (102.1 kg)   SpO2 98%   BMI 28.89 kg/m                 Constitutional: Pt appears in NAD               HENT: Head: NCAT.                Right Ear: External ear normal.                 Left Ear: External ear normal.                Eyes: . Pupils are equal, round, and reactive to light. Conjunctivae and EOM are normal               Nose: without d/c or deformity               Neck: Neck supple. Gross normal ROM               Cardiovascular: Normal rate and regular rhythm.                 Pulmonary/Chest: Effort normal and breath sounds without rales or wheezing.                Abd:  Soft, NT, ND, + BS, no organomegaly               Neurological: Pt is  alert. At baseline orientation, motor grossly intact               Skin: Skin is warm. No rashes, no other new lesions, LE edema - none               Psychiatric: Pt behavior is normal without agitation   Micro: none  Cardiac tracings I have personally interpreted today:  none  Pertinent Radiological findings (summarize): none   Lab Results  Component Value Date   WBC 8.1 04/13/2021   HGB 15.1 04/13/2021   HCT 43.9 04/13/2021   PLT 221.0 04/13/2021   GLUCOSE 117 (H) 04/13/2021   CHOL 145 04/13/2021   TRIG 172.0 (H) 04/13/2021   HDL 46.90 04/13/2021   LDLDIRECT 89.0 07/17/2018   LDLCALC 63 04/13/2021   ALT 13 04/13/2021   AST 16 04/13/2021   NA 139 04/13/2021   K 4.1 04/13/2021   CL 104 04/13/2021   CREATININE 0.84 04/13/2021   BUN 15 04/13/2021   CO2 27 04/13/2021   TSH 1.10 04/13/2021   PSA 0.44 04/13/2021   INR 0.84 02/22/2010   HGBA1C 5.7 04/13/2021   Assessment/Plan:  Ryan Davila is a 71 y.o. White or Caucasian [1] male with  has a past medical history of ALLERGIC RHINITIS (04/18/2008), Allergy, Blood transfusion without reported diagnosis, Cancer ([place]), COLON CANCER, HX OF (04/18/2008), DEPRESSION (04/18/2008), GERD (04/18/2008), HEPATITIS B, HX OF (04/18/2008), HYPERLIPIDEMIA (04/18/2008), HYPERTENSION (04/18/2008), and Impaired glucose tolerance (04/23/2011).  Hx of colon cancer, stage IV Continue yearly [facility] oncology f/u  Hepatic metastasis ([place]) Without hepatic decompensation today,  to f/u any worsening symptoms or concerns  Impaired glucose tolerance Lab Results  Component Value Date   HGBA1C 5.7 04/13/2021   Stable, pt to continue current medical treatment  - diet   Hyperlipidemia Lab Results  Component Value Date   LDLCALC 63 04/13/2021   Stable, pt to continue current statin lovastatin 40  Essential hypertension BP Readings from Last 3 Encounters:  04/13/21 (!) 168/80  05/11/20 124/70  08/11/19 124/76   Stable, pt to continue medical treatment azor,  coreg, hydralazine   Bladder neck obstruction Also for psa  Vitamin D deficiency Last vitamin D Lab Results  Component Value Date   VD25OH 27.06 (L) 04/13/2021   Low , to start oral replacement   B12 deficiency Lab Results  Component Value Date   VITAMINB12 249 04/13/2021   Stable, cont oral replacement - b12 1000 mcg qd   Followup: Return in about 1 year (around 04/13/2022).  Cathlean Cower, MD 04/14/2021 5:19 AM [facility]

## 2021-04-14 ENCOUNTER — Encounter: Payer: Self-pay | Admitting: Internal Medicine

## 2021-04-14 NOTE — Assessment & Plan Note (Signed)
Without hepatic decompensation today,  to f/u any worsening symptoms or concerns

## 2021-04-14 NOTE — Assessment & Plan Note (Signed)
Lab Results  Component Value Date   LDLCALC 63 04/13/2021   Stable, pt to continue current statin lovastatin 40

## 2021-04-14 NOTE — Assessment & Plan Note (Signed)
Also for psa 

## 2021-04-14 NOTE — Assessment & Plan Note (Signed)
Lab Results  Component Value Date   HGBA1C 5.7 04/13/2021   Stable, pt to continue current medical treatment  - diet

## 2021-04-14 NOTE — Assessment & Plan Note (Signed)
BP Readings from Last 3 Encounters:  04/13/21 (!) 168/80  05/11/20 124/70  08/11/19 124/76   Stable, pt to continue medical treatment azor, coreg, hydralazine

## 2021-04-14 NOTE — Assessment & Plan Note (Signed)
Continue yearly [facility] f/u

## 2021-04-14 NOTE — Assessment & Plan Note (Signed)
Lab Results  Component Value Date   VITAMINB12 249 04/13/2021   Stable, cont oral replacement - b12 1000 mcg qd

## 2021-04-14 NOTE — Assessment & Plan Note (Signed)
Last vitamin D ?Lab Results  ?Component Value Date  ? VD25OH 27.06 (L) 04/13/2021  ? ?Low, to start oral replacement ? ?

## 2021-05-03 ENCOUNTER — Telehealth: Payer: Self-pay | Admitting: Internal Medicine

## 2021-05-03 NOTE — Telephone Encounter (Signed)
Patient does not want to go to [place], wondering if we can do a referral to Dr. Earlean Shawl at [place]

## 2021-05-03 NOTE — Telephone Encounter (Signed)
Faxed to Dr Earlean Shawl

## 2021-06-06 DIAGNOSIS — Z79891 Long term (current) use of opiate analgesic: Secondary | ICD-10-CM | POA: Diagnosis not present

## 2021-06-06 DIAGNOSIS — K629 Disease of anus and rectum, unspecified: Secondary | ICD-10-CM | POA: Diagnosis not present

## 2021-06-06 DIAGNOSIS — G894 Chronic pain syndrome: Secondary | ICD-10-CM | POA: Diagnosis not present

## 2021-06-13 ENCOUNTER — Emergency Department (HOSPITAL_BASED_OUTPATIENT_CLINIC_OR_DEPARTMENT_OTHER): Payer: Medicare Other

## 2021-06-13 ENCOUNTER — Emergency Department (HOSPITAL_BASED_OUTPATIENT_CLINIC_OR_DEPARTMENT_OTHER)
Admission: EM | Admit: 2021-06-13 | Discharge: 2021-06-13 | Disposition: A | Discharge status: Home / Self Care | Payer: Medicare Other | Attending: Emergency Medicine | Admitting: Emergency Medicine

## 2021-06-13 ENCOUNTER — Encounter (HOSPITAL_BASED_OUTPATIENT_CLINIC_OR_DEPARTMENT_OTHER): Payer: Self-pay

## 2021-06-13 ENCOUNTER — Other Ambulatory Visit: Payer: Self-pay

## 2021-06-13 DIAGNOSIS — Z87891 Personal history of nicotine dependence: Secondary | ICD-10-CM | POA: Diagnosis not present

## 2021-06-13 DIAGNOSIS — K56600 Partial intestinal obstruction, unspecified as to cause: Secondary | ICD-10-CM | POA: Diagnosis not present

## 2021-06-13 DIAGNOSIS — I10 Essential (primary) hypertension: Secondary | ICD-10-CM | POA: Insufficient documentation

## 2021-06-13 DIAGNOSIS — N281 Cyst of kidney, acquired: Secondary | ICD-10-CM | POA: Diagnosis not present

## 2021-06-13 DIAGNOSIS — Z79899 Other long term (current) drug therapy: Secondary | ICD-10-CM | POA: Diagnosis not present

## 2021-06-13 DIAGNOSIS — M545 Low back pain, unspecified: Secondary | ICD-10-CM

## 2021-06-13 DIAGNOSIS — M5459 Other low back pain: Secondary | ICD-10-CM | POA: Diagnosis not present

## 2021-06-13 DIAGNOSIS — Z85038 Personal history of other malignant neoplasm of large intestine: Secondary | ICD-10-CM | POA: Diagnosis not present

## 2021-06-13 DIAGNOSIS — J45909 Unspecified asthma, uncomplicated: Secondary | ICD-10-CM | POA: Insufficient documentation

## 2021-06-13 LAB — CBC WITH DIFFERENTIAL/PLATELET
Abs Immature Granulocytes: 0.02 10*3/uL (ref 0.00–0.07)
Basophils Absolute: 0.1 10*3/uL (ref 0.0–0.1)
Basophils Relative: 1 %
Eosinophils Absolute: 0.2 10*3/uL (ref 0.0–0.5)
Eosinophils Relative: 2 %
HCT: 44.2 % (ref 39.0–52.0)
Hemoglobin: 14.9 g/dL (ref 13.0–17.0)
Immature Granulocytes: 0 %
Lymphocytes Relative: 23 %
Lymphs Abs: 1.8 10*3/uL (ref 0.7–4.0)
MCH: 29.7 pg (ref 26.0–34.0)
MCHC: 33.7 g/dL (ref 30.0–36.0)
MCV: 88.2 fL (ref 80.0–100.0)
Monocytes Absolute: 0.6 10*3/uL (ref 0.1–1.0)
Monocytes Relative: 8 %
Neutro Abs: 4.9 10*3/uL (ref 1.7–7.7)
Neutrophils Relative %: 66 %
Platelets: 201 10*3/uL (ref 150–400)
RBC: 5.01 MIL/uL (ref 4.22–5.81)
RDW: 12.5 % (ref 11.5–15.5)
WBC: 7.6 10*3/uL (ref 4.0–10.5)
nRBC: 0 % (ref 0.0–0.2)

## 2021-06-13 LAB — BASIC METABOLIC PANEL
Anion gap: 9 (ref 5–15)
BUN: 14 mg/dL (ref 8–23)
CO2: 25 mmol/L (ref 22–32)
Calcium: 9.4 mg/dL (ref 8.9–10.3)
Chloride: 104 mmol/L (ref 98–111)
Creatinine, Ser: 0.91 mg/dL (ref 0.61–1.24)
GFR, Estimated: >60 mL/min (ref >60)
Glucose, Bld: 107 mg/dL — ABNORMAL HIGH (ref 70–99)
Potassium: 4 mmol/L (ref 3.5–5.1)
Sodium: 138 mmol/L (ref 135–145)

## 2021-06-13 LAB — URINALYSIS, ROUTINE W REFLEX MICROSCOPIC
Bilirubin Urine: NEGATIVE
Glucose, UA: NEGATIVE mg/dL
Hgb urine dipstick: NEGATIVE
Ketones, ur: NEGATIVE mg/dL
Leukocytes,Ua: NEGATIVE
Nitrite: NEGATIVE
Specific Gravity, Urine: 1.024 (ref 1.005–1.030)
pH: 6 (ref 5.0–8.0)

## 2021-06-13 MED ORDER — HYDROMORPHONE HCL 1 MG/ML IJ SOLN
1.0000 mg | Freq: Once | INTRAMUSCULAR | Status: AC
Start: 2021-06-13 — End: 2021-06-13
  Administered 2021-06-13: 1 mg via INTRAVENOUS
  Filled 2021-06-13: qty 1

## 2021-06-13 MED ORDER — CYCLOBENZAPRINE HCL 10 MG PO TABS: 10.0000 mg | ORAL_TABLET | Freq: Three times a day (TID) | ORAL | 0 refills | Status: AC | PRN

## 2021-06-13 NOTE — Discharge Instructions (Addendum)
You were seen in the emergency department for worsening low back pain.  You had lab work urinalysis and a CAT scan that did not show an obvious explanation for your pain.  Please contact your pain specialist regarding your pain regiment.  We are prescribing you some muscle relaxants which may help your symptoms.  It may also make you drowsy.  Included below is the report of the CAT scan.  Please discuss this with your primary care doctor.  IMPRESSION:  1. Small bowel feces sign in the terminal ileum. While this can be  associated with small bowel obstruction and other small bowel  abnormalities, there is currently no bowel dilatation and a similar  appearance was shown on the 10/23/2006 exam, indicating that this is  unlikely to be an acute issue.  2. Other imaging findings of potential clinical significance:  Partial left hepatectomy along with APR. No findings of recurrent  malignancy. Right mid kidney cyst. Aortic Atherosclerosis  (ICD10-I70.0).

## 2021-06-13 NOTE — ED Triage Notes (Signed)
Patient here POV from Home with Lower Back Pain.  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  Patient recalls no recent injury or trauma but states he may have "wrenched" it picking up an item 2-3 days PTA.  OTC Medication has not been effective at Home. No CP, No SOB.

## 2021-06-13 NOTE — ED Provider Notes (Signed)
[place] EMERGENCY DEPT Provider Note   CSN: [id number] Arrival date & time: 06/13/21  1916     History Chief Complaint  Patient presents with   Back Pain    Lower    Ryan Davila is a 70 y.o. male.  He has a history of colon cancer and is on chronic methadone.  He is complaining about a weeks worth of low back pain.  He has some chronic back pain but this is much more severe.  Worse with bending and twisting and walking.  Sometimes it radiates through to his abdomen.  Does not radiate down the legs.  No numbness or weakness.  No bowel or bladder symptoms.  No clear trauma although he says he does pick up things.  No fevers or chills.  The history is provided by the patient.  Back Pain Location:  Lumbar spine Quality:  Stabbing Radiates to: abdomen. Pain severity:  Severe Pain is:  Same all the time Onset quality:  Gradual Duration:  4 days Timing:  Constant Progression:  Unchanged Chronicity:  New Context: lifting heavy objects   Relieved by:  Being still Worsened by:  Movement and standing Ineffective treatments:  OTC medications Associated symptoms: no abdominal pain, no bladder incontinence, no bowel incontinence, no chest pain, no dysuria, no fever, no leg pain, no numbness and no weakness   Risk factors: hx of cancer       Past Medical History:  Diagnosis Date   ALLERGIC RHINITIS 04/18/2008   Allergy    Blood transfusion without reported diagnosis    as infant   Cancer ([place])    colon cancer   COLON CANCER, HX OF 04/18/2008   DEPRESSION 04/18/2008   GERD 04/18/2008   HEPATITIS B, HX OF 04/18/2008   HYPERLIPIDEMIA 04/18/2008   HYPERTENSION 04/18/2008   Impaired glucose tolerance 04/23/2011    Patient Active Problem List   Diagnosis Date Noted   Vitamin D deficiency 04/13/2021   B12 deficiency 04/13/2021   Nausea 03/01/2019   Anterior to posterior tear of superior glenoid labrum of left shoulder 12/24/2018   Pain in joint of left shoulder  12/24/2018   Left shoulder pain 11/24/2018   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/17/2018   Allergic contact dermatitis 06/19/2017   Infected tick bite of lower leg 06/19/2017   Left leg swelling 06/03/2017   Contact dermatitis 06/03/2017   Constipation 06/01/2015   Weight gain 04/07/2015   Increased abdominal girth 04/07/2015   Fatigue 04/07/2015   Skin lesion of left arm 04/07/2015   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   BPH (benign prostatic hypertrophy) 11/25/2014   Asthma 11/25/2014   Bilateral knee pain 11/25/2014   Chest pressure 02/04/2014   Hepatic metastasis ([place]) 02/02/2013   Skin lesion of scalp 08/06/2012   Bladder neck obstruction 08/06/2012   Impaired glucose tolerance 04/23/2011   Left foot pain 04/23/2011   HYPERSOMNIA 09/17/2010   Hyperlipidemia 04/18/2008   Depression 04/18/2008   Essential hypertension 04/18/2008   Allergic rhinitis 04/18/2008   GERD 04/18/2008   LOW BACK PAIN 04/18/2008   Hx of colon cancer, stage IV 04/18/2008   HEPATITIS B, HX OF 04/18/2008    Past Surgical History:  Procedure Laterality Date   APPENDECTOMY     COLONOSCOPY     COLOSTOMY     LIVER SURGERY     partial liver due to metastasis/ then CMT   POLYPECTOMY     RECTAL SURGERY  2003   AP resection   REPAIR FLEXOR  TENDON HAND     Rotator cuff surgery     TONSILLECTOMY         Family History  Problem Relation Age of Onset   Diabetes Father    Diabetes Sister    Breast cancer Maternal Aunt    Prostate cancer Maternal Uncle    Colon cancer Maternal Grandfather     Social History   Tobacco Use   Smoking status: Former    Pack years: 0.00   Smokeless tobacco: Former  Substance Use Topics   Alcohol use: Yes    Comment: rare   Drug use: No    Home Medications Prior to Admission medications   Medication Sig Start Date End Date Taking? Authorizing Provider  albuterol (VENTOLIN HFA) 108 (90 Base) MCG/ACT inhaler INHALE 2 PUFFS BY MOUTH EVERY 6 HOURS AS NEEDED FOR WHEEZE  04/13/21   Biagio Borg, MD  amLODipine-olmesartan (AZOR) 5-40 MG tablet Take 1 tablet by mouth daily. 04/13/21   Biagio Borg, MD  aspirin EC 81 MG tablet Take by mouth daily.     [provider]  carvedilol (COREG) 25 MG tablet Take 1 tablet (25 mg total) by mouth 2 (two) times daily with a meal. 04/13/21   Biagio Borg, MD  hydrALAZINE (APRESOLINE) 50 MG tablet Take 1 tablet (50 mg total) by mouth 3 (three) times daily. 04/13/21   Biagio Borg, MD  HYDROcodone-acetaminophen (VICODIN) 5-500 MG per tablet Take 1 tablet by mouth every 8 (eight) hours as needed.  12/18/09   [provider]  latanoprost (XALATAN) 0.005 % ophthalmic solution Place 1 drop into both eyes at bedtime. 0.005 % 02/05/11   [provider]  linaclotide (LINZESS) 72 MCG capsule Take 1 capsule (72 mcg total) by mouth daily as needed. 04/13/21   Biagio Borg, MD  lovastatin (MEVACOR) 40 MG tablet Take 1 tablet (40 mg total) by mouth daily. 04/13/21   Biagio Borg, MD  methadone (DOLOPHINE) 5 MG tablet Take 5 mg by mouth daily. 5mg  taken 5 times a day. Total 25mg  per day.    [provider]  mirtazapine (REMERON) 15 MG tablet Take 1 tablet (15 mg total) by mouth at bedtime. 04/13/21   Biagio Borg, MD  nitroGLYCERIN (NITROSTAT) 0.4 MG SL tablet Place 1 tablet (0.4 mg total) under the tongue every 5 (five) minutes as needed for chest pain. 06/03/17 06/03/18  Biagio Borg, MD  pantoprazole (PROTONIX) 40 MG tablet Take 1 tablet (40 mg total) by mouth daily. 04/13/21   Biagio Borg, MD  tamsulosin [facility]) 0.4 MG CAPS capsule 1 tab by mouth once daily 04/13/21   Biagio Borg, MD  triamcinolone cream (KENALOG) 0.1 % Apply topically 2 (two) times daily. 06/03/17   Biagio Borg, MD  vitamin B-12 (CYANOCOBALAMIN) 1000 MCG tablet Take 1 tablet (1,000 mcg total) by mouth daily. 04/13/21   Biagio Borg, MD    Allergies    Patient has no known allergies.  Review of Systems   Review of Systems   Constitutional:  Negative for fever.  HENT:  Negative for sore throat.   Eyes:  Negative for visual disturbance.  Respiratory:  Negative for shortness of breath.   Cardiovascular:  Negative for chest pain.  Gastrointestinal:  Negative for abdominal pain and bowel incontinence.  Genitourinary:  Negative for bladder incontinence and dysuria.  Musculoskeletal:  Positive for back pain and gait problem.  Skin:  Negative for rash.  Neurological:  Negative for weakness and numbness.   Physical Exam Updated Vital Signs BP (!) 144/99 (BP Location: Left Arm)   Pulse 86   Temp 98.3 F (36.8 C) (Oral)   Resp 16   Ht 6\' 2"  (1.88 m)   Wt 97.5 kg   SpO2 100%   BMI 27.60 kg/m   Physical Exam Vitals and nursing note reviewed.  Constitutional:      Appearance: Normal appearance. He is well-developed.  HENT:     Head: Normocephalic and atraumatic.  Eyes:     Conjunctiva/sclera: Conjunctivae normal.  Cardiovascular:     Rate and Rhythm: Normal rate and regular rhythm.     Heart sounds: No murmur heard. Pulmonary:     Effort: Pulmonary effort is normal. No respiratory distress.     Breath sounds: Normal breath sounds.  Abdominal:     Palpations: Abdomen is soft.     Tenderness: There is no abdominal tenderness. There is no guarding or rebound.  Musculoskeletal:        General: No deformity.     Cervical back: Neck supple.     Comments: He has no midline back tenderness.  He does have some right paralumbar tenderness and spasm.  Skin:    General: Skin is warm and dry.  Neurological:     General: No focal deficit present.     Mental Status: He is alert.     Sensory: No sensory deficit.     Motor: No weakness.    ED Results / Procedures / Treatments   Labs (all labs ordered are listed, but only abnormal results are displayed) Labs Reviewed  BASIC METABOLIC PANEL - Abnormal; Notable for the following components:      Result Value   Glucose, Bld 107 (*)    All other components  within normal limits  URINALYSIS, ROUTINE W REFLEX MICROSCOPIC - Abnormal; Notable for the following components:   Protein, ur TRACE (*)    All other components within normal limits  CBC WITH DIFFERENTIAL/PLATELET    EKG None  Radiology CT Renal Stone Study  Result Date: 06/13/2021 CLINICAL DATA:  Right back pain and flank pain. EXAM: CT ABDOMEN AND PELVIS WITHOUT CONTRAST TECHNIQUE: Multidetector CT imaging of the abdomen and pelvis was performed following the standard protocol without IV contrast. COMPARISON:  Multiple exams, including PET-CT from 10/23/2006 and abdominal ultrasound from 04/13/2015 FINDINGS: Lower chest: Unremarkable Hepatobiliary: Partial left hepatectomy. No recurrent lesion is appreciated along the resection site. Gallbladder unremarkable. No biliary dilatation. No new liver lesion. Pancreas: Unremarkable Spleen: Unremarkable Adrenals/Urinary Tract: Both adrenal glands appear normal. Exophytic 2.6 by 2.3 cm lesion of the right mid kidney previously shown to be a cyst on ultrasound. Stomach/Bowel: Prior APR with left lower quadrant end colostomy. Stool like appearance of the luminal contents of the terminal ileum. No dilated bowel. No significant abnormal bowel wall thickening is appreciated. Vascular/Lymphatic: Aortoiliac atherosclerotic vascular disease. Scattered mesenteric and pelvic lymph nodes are within normal size limits. Reproductive: Dystrophic calcifications in the prostate gland. Other: No supplemental non-categorized findings. Musculoskeletal: Grade 1 degenerative anterolisthesis at L4-5. IMPRESSION: 1. Small bowel feces sign in the terminal ileum. While this can be associated with small bowel obstruction and other small bowel abnormalities, there is currently no bowel dilatation and a similar appearance was shown on the 10/23/2006 exam, indicating that this is unlikely to be an acute issue. 2. Other imaging findings of potential clinical significance: Partial left  hepatectomy along with APR. No findings of recurrent malignancy.  Right mid kidney cyst. Aortic Atherosclerosis (ICD10-I70.0). Electronically Signed   By: Van Clines M.D.   On: 06/13/2021 21:25    Procedures Procedures   Medications Ordered in ED Medications  HYDROmorphone (DILAUDID) injection 1 mg (has no administration in time range)    ED Course  I have reviewed the triage vital signs and the nursing notes.  Pertinent labs & imaging results that were available during my care of the patient were reviewed by me and considered in my medical decision making (see chart for details).    MDM Rules/Calculators/A&P                         This patient complains of low back pain; this involves an extensive number of treatment Options and is a complaint that carries with it a high risk of complications and Morbidity. The differential includes musculoskeletal pain, disc disease, spinal fracture, metastatic disease, AAA, renal colic, pyelonephritis  I ordered, reviewed and interpreted labs, which included CBC with normal white count normal hemoglobin, chemistries normal, urinalysis unremarkable I ordered medication IV pain medication with improvement in his symptoms I ordered imaging studies which included CT renal and I independently    visualized and interpreted imaging which showed some fecalization of terminal ileum, no acute findings Previous records obtained and reviewed in epic, no recent admissions  After the interventions stated above, I reevaluated the patient and found patient's pain to be controlled.  Discussed because he has a pain contract he will need to talk to his provider about adjusting his pain management during this acute exacerbation of pain.  He is comfortable with this plan.   Final Clinical Impression(s) / ED Diagnoses Final diagnoses:  Acute right-sided low back pain without sciatica    Rx / DC Orders ED Discharge Orders          Ordered     cyclobenzaprine (FLEXERIL) 10 MG tablet  3 times daily PRN        06/13/21 2230             Hayden Rasmussen, MD 06/14/21 [phone]

## 2021-08-01 DIAGNOSIS — G894 Chronic pain syndrome: Secondary | ICD-10-CM | POA: Diagnosis not present

## 2021-08-01 DIAGNOSIS — K629 Disease of anus and rectum, unspecified: Secondary | ICD-10-CM | POA: Diagnosis not present

## 2021-08-01 DIAGNOSIS — M545 Low back pain, unspecified: Secondary | ICD-10-CM | POA: Diagnosis not present

## 2021-08-01 DIAGNOSIS — Z79891 Long term (current) use of opiate analgesic: Secondary | ICD-10-CM | POA: Diagnosis not present

## 2021-09-26 DIAGNOSIS — M545 Low back pain, unspecified: Secondary | ICD-10-CM | POA: Diagnosis not present

## 2021-09-26 DIAGNOSIS — K629 Disease of anus and rectum, unspecified: Secondary | ICD-10-CM | POA: Diagnosis not present

## 2021-09-26 DIAGNOSIS — Z79891 Long term (current) use of opiate analgesic: Secondary | ICD-10-CM | POA: Diagnosis not present

## 2021-09-26 DIAGNOSIS — G894 Chronic pain syndrome: Secondary | ICD-10-CM | POA: Diagnosis not present

## 2021-10-09 DIAGNOSIS — H25013 Cortical age-related cataract, bilateral: Secondary | ICD-10-CM | POA: Diagnosis not present

## 2021-10-09 DIAGNOSIS — H401131 Primary open-angle glaucoma, bilateral, mild stage: Secondary | ICD-10-CM | POA: Diagnosis not present

## 2021-10-09 DIAGNOSIS — H2513 Age-related nuclear cataract, bilateral: Secondary | ICD-10-CM | POA: Diagnosis not present

## 2021-11-21 DIAGNOSIS — Z79891 Long term (current) use of opiate analgesic: Secondary | ICD-10-CM | POA: Diagnosis not present

## 2021-11-21 DIAGNOSIS — K629 Disease of anus and rectum, unspecified: Secondary | ICD-10-CM | POA: Diagnosis not present

## 2021-11-21 DIAGNOSIS — M545 Low back pain, unspecified: Secondary | ICD-10-CM | POA: Diagnosis not present

## 2021-11-21 DIAGNOSIS — G894 Chronic pain syndrome: Secondary | ICD-10-CM | POA: Diagnosis not present

## 2022-01-23 ENCOUNTER — Other Ambulatory Visit: Payer: Self-pay

## 2022-01-23 ENCOUNTER — Ambulatory Visit
Admission: RE | Admit: 2022-01-23 | Discharge: 2022-01-23 | Disposition: A | Discharge status: Home / Self Care | Payer: Medicare Other | Source: Ambulatory Visit | Attending: Anesthesiology | Admitting: Anesthesiology

## 2022-01-23 ENCOUNTER — Other Ambulatory Visit: Payer: Self-pay | Admitting: Anesthesiology

## 2022-01-23 DIAGNOSIS — K629 Disease of anus and rectum, unspecified: Secondary | ICD-10-CM | POA: Diagnosis not present

## 2022-01-23 DIAGNOSIS — M25561 Pain in right knee: Secondary | ICD-10-CM

## 2022-01-23 DIAGNOSIS — G894 Chronic pain syndrome: Secondary | ICD-10-CM | POA: Diagnosis not present

## 2022-01-23 DIAGNOSIS — M545 Low back pain, unspecified: Secondary | ICD-10-CM | POA: Diagnosis not present

## 2022-01-23 DIAGNOSIS — Z79891 Long term (current) use of opiate analgesic: Secondary | ICD-10-CM | POA: Diagnosis not present

## 2022-02-08 ENCOUNTER — Other Ambulatory Visit: Payer: Self-pay | Admitting: Internal Medicine

## 2022-02-08 DIAGNOSIS — H401131 Primary open-angle glaucoma, bilateral, mild stage: Secondary | ICD-10-CM

## 2022-02-12 DIAGNOSIS — H401131 Primary open-angle glaucoma, bilateral, mild stage: Secondary | ICD-10-CM | POA: Diagnosis not present

## 2022-03-04 ENCOUNTER — Other Ambulatory Visit: Payer: Self-pay | Admitting: Internal Medicine

## 2022-03-04 NOTE — Telephone Encounter (Signed)
Please refill as per office routine med refill policy (all routine meds to be refilled for 3 mo or monthly (per pt preference) up to one year from last visit, then month to month grace period for 3 mo, then further med refills will have to be denied) ? ?

## 2022-03-20 DIAGNOSIS — G894 Chronic pain syndrome: Secondary | ICD-10-CM | POA: Diagnosis not present

## 2022-03-20 DIAGNOSIS — M545 Low back pain, unspecified: Secondary | ICD-10-CM | POA: Diagnosis not present

## 2022-03-20 DIAGNOSIS — Z79891 Long term (current) use of opiate analgesic: Secondary | ICD-10-CM | POA: Diagnosis not present

## 2022-03-20 DIAGNOSIS — K629 Disease of anus and rectum, unspecified: Secondary | ICD-10-CM | POA: Diagnosis not present

## 2022-04-15 ENCOUNTER — Ambulatory Visit: Payer: Medicare Other

## 2022-04-15 ENCOUNTER — Telehealth: Payer: Self-pay

## 2022-04-15 NOTE — Telephone Encounter (Signed)
Unsuccessful attempt to reach patient on preferred number listed in notes for scheduled AWV. Left message on voicemail okay to reschedule. 

## 2022-04-16 ENCOUNTER — Encounter: Payer: Self-pay | Admitting: Internal Medicine

## 2022-04-16 ENCOUNTER — Ambulatory Visit (INDEPENDENT_AMBULATORY_CARE_PROVIDER_SITE_OTHER): Payer: Medicare Other | Admitting: Internal Medicine

## 2022-04-16 VITALS — BP 130/82 | HR 108 | Temp 98.4°F | Ht 74.0 in | Wt 223.0 lb

## 2022-04-16 DIAGNOSIS — E7800 Pure hypercholesterolemia, unspecified: Secondary | ICD-10-CM | POA: Diagnosis not present

## 2022-04-16 DIAGNOSIS — Z85038 Personal history of other malignant neoplasm of large intestine: Secondary | ICD-10-CM | POA: Diagnosis not present

## 2022-04-16 DIAGNOSIS — E538 Deficiency of other specified B group vitamins: Secondary | ICD-10-CM | POA: Diagnosis not present

## 2022-04-16 DIAGNOSIS — N320 Bladder-neck obstruction: Secondary | ICD-10-CM

## 2022-04-16 DIAGNOSIS — I10 Essential (primary) hypertension: Secondary | ICD-10-CM | POA: Diagnosis not present

## 2022-04-16 DIAGNOSIS — R9431 Abnormal electrocardiogram [ECG] [EKG]: Secondary | ICD-10-CM | POA: Diagnosis not present

## 2022-04-16 DIAGNOSIS — E559 Vitamin D deficiency, unspecified: Secondary | ICD-10-CM

## 2022-04-16 DIAGNOSIS — R7302 Impaired glucose tolerance (oral): Secondary | ICD-10-CM | POA: Diagnosis not present

## 2022-04-16 LAB — URINALYSIS, ROUTINE W REFLEX MICROSCOPIC
Bilirubin Urine: NEGATIVE
Hgb urine dipstick: NEGATIVE
Ketones, ur: NEGATIVE
Leukocytes,Ua: NEGATIVE
Nitrite: NEGATIVE
RBC / HPF: NONE SEEN (ref 0–?)
Specific Gravity, Urine: >=1.03 — AB (ref 1.000–1.030)
Total Protein, Urine: NEGATIVE
Urine Glucose: NEGATIVE
Urobilinogen, UA: 0.2 (ref 0.0–1.0)
pH: 5.5 (ref 5.0–8.0)

## 2022-04-16 LAB — BASIC METABOLIC PANEL
BUN: 16 mg/dL (ref 6–23)
CO2: 25 mEq/L (ref 19–32)
Calcium: 9 mg/dL (ref 8.4–10.5)
Chloride: 101 mEq/L (ref 96–112)
Creatinine, Ser: 1.31 mg/dL (ref 0.40–1.50)
GFR: 55.54 mL/min — ABNORMAL LOW (ref >60.00)
Glucose, Bld: 123 mg/dL — ABNORMAL HIGH (ref 70–99)
Potassium: 3.9 mEq/L (ref 3.5–5.1)
Sodium: 136 mEq/L (ref 135–145)

## 2022-04-16 LAB — LIPID PANEL
Cholesterol: 177 mg/dL (ref 0–200)
HDL: 54.2 mg/dL (ref >39.00)
LDL Cholesterol: 88 mg/dL (ref 0–99)
NonHDL: 122.84
Total CHOL/HDL Ratio: 3
Triglycerides: 173 mg/dL — ABNORMAL HIGH (ref 0.0–149.0)
VLDL: 34.6 mg/dL (ref 0.0–40.0)

## 2022-04-16 LAB — TSH: TSH: 3.12 u[IU]/mL (ref 0.35–5.50)

## 2022-04-16 LAB — HEPATIC FUNCTION PANEL
ALT: 15 U/L (ref 0–53)
AST: 17 U/L (ref 0–37)
Albumin: 4.4 g/dL (ref 3.5–5.2)
Alkaline Phosphatase: 51 U/L (ref 39–117)
Bilirubin, Direct: 0.1 mg/dL (ref 0.0–0.3)
Total Bilirubin: 0.6 mg/dL (ref 0.2–1.2)
Total Protein: 7.1 g/dL (ref 6.0–8.3)

## 2022-04-16 LAB — CBC WITH DIFFERENTIAL/PLATELET
Basophils Absolute: 0.1 10*3/uL (ref 0.0–0.1)
Basophils Relative: 0.7 % (ref 0.0–3.0)
Eosinophils Absolute: 0.1 10*3/uL (ref 0.0–0.7)
Eosinophils Relative: 1.3 % (ref 0.0–5.0)
HCT: 44.4 % (ref 39.0–52.0)
Hemoglobin: 15.3 g/dL (ref 13.0–17.0)
Lymphocytes Relative: 21.4 % (ref 12.0–46.0)
Lymphs Abs: 2 10*3/uL (ref 0.7–4.0)
MCHC: 34.4 g/dL (ref 30.0–36.0)
MCV: 88.1 fl (ref 78.0–100.0)
Monocytes Absolute: 0.8 10*3/uL (ref 0.1–1.0)
Monocytes Relative: 8.3 % (ref 3.0–12.0)
Neutro Abs: 6.3 10*3/uL (ref 1.4–7.7)
Neutrophils Relative %: 68.3 % (ref 43.0–77.0)
Platelets: 234 10*3/uL (ref 150.0–400.0)
RBC: 5.05 Mil/uL (ref 4.22–5.81)
RDW: 12.6 % (ref 11.5–15.5)
WBC: 9.2 10*3/uL (ref 4.0–10.5)

## 2022-04-16 LAB — PSA: PSA: 0.38 ng/mL (ref 0.10–4.00)

## 2022-04-16 LAB — VITAMIN B12: Vitamin B-12: 251 pg/mL (ref 211–911)

## 2022-04-16 LAB — VITAMIN D 25 HYDROXY (VIT D DEFICIENCY, FRACTURES): VITD: 31.38 ng/mL (ref 30.00–100.00)

## 2022-04-16 LAB — HEMOGLOBIN A1C: Hgb A1c MFr Bld: 5.6 % (ref 4.6–6.5)

## 2022-04-16 NOTE — Progress Notes (Signed)
Patient ID: Ryan Davila, male   DOB: Jul 05, 1951, 71 y.o.   MRN: [id number] ? ? ? ?     Chief Complaint:: yearly exam ? ?     HPI:  Ryan Davila is a 71 y.o. male here overall ok.  Due for f/u colonoscopy, not taking Vit d.  Willing for Cardiac Ct scroe.  Trying to follow lower chol diet.  Pt denies chest pain, increased sob or doe, wheezing, orthopnea, PND, increased LE swelling, palpitations, dizziness or syncope.   Pt denies polydipsia, polyuria, or new focal neuro s/s.    Pt denies fever, wt loss, night sweats, loss of appetite, or other constitutional symptoms  No other new complaints ?Wt Readings from Last 3 Encounters:  ?04/16/22 223 lb (101.2 kg)  ?06/13/21 215 lb (97.5 kg)  ?04/13/21 225 lb (102.1 kg)  ? ?BP Readings from Last 3 Encounters:  ?04/16/22 130/82  ?06/13/21 (!) 178/83  ?04/13/21 (!) 168/80  ? ?Immunization History  ?Administered Date(s) Administered  ? Pneumococcal Conjugate-13 07/17/2018  ? Td 08/24/2009  ? Tdap 04/13/2021  ? ?Health Maintenance Due  ?Topic Date Due  ? COLONOSCOPY (Pts 45-47yr Insurance coverage will need to be confirmed)  09/24/2019  ? ?  ? ?Past Medical History:  ?Diagnosis Date  ? ALLERGIC RHINITIS 04/18/2008  ? Allergy   ? Blood transfusion without reported diagnosis   ? as infant  ? Cancer ([facility]   ? colon cancer  ? COLON CANCER, HX OF 04/18/2008  ? DEPRESSION 04/18/2008  ? GERD 04/18/2008  ? HEPATITIS B, HX OF 04/18/2008  ? HYPERLIPIDEMIA 04/18/2008  ? HYPERTENSION 04/18/2008  ? Impaired glucose tolerance 04/23/2011  ? ?Past Surgical History:  ?Procedure Laterality Date  ? APPENDECTOMY    ? COLONOSCOPY    ? COLOSTOMY    ? LIVER SURGERY    ? 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 POLYPECTOMY    ? RECTAL SURGERY  2003  ? AP resection  ? REPAIR FLEXOR TENDON HAND    ? Rotator cuff surgery    ? TONSILLECTOMY    ? ? reports that he has quit smoking. He has quit using smokeless tobacco. He reports current alcohol use. He reports that he does not use drugs. ?family history includes  Breast cancer in his maternal aunt; Colon cancer in his maternal grandfather; Diabetes in his father and sister; Prostate cancer in his maternal uncle. ?No Known Allergies ?Current Outpatient Medications on File Prior to Visit  ?Medication Sig Dispense Refill  ? albuterol (VENTOLIN HFA) 108 (90 Base) MCG/ACT inhaler INHALE 2 PUFFS BY MOUTH EVERY 6 HOURS AS NEEDED FOR WHEEZE 8.5 g 3  ? amLODipine-olmesartan (AZOR) 5-40 MG tablet Take 1 tablet by mouth daily. 90 tablet 3  ? cyclobenzaprine (FLEXERIL) 10 MG tablet Take 1 tablet (10 mg total) by mouth 3 (three) times daily as needed for muscle spasms. 15 tablet 0  ? hydrALAZINE (APRESOLINE) 50 MG tablet TAKE 1 TABLET BY MOUTH THREE TIMES A DAY 270 tablet 0  ? HYDROcodone-acetaminophen (VICODIN) 5-500 MG per tablet Take 1 tablet by mouth every 8 (eight) hours as needed.     ? latanoprost (XALATAN) 0.005 % ophthalmic solution Place 1 drop into both eyes at bedtime. 0.005 %    ? lovastatin (MEVACOR) 40 MG tablet Take 1 tablet (40 mg total) by mouth daily. 90 tablet 3  ? methadone (DOLOPHINE) 5 MG tablet Take 5 mg by mouth daily. '5mg'$  taken 5 times a day. Total '25mg'$   per day.    ? tamsulosin (FLOMAX) 0.4 MG CAPS capsule 1 tab by mouth once daily 90 capsule 3  ? vitamin B-12 (CYANOCOBALAMIN) 1000 MCG tablet Take 1 tablet (1,000 mcg total) by mouth daily. 90 tablet 1  ? aspirin EC 81 MG tablet Take by mouth daily.  (Patient not taking: Reported on 04/16/2022)    ? mirtazapine (REMERON) 15 MG tablet Take 1 tablet (15 mg total) by mouth at bedtime. (Patient not taking: Reported on 04/16/2022) 90 tablet 3  ? nitroGLYCERIN (NITROSTAT) 0.4 MG SL tablet Place 1 tablet (0.4 mg total) under the tongue every 5 (five) minutes as needed for chest pain. 40 tablet 5  ? triamcinolone cream (KENALOG) 0.1 % Apply topically 2 (two) times daily. (Patient not taking: Reported on 04/16/2022) 30 g 0  ? ?No current facility-administered medications on file prior to visit.  ? ?     ROS:  All others  reviewed and negative. ? ?Objective  ? ?     PE:  BP 130/82 (BP Location: Right Arm, Patient Position: Sitting, Cuff Size: Large)   Pulse (!) 108   Temp 98.4 ?F (36.9 ?C) (Oral)   Ht '6\' 2"'$  (1.88 m)   Wt 223 lb (101.2 kg)   SpO2 97%   BMI 28.63 kg/m?  ? ?              Constitutional: Pt appears in NAD ?              HENT: Head: NCAT.  ?              Right Ear: External ear normal.   ?              Left Ear: External ear normal.  ?              Eyes: . Pupils are equal, round, and reactive to light. Conjunctivae and EOM are normal ?              Nose: without d/c or deformity ?              Neck: Neck supple. Gross normal ROM ?              Cardiovascular: Normal rate and regular rhythm.   ?              Pulmonary/Chest: Effort normal and breath sounds without rales or wheezing.  ?              Abd:  Soft, NT, ND, + BS, no organomegaly ?              Neurological: Pt is alert. At baseline orientation, motor grossly intact ?              Skin: Skin is warm. No rashes, no other new lesions, LE edema - none ?              Psychiatric: Pt behavior is normal without agitation  ? ?Micro: none ? ?Cardiac tracings I have personally interpreted today:  none ? ?Pertinent Radiological findings (summarize): none  ? ?Lab Results  ?Component Value Date  ? WBC 9.2 04/16/2022  ? HGB 15.3 04/16/2022  ? HCT 44.4 04/16/2022  ? PLT 234.0 04/16/2022  ? GLUCOSE 123 (H) 04/16/2022  ? CHOL 177 04/16/2022  ? TRIG 173.0 (H) 04/16/2022  ? HDL 54.20 04/16/2022  ? LDLDIRECT 89.0 07/17/2018  ? [place] 88 04/16/2022  ? ALT 15 04/16/2022  ?  AST 17 04/16/2022  ? NA 136 04/16/2022  ? K 3.9 04/16/2022  ? CL 101 04/16/2022  ? CREATININE 1.31 04/16/2022  ? BUN 16 04/16/2022  ? CO2 25 04/16/2022  ? TSH 3.12 04/16/2022  ? PSA 0.38 04/16/2022  ? INR 0.84 02/22/2010  ? HGBA1C 5.6 04/16/2022  ? ?Assessment/Plan:  ?Ryan Davila is a 71 y.o. White or Caucasian [1] male with  has a past medical history of ALLERGIC RHINITIS (04/18/2008), Allergy, Blood  transfusion without reported diagnosis, Cancer ([place]), COLON CANCER, HX OF (04/18/2008), DEPRESSION (04/18/2008), GERD (04/18/2008), HEPATITIS B, HX OF (04/18/2008), HYPERLIPIDEMIA (04/18/2008), HYPERTENSION (04/18/2008), and Impaired glucose tolerance (04/23/2011). ? ?Vitamin D deficiency ?Last vitamin D ?Lab Results  ?Component Value Date  ? VD25OH 27.06 (L) 04/13/2021  ? ?Low, to start oral replacement ? ? ?Hx of colon cancer, stage IV ?For f/u colonoscopy as is due ? ?Hyperlipidemia ?Lab Results  ?Component Value Date  ? [place] 88 04/16/2022  ? ?Stable, pt to continue current statin lovastatin ? ? ?Essential hypertension ?BP Readings from Last 3 Encounters:  ?04/16/22 130/82  ?06/13/21 (!) 178/83  ?04/13/21 (!) 168/80  ? ?Stable, pt to continue medical treatment azor, hydralzine ? ? ?Impaired glucose tolerance ?Lab Results  ?Component Value Date  ? HGBA1C 5.6 04/16/2022  ? ?Stable, pt to continue current medical treatment - diet ? ? ?B12 deficiency ?Lab Results  ?Component Value Date  ? HYQMVHQI69 251 04/16/2022  ? ?Low, to start oral replacement - b12 1000 mcg qd ? ?Followup: Return in about 6 months (around 10/17/2022). ? ?Cathlean Cower, MD 04/23/2022 8:50 PM ?[place] ?[place] ?Internal Medicine ?

## 2022-04-16 NOTE — Patient Instructions (Addendum)
You will be contacted regarding the referral for: colonoscopy ? ?Please take OTC Vitamin D3 at 2000 units per day, indefinitely ? ?We have discussed the Cardiac CT Score test to measure the calcification level (if any) in your heart arteries.  This test has been ordered in our [place], so please call  CT directly, as they prefer this, at [phone] to be scheduled. ? ?Please continue all other medications as before, and refills have been done if requested. ? ?Please have the pharmacy call with any other refills you may need. ? ?Please continue your efforts at being more active, low cholesterol diet, and weight control. ? ?You are otherwise up to date with prevention measures today. ? ?Please keep your appointments with your specialists as you may have planned ? ?Please go to the LAB at the blood drawing area for the tests to be done ? ?You will be contacted by phone if any changes need to be made immediately.  Otherwise, you will receive a letter about your results with an explanation, but please check with MyChart first. ? ?Please remember to sign up for MyChart if you have not done so, as this will be important to you in the future with finding out test results, communicating by private email, and scheduling acute appointments online when needed. ? ?Please make an Appointment to return in 6 months, or sooner if needed ?

## 2022-04-16 NOTE — Assessment & Plan Note (Signed)
Last vitamin D ?Lab Results  ?Component Value Date  ? VD25OH 27.06 (L) 04/13/2021  ? ?Low, to start oral replacement ? ?

## 2022-04-23 ENCOUNTER — Encounter: Payer: Self-pay | Admitting: Internal Medicine

## 2022-04-23 NOTE — Assessment & Plan Note (Signed)
Lab Results  Component Value Date   VITAMINB12 251 04/16/2022   Low, to start  oral replacement - b12 1000 mcg qd  

## 2022-04-23 NOTE — Assessment & Plan Note (Signed)
Lab Results  ?Component Value Date  ? HGBA1C 5.6 04/16/2022  ? ?Stable, pt to continue current medical treatment - diet ? ?

## 2022-04-23 NOTE — Assessment & Plan Note (Signed)
Lab Results  ?Component Value Date  ? [place] 88 04/16/2022  ? ?Stable, pt to continue current statin lovastatin ? ?

## 2022-04-23 NOTE — Assessment & Plan Note (Signed)
For f/u colonoscopy as is due ?

## 2022-04-23 NOTE — Assessment & Plan Note (Signed)
BP Readings from Last 3 Encounters:  ?04/16/22 130/82  ?06/13/21 (!) 178/83  ?04/13/21 (!) 168/80  ? ?Stable, pt to continue medical treatment azor, hydralzine ? ?

## 2022-04-29 ENCOUNTER — Telehealth: Payer: Self-pay | Admitting: Gastroenterology

## 2022-04-29 ENCOUNTER — Other Ambulatory Visit: Payer: Self-pay | Admitting: Internal Medicine

## 2022-04-29 NOTE — Telephone Encounter (Signed)
All routine except for protonix please ?

## 2022-04-29 NOTE — Telephone Encounter (Signed)
Good afternoon Dr. Candis Schatz, ? ?This patient's wife called to schedule this patient's colonoscopy.  He was a former patient of Dr. Deatra Ina and had gone to [facility] because he had colon cancer.  This was several years ago (records in [place]).  He is now cancer free, is not having any GI issues, is not on blood thinners, is not diabetic, and has not gone back to [facility] since his cancer treatment was over.  He would like to transfer his care back here as it is more convenient for him.   ? ?Please let me know if you approve the transfer.  ? ?Thank you. ?

## 2022-05-01 DIAGNOSIS — H401131 Primary open-angle glaucoma, bilateral, mild stage: Secondary | ICD-10-CM | POA: Diagnosis not present

## 2022-05-16 ENCOUNTER — Other Ambulatory Visit: Payer: Self-pay

## 2022-05-16 ENCOUNTER — Ambulatory Visit (AMBULATORY_SURGERY_CENTER): Payer: Self-pay

## 2022-05-16 VITALS — Ht 74.0 in | Wt 226.2 lb

## 2022-05-16 DIAGNOSIS — G894 Chronic pain syndrome: Secondary | ICD-10-CM | POA: Diagnosis not present

## 2022-05-16 DIAGNOSIS — Z85038 Personal history of other malignant neoplasm of large intestine: Secondary | ICD-10-CM

## 2022-05-16 DIAGNOSIS — Z79891 Long term (current) use of opiate analgesic: Secondary | ICD-10-CM | POA: Diagnosis not present

## 2022-05-16 DIAGNOSIS — M545 Low back pain, unspecified: Secondary | ICD-10-CM | POA: Diagnosis not present

## 2022-05-16 DIAGNOSIS — K629 Disease of anus and rectum, unspecified: Secondary | ICD-10-CM | POA: Diagnosis not present

## 2022-05-16 MED ORDER — PEG 3350-KCL-NA BICARB-NACL 420 G PO SOLR: 4000.0000 mL | Freq: Once | ORAL | 0 refills | Status: AC

## 2022-05-16 NOTE — Progress Notes (Signed)
Denies allergies to eggs or soy products. Denies complication of anesthesia or sedation. Denies use of weight loss medication. Denies use of O2.   Emmi instructions given for colonoscopy.  

## 2022-06-05 ENCOUNTER — Other Ambulatory Visit: Payer: Self-pay | Admitting: Internal Medicine

## 2022-06-05 ENCOUNTER — Ambulatory Visit (INDEPENDENT_AMBULATORY_CARE_PROVIDER_SITE_OTHER)
Admission: RE | Admit: 2022-06-05 | Discharge: 2022-06-05 | Disposition: A | Discharge status: Home / Self Care | Payer: Self-pay | Source: Ambulatory Visit | Attending: Internal Medicine | Admitting: Internal Medicine

## 2022-06-05 DIAGNOSIS — I10 Essential (primary) hypertension: Secondary | ICD-10-CM

## 2022-06-05 DIAGNOSIS — R7302 Impaired glucose tolerance (oral): Secondary | ICD-10-CM

## 2022-06-05 DIAGNOSIS — E7800 Pure hypercholesterolemia, unspecified: Secondary | ICD-10-CM

## 2022-06-05 DIAGNOSIS — R9431 Abnormal electrocardiogram [ECG] [EKG]: Secondary | ICD-10-CM

## 2022-06-05 MED ORDER — ROSUVASTATIN CALCIUM 40 MG PO TABS: 40.0000 mg | ORAL_TABLET | Freq: Every day | ORAL | 3 refills | Status: DC

## 2022-06-05 MED ORDER — ASPIRIN 81 MG PO TBEC: 81.0000 mg | DELAYED_RELEASE_TABLET | Freq: Every day | ORAL | 12 refills | Status: DC

## 2022-06-06 ENCOUNTER — Encounter: Payer: Medicare Other | Admitting: Gastroenterology

## 2022-06-11 ENCOUNTER — Encounter: Payer: Self-pay | Admitting: Gastroenterology

## 2022-06-11 ENCOUNTER — Ambulatory Visit (AMBULATORY_SURGERY_CENTER): Payer: Medicare Other | Admitting: Gastroenterology

## 2022-06-11 VITALS — BP 145/86 | HR 81 | Temp 97.3°F | Resp 17 | Ht 74.0 in | Wt 226.2 lb

## 2022-06-11 DIAGNOSIS — D120 Benign neoplasm of cecum: Secondary | ICD-10-CM | POA: Diagnosis not present

## 2022-06-11 DIAGNOSIS — Z85038 Personal history of other malignant neoplasm of large intestine: Secondary | ICD-10-CM

## 2022-06-11 DIAGNOSIS — Z08 Encounter for follow-up examination after completed treatment for malignant neoplasm: Secondary | ICD-10-CM

## 2022-06-11 DIAGNOSIS — D122 Benign neoplasm of ascending colon: Secondary | ICD-10-CM | POA: Diagnosis not present

## 2022-06-11 MED ORDER — SODIUM CHLORIDE 0.9 % IV SOLN: 500.0000 mL | Freq: Once | INTRAVENOUS | Status: DC

## 2022-06-11 NOTE — Progress Notes (Signed)
Pt. Reports no change in his medical or surgical history since his pre-visit 05/16/2022.

## 2022-06-11 NOTE — Progress Notes (Signed)
[facility] History and Physical   Primary Care Physician:  Corwin Levins, MD   Reason for Procedure:   Colon cancer surveillance  Plan:    Surveillance colonoscopy     HPI: Ryan Davila is a 71 y.o. male undergoing surveillance colonoscopy.  He has a history of rectal cancer in 2003 s/p APR with end colostomy.  His last colonoscopy was in 2015 and 2 polyps were removed.   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 Authorizing Provider  amLODipine-olmesartan (AZOR) 5-40 MG tablet TAKE 1 TABLET BY MOUTH EVERY DAY 04/30/22  Yes Corwin Levins, MD  cyclobenzaprine (FLEXERIL) 10 MG tablet Take 1 tablet (10 mg total) by mouth 3 (three) times daily as needed for muscle spasms. 06/13/21  Yes Terrilee Files, MD  hydrALAZINE (APRESOLINE) 50 MG tablet TAKE 1 TABLET BY MOUTH THREE TIMES A DAY 03/05/22  Yes Corwin Levins, MD  latanoprost (XALATAN) 0.005 % ophthalmic solution Place 1 drop into both eyes at bedtime. 0.005 % 02/05/11  Yes [provider]  methadone (DOLOPHINE) 5 MG tablet Take 5 mg by mouth daily. 5mg  taken 5 times a day. Total 25mg  per day.   Yes [provider]  rosuvastatin (CRESTOR) 40 MG  tablet Take 1 tablet (40 mg total) by mouth daily. 06/05/22  Yes Corwin Levins, MD  tamsulosin (FLOMAX) 0.4 MG CAPS capsule TAKE 1 CAPSULE BY MOUTH EVERY DAY 04/30/22  Yes Corwin Levins, MD  vitamin B-12 (CYANOCOBALAMIN) 1000 MCG tablet Take 1 tablet (1,000 mcg total) by mouth daily. 04/13/21  Yes Corwin Levins, MD  albuterol (VENTOLIN HFA) 108 (90 Base) MCG/ACT inhaler INHALE 2 PUFFS BY MOUTH EVERY 6 HOURS AS NEEDED FOR WHEEZE Patient not taking: Reported on 06/11/2022 04/13/21   Corwin Levins, MD  aspirin EC 81 MG tablet Take 1 tablet (81 mg total) by mouth daily. Swallow whole. Patient not taking: Reported on 06/11/2022 06/05/22   Corwin Levins, MD  HYDROcodone-acetaminophen (VICODIN) 5-500 MG per tablet Take 1 tablet by mouth every 8 (eight) hours as needed.  Patient not taking: Reported on 06/11/2022 12/18/09   [provider]  lovastatin (MEVACOR) 40 MG tablet     [provider]  nitroGLYCERIN (NITROSTAT) 0.4 MG SL tablet Place 1 tablet (0.4 mg total) under the tongue every 5 (five) minutes as needed for chest pain. 06/03/17 06/03/18  Corwin Levins, MD  triamcinolone cream (KENALOG) 0.1 % Apply topically 2 (two) times daily.  Patient not taking: Reported on 06/11/2022 06/03/17   Corwin Levins, MD    Current Outpatient Medications  Medication Sig Dispense Refill   amLODipine-olmesartan (AZOR) 5-40 MG tablet TAKE 1 TABLET BY MOUTH EVERY DAY 90 tablet 3   cyclobenzaprine (FLEXERIL) 10 MG tablet Take 1 tablet (10 mg total) by mouth 3 (three) times daily as needed for muscle spasms. 15 tablet 0   hydrALAZINE (APRESOLINE) 50 MG tablet TAKE 1 TABLET BY MOUTH THREE TIMES A DAY 270 tablet 0   latanoprost (XALATAN) 0.005 % ophthalmic solution Place 1 drop into both eyes at bedtime. 0.005 %     methadone (DOLOPHINE) 5 MG tablet Take 5 mg by mouth daily. 5mg  taken 5 times a day. Total 25mg  per day.     rosuvastatin (CRESTOR) 40 MG tablet Take 1 tablet (40 mg total) by mouth daily. 90 tablet 3    tamsulosin (FLOMAX) 0.4 MG CAPS capsule TAKE 1 CAPSULE BY MOUTH EVERY DAY 90 capsule 3   vitamin B-12 (CYANOCOBALAMIN) 1000 MCG tablet Take 1 tablet (1,000 mcg total) by mouth daily. 90 tablet 1   albuterol (VENTOLIN HFA) 108 (90 Base) MCG/ACT inhaler INHALE 2 PUFFS BY MOUTH EVERY 6 HOURS AS NEEDED FOR WHEEZE (Patient not taking: Reported on 06/11/2022) 8.5 g 3   aspirin EC 81 MG tablet Take 1 tablet (81 mg total) by mouth daily. Swallow whole. (Patient not taking: Reported on 06/11/2022) 30 tablet 12   HYDROcodone-acetaminophen (VICODIN) 5-500 MG per tablet Take 1 tablet by mouth every 8 (eight) hours as needed.  (Patient not taking: Reported on 06/11/2022)     lovastatin (MEVACOR) 40 MG tablet      nitroGLYCERIN (NITROSTAT) 0.4 MG SL tablet Place 1 tablet (0.4 mg total) under the tongue every 5 (five) minutes as needed for chest pain. 40 tablet 5   triamcinolone cream (KENALOG) 0.1 % Apply topically 2 (two) times daily. (Patient not taking: Reported on 06/11/2022) 30 g 0   Current Facility-Administered Medications  Medication Dose Route Frequency Provider Last Rate Last Admin   0.9 %  sodium chloride infusion  500 mL Intravenous Once Jenel Lucks, MD        Allergies as of 06/11/2022   (No Known Allergies)    Family History  Problem Relation Age of Onset   Diabetes Father    Diabetes Sister    Breast cancer Maternal Aunt    Prostate cancer Maternal Uncle    Colon cancer Maternal Grandfather    Esophageal cancer Neg Hx    Stomach cancer Neg Hx    Rectal cancer Neg Hx     Social History   Socioeconomic History   Marital status: Married    Spouse name: Not on file   Number of children: Not on file   Years of education: Not on file   Highest education level: Not on file  Occupational History   Not on file  Tobacco Use   Smoking status: Former   Smokeless tobacco: Former  Substance and Sexual Activity   Alcohol use: Yes    Comment: rare   Drug use: No   Sexual activity:  Not on file  Other Topics Concern   Not on file  Social History Narrative   Not on file   Social Determinants of Health   Financial Resource Strain: Not on file  Food Insecurity: Not on file  Transportation Needs: Not on file  Physical Activity: Not on file  Stress: Not on file  Social  Connections: Not on file  Intimate Partner Violence: Not on file    Review of Systems:  All other review of systems negative except as mentioned in the HPI.  Physical Exam: Vital signs BP (!) 140/91   Pulse (!) 118   Temp (!) 97.3 F (36.3 C)   Ht 6\' 2"  (1.88 m)   Wt 226 lb 3.2 oz (102.6 kg)   SpO2 96%   BMI 29.04 kg/m   General:   Alert,  Well-developed, well-nourished, pleasant and cooperative in NAD Airway:  Mallampati 2 Lungs:  Clear throughout to auscultation.   Heart:  Regular rate and rhythm; no murmurs, clicks, rubs,  or gallops. Abdomen:  Soft, nontender and nondistended. Normal bowel sounds. Ostomy bag in LLQ  Neuro/Psych:  Normal mood and affect. A and O x 3   Naresh Althaus E. Tomasa Rand, MD [facility]

## 2022-06-11 NOTE — Progress Notes (Signed)
Called to room to assist during endoscopic procedure.  Patient ID and intended procedure confirmed with present staff. Received instructions for my participation in the procedure from the performing physician.  

## 2022-06-12 ENCOUNTER — Telehealth: Payer: Self-pay

## 2022-06-12 NOTE — Telephone Encounter (Signed)
  Follow up Call-     06/11/2022    7:43 AM  Call back number  Post procedure Call Back phone  # [phone]  Permission to leave phone message Yes     Patient questions:  Do you have a fever, pain , or abdominal swelling? No. Pain Score  0 *  Have you tolerated food without any problems? Yes.    Have you been able to return to your normal activities? Yes.    Do you have any questions about your discharge instructions: Diet   No. Medications  No. Follow up visit  No.  Do you have questions or concerns about your Care? No.  Actions: * If pain score is 4 or above: No action needed, pain <4.

## 2022-06-13 NOTE — Progress Notes (Signed)
Mr. Linck,   The polyps that I removed during your recent procedure were completely benign but were proven to be "pre-cancerous" polyps that MAY have grown into cancers if they had not been removed.  Studies shows that at least 20% of women over age 70 and 30% of men over age 73 have pre-cancerous polyps.  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  Based on current nationally recognized surveillance guidelines, I recommend that you have a repeat colonoscopy in 3 years.

## 2022-07-02 DIAGNOSIS — M25561 Pain in right knee: Secondary | ICD-10-CM | POA: Diagnosis not present

## 2022-07-02 DIAGNOSIS — M170 Bilateral primary osteoarthritis of knee: Secondary | ICD-10-CM | POA: Diagnosis not present

## 2022-07-11 DIAGNOSIS — G894 Chronic pain syndrome: Secondary | ICD-10-CM | POA: Diagnosis not present

## 2022-07-11 DIAGNOSIS — Z79891 Long term (current) use of opiate analgesic: Secondary | ICD-10-CM | POA: Diagnosis not present

## 2022-07-11 DIAGNOSIS — K629 Disease of anus and rectum, unspecified: Secondary | ICD-10-CM | POA: Diagnosis not present

## 2022-07-11 DIAGNOSIS — M545 Low back pain, unspecified: Secondary | ICD-10-CM | POA: Diagnosis not present

## 2022-09-09 DIAGNOSIS — G894 Chronic pain syndrome: Secondary | ICD-10-CM | POA: Diagnosis not present

## 2022-09-09 DIAGNOSIS — Z79891 Long term (current) use of opiate analgesic: Secondary | ICD-10-CM | POA: Diagnosis not present

## 2022-09-09 DIAGNOSIS — M545 Low back pain, unspecified: Secondary | ICD-10-CM | POA: Diagnosis not present

## 2022-09-09 DIAGNOSIS — K629 Disease of anus and rectum, unspecified: Secondary | ICD-10-CM | POA: Diagnosis not present

## 2022-10-16 ENCOUNTER — Telehealth: Payer: Self-pay | Admitting: Internal Medicine

## 2022-10-16 NOTE — Telephone Encounter (Signed)
LVM for pt to rtn my call to schedule AWV with [facility] call back # [phone] 

## 2022-10-17 ENCOUNTER — Ambulatory Visit (INDEPENDENT_AMBULATORY_CARE_PROVIDER_SITE_OTHER): Payer: Medicare Other | Admitting: Internal Medicine

## 2022-10-17 VITALS — BP 170/92 | HR 102 | Temp 98.3°F | Ht 74.0 in | Wt 228.0 lb

## 2022-10-17 DIAGNOSIS — E559 Vitamin D deficiency, unspecified: Secondary | ICD-10-CM | POA: Diagnosis not present

## 2022-10-17 DIAGNOSIS — I10 Essential (primary) hypertension: Secondary | ICD-10-CM | POA: Diagnosis not present

## 2022-10-17 DIAGNOSIS — I2510 Atherosclerotic heart disease of native coronary artery without angina pectoris: Secondary | ICD-10-CM

## 2022-10-17 DIAGNOSIS — E538 Deficiency of other specified B group vitamins: Secondary | ICD-10-CM | POA: Diagnosis not present

## 2022-10-17 DIAGNOSIS — I2583 Coronary atherosclerosis due to lipid rich plaque: Secondary | ICD-10-CM | POA: Diagnosis not present

## 2022-10-17 MED ORDER — ROSUVASTATIN CALCIUM 40 MG PO TABS: 40.0000 mg | ORAL_TABLET | Freq: Every day | ORAL | 3 refills | Status: DC

## 2022-10-17 NOTE — Patient Instructions (Addendum)
Please take OTC Vitamin D3 at 2000 units per day, indefinitely, and the OTC B12 100 mcg per day  Please do not add Salt to your food  Please start Aspirin 81 mg per day (such as OTC Ecotrin 81 mg per day)  Please check your BP at home once per day for 7 days and call with the results  Ok to change the lovastatin to crestor 40 mg per day  Please continue all other medications as before, and refills have been done if requested.  Please have the pharmacy call with any other refills you may need.  Please continue your efforts at being more active, low cholesterol diet, and weight control.  You are otherwise up to date with prevention measures today.  Please keep your appointments with your specialists as you may have planned  We can hold on lab testing today as we are making the changes above  Please make an Appointment to return in 6 months, or sooner if needed

## 2022-10-17 NOTE — Assessment & Plan Note (Signed)
Last vitamin D Lab Results  Component Value Date   VD25OH 31.38 04/16/2022   Low, to start oral replacement

## 2022-10-17 NOTE — Progress Notes (Signed)
Patient ID: Ryan Davila, male   DOB: [birth year], 71 y.o.   MRN: [id number]        Chief Complaint: follow up HTN, HLD, low vit d, low vit b12, CAD       HPI:  Ryan Davila is a 71 y.o. male here overall doing ok,  Pt denies chest pain, increased sob or doe, wheezing, orthopnea, PND, increased LE swelling, palpitations, dizziness or syncope.   Pt denies polydipsia, polyuria, or new focal neuro s/s.    Pt denies fever, wt loss, night sweats, loss of appetite, or other constitutional symptoms  Lost wt from peak 265, now down with better diet.  Has not checked BP at home recently, but wiling to start.  Admits to LOT of salt with food.  Declines flu shot and shingles shot.  Not taking asa 81, Vit d or b12.   Wt Readings from Last 3 Encounters:  10/17/22 228 lb (103.4 kg)  06/11/22 226 lb 3.2 oz (102.6 kg)  05/16/22 226 lb 3.2 oz (102.6 kg)   BP Readings from Last 3 Encounters:  10/17/22 (!) 170/92  06/11/22 (!) 145/86  04/16/22 130/82         Past Medical History:  Diagnosis Date   ALLERGIC RHINITIS 04/18/2008   Allergy    Arthritis    Asthma    Blood transfusion without reported diagnosis    as infant   Cancer ([place])    colon cancer   Cataract    COLON CANCER, HX OF 04/18/2008   DEPRESSION 04/18/2008   GERD 04/18/2008   Glaucoma    HEPATITIS B, HX OF 04/18/2008   HYPERLIPIDEMIA 04/18/2008   HYPERTENSION 04/18/2008   Impaired glucose tolerance 04/23/2011   Past Surgical History:  Procedure Laterality Date   APPENDECTOMY     COLONOSCOPY     COLOSTOMY     LIVER SURGERY     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 He has quit using smokeless tobacco. He reports current alcohol use. He reports that he does not use drugs. family history includes Breast cancer in his maternal aunt; Colon cancer in his  maternal grandfather; Diabetes in his father and sister; Prostate cancer in his maternal uncle. No Known Allergies Current Outpatient Medications on File Prior to Visit  Medication Sig Dispense Refill   albuterol (VENTOLIN HFA) 108 (90 Base) MCG/ACT inhaler INHALE 2 PUFFS BY MOUTH EVERY 6 HOURS AS NEEDED FOR WHEEZE 8.5 g 3   amLODipine-olmesartan (AZOR) 5-40 MG tablet TAKE 1 TABLET BY MOUTH EVERY DAY 90 tablet 3   cyclobenzaprine (FLEXERIL) 10 MG tablet Take 1 tablet (10 mg total) by mouth 3 (three) times daily as needed for muscle spasms. 15 tablet 0   hydrALAZINE (APRESOLINE) 50 MG tablet TAKE 1 TABLET BY MOUTH THREE TIMES A DAY 270 tablet 0   HYDROcodone-acetaminophen (VICODIN) 5-500 MG per tablet Take 1 tablet by mouth every 8 (eight) hours as needed.     latanoprost (XALATAN) 0.005 % ophthalmic solution Place 1 drop into both eyes at bedtime. 0.005 %     methadone (DOLOPHINE) 5 MG tablet Take 5 mg by mouth daily. '5mg'$  taken 5 times a day. Total '25mg'$  per day.     tamsulosin (FLOMAX) 0.4  MG CAPS capsule TAKE 1 CAPSULE BY MOUTH EVERY DAY 90 capsule 3   nitroGLYCERIN (NITROSTAT) 0.4 MG SL tablet Place 1 tablet (0.4 mg total) under the tongue every 5 (five) minutes as needed for chest pain. 40 tablet 5   No current facility-administered medications on file prior to visit.        ROS:  All others reviewed and negative.  Objective        PE:  BP (!) 170/92 (BP Location: Left Arm, Patient Position: Sitting, Cuff Size: Large)   Pulse (!) 102   Temp 98.3 F (36.8 C) (Oral)   Ht '6\' 2"'$  (1.88 m)   Wt 228 lb (103.4 kg)   SpO2 94%   BMI 29.27 kg/m                 Constitutional: Pt appears in NAD               HENT: Head: NCAT.                Right Ear: External ear normal.                 Left Ear: External ear normal.                Eyes: . Pupils are equal, round, and reactive to light. Conjunctivae and EOM are normal               Nose: without d/c or deformity               Neck: Neck  supple. Gross normal ROM               Cardiovascular: Normal rate and regular rhythm.                 Pulmonary/Chest: Effort normal and breath sounds without rales or wheezing.                Abd:  Soft, NT, ND, + BS, no organomegaly               Neurological: Pt is alert. At baseline orientation, motor grossly intact               Skin: Skin is warm. No rashes, no other new lesions, LE edema - none               Psychiatric: Pt behavior is normal without agitation   Micro: none  Cardiac tracings I have personally interpreted today:  none  Pertinent Radiological findings (summarize): none   Lab Results  Component Value Date   WBC 9.2 04/16/2022   HGB 15.3 04/16/2022   HCT 44.4 04/16/2022   PLT 234.0 04/16/2022   GLUCOSE 123 (H) 04/16/2022   CHOL 177 04/16/2022   TRIG 173.0 (H) 04/16/2022   HDL 54.20 04/16/2022   LDLDIRECT 89.0 07/17/2018   LDLCALC 88 04/16/2022   ALT 15 04/16/2022   AST 17 04/16/2022   NA 136 04/16/2022   K 3.9 04/16/2022   CL 101 04/16/2022   CREATININE 1.31 04/16/2022   BUN 16 04/16/2022   CO2 25 04/16/2022   TSH 3.12 04/16/2022   PSA 0.38 04/16/2022   INR 0.84 02/22/2010   HGBA1C 5.6 04/16/2022   Assessment/Plan:  Ryan Davila is a 71 y.o. White or Caucasian [1] male with  has a past medical history of ALLERGIC RHINITIS (04/18/2008), Allergy, Arthritis, Asthma, Blood transfusion without reported diagnosis, Cancer ([place]), Cataract, COLON CANCER, HX  OF (04/18/2008), DEPRESSION (04/18/2008), GERD (04/18/2008), Glaucoma, HEPATITIS B, HX OF (04/18/2008), HYPERLIPIDEMIA (04/18/2008), HYPERTENSION (04/18/2008), and Impaired glucose tolerance (04/23/2011).  Vitamin D deficiency Last vitamin D Lab Results  Component Value Date   VD25OH 31.38 04/16/2022   Low, to start oral replacement   Essential hypertension BP Readings from Last 3 Encounters:  10/17/22 (!) 170/92  06/11/22 (!) 145/86  04/16/22 130/82   Uncontroled, admits to high salt  intake, declines any med change today,  pt to continue medical treatment azor 5 40 mg qd, hydralzine 50 tid, call with BP in 1 wk from home daily checks   B12 deficiency Lab Results  Component Value Date   VITAMINB12 251 04/16/2022   Low, to start  oral replacement - b12 1000 mcg qd   CAD (coronary artery disease) To start asa 81 mg qd  Followup: No follow-ups on file.  Cathlean Cower, MD 10/19/2022 8:47 PM [facility]

## 2022-10-19 DIAGNOSIS — I2510 Atherosclerotic heart disease of native coronary artery without angina pectoris: Secondary | ICD-10-CM | POA: Insufficient documentation

## 2022-10-19 NOTE — Assessment & Plan Note (Signed)
BP Readings from Last 3 Encounters:  10/17/22 (!) 170/92  06/11/22 (!) 145/86  04/16/22 130/82   Uncontroled, admits to high salt intake, declines any med change today,  pt to continue medical treatment azor 5 40 mg qd, hydralzine 50 tid, call with BP in 1 wk from home daily checks

## 2022-10-19 NOTE — Assessment & Plan Note (Signed)
Lab Results  Component Value Date   VITAMINB12 251 04/16/2022   Low, to start  oral replacement - b12 1000 mcg qd

## 2022-10-19 NOTE — Assessment & Plan Note (Signed)
To start asa 81 mg qd

## 2022-11-06 DIAGNOSIS — M545 Low back pain, unspecified: Secondary | ICD-10-CM | POA: Diagnosis not present

## 2022-11-06 DIAGNOSIS — K629 Disease of anus and rectum, unspecified: Secondary | ICD-10-CM | POA: Diagnosis not present

## 2022-11-06 DIAGNOSIS — Z79891 Long term (current) use of opiate analgesic: Secondary | ICD-10-CM | POA: Diagnosis not present

## 2022-11-06 DIAGNOSIS — G894 Chronic pain syndrome: Secondary | ICD-10-CM | POA: Diagnosis not present

## 2023-03-22 ENCOUNTER — Other Ambulatory Visit: Payer: Self-pay | Admitting: Internal Medicine

## 2023-04-17 ENCOUNTER — Ambulatory Visit: Payer: Medicare Other | Admitting: Internal Medicine

## 2023-04-21 ENCOUNTER — Other Ambulatory Visit: Payer: Self-pay | Admitting: Internal Medicine

## 2023-05-01 ENCOUNTER — Ambulatory Visit: Payer: Medicare Other | Admitting: Internal Medicine

## 2023-05-15 ENCOUNTER — Other Ambulatory Visit: Payer: Self-pay | Admitting: Internal Medicine

## 2023-05-20 ENCOUNTER — Ambulatory Visit: Payer: Medicare HMO | Admitting: Internal Medicine

## 2023-05-20 VITALS — BP 130/78 | HR 86 | Temp 98.0°F | Ht 74.0 in | Wt 222.0 lb

## 2023-05-20 DIAGNOSIS — Z0000 Encounter for general adult medical examination without abnormal findings: Secondary | ICD-10-CM | POA: Diagnosis not present

## 2023-05-20 DIAGNOSIS — R7302 Impaired glucose tolerance (oral): Secondary | ICD-10-CM | POA: Diagnosis not present

## 2023-05-20 DIAGNOSIS — E559 Vitamin D deficiency, unspecified: Secondary | ICD-10-CM | POA: Diagnosis not present

## 2023-05-20 DIAGNOSIS — Z125 Encounter for screening for malignant neoplasm of prostate: Secondary | ICD-10-CM

## 2023-05-20 DIAGNOSIS — E538 Deficiency of other specified B group vitamins: Secondary | ICD-10-CM

## 2023-05-20 DIAGNOSIS — I10 Essential (primary) hypertension: Secondary | ICD-10-CM

## 2023-05-20 DIAGNOSIS — M79604 Pain in right leg: Secondary | ICD-10-CM

## 2023-05-20 DIAGNOSIS — M79605 Pain in left leg: Secondary | ICD-10-CM

## 2023-05-20 DIAGNOSIS — E7800 Pure hypercholesterolemia, unspecified: Secondary | ICD-10-CM | POA: Diagnosis not present

## 2023-05-20 DIAGNOSIS — M75101 Unspecified rotator cuff tear or rupture of right shoulder, not specified as traumatic: Secondary | ICD-10-CM

## 2023-05-20 DIAGNOSIS — Z0001 Encounter for general adult medical examination with abnormal findings: Secondary | ICD-10-CM | POA: Insufficient documentation

## 2023-05-20 LAB — CBC WITH DIFFERENTIAL/PLATELET
Basophils Absolute: 0 10*3/uL (ref 0.0–0.1)
Basophils Relative: 0.5 % (ref 0.0–3.0)
Eosinophils Absolute: 0.1 10*3/uL (ref 0.0–0.7)
Eosinophils Relative: 1.9 % (ref 0.0–5.0)
HCT: 44.6 % (ref 39.0–52.0)
Hemoglobin: 14.8 g/dL (ref 13.0–17.0)
Lymphocytes Relative: 22.7 % (ref 12.0–46.0)
Lymphs Abs: 1.7 10*3/uL (ref 0.7–4.0)
MCHC: 33.1 g/dL (ref 30.0–36.0)
MCV: 90 fl (ref 78.0–100.0)
Monocytes Absolute: 0.6 10*3/uL (ref 0.1–1.0)
Monocytes Relative: 8.5 % (ref 3.0–12.0)
Neutro Abs: 5 10*3/uL (ref 1.4–7.7)
Neutrophils Relative %: 66.4 % (ref 43.0–77.0)
Platelets: 229 10*3/uL (ref 150.0–400.0)
RBC: 4.96 Mil/uL (ref 4.22–5.81)
RDW: 13.1 % (ref 11.5–15.5)
WBC: 7.5 10*3/uL (ref 4.0–10.5)

## 2023-05-20 LAB — HEPATIC FUNCTION PANEL
ALT: 17 U/L (ref 0–53)
AST: 17 U/L (ref 0–37)
Albumin: 4.1 g/dL (ref 3.5–5.2)
Alkaline Phosphatase: 52 U/L (ref 39–117)
Bilirubin, Direct: 0.1 mg/dL (ref 0.0–0.3)
Total Bilirubin: 0.6 mg/dL (ref 0.2–1.2)
Total Protein: 6.7 g/dL (ref 6.0–8.3)

## 2023-05-20 LAB — BASIC METABOLIC PANEL
BUN: 18 mg/dL (ref 6–23)
CO2: 30 mEq/L (ref 19–32)
Calcium: 9 mg/dL (ref 8.4–10.5)
Chloride: 103 mEq/L (ref 96–112)
Creatinine, Ser: 0.93 mg/dL (ref 0.40–1.50)
GFR: 83.15 mL/min (ref >60.00)
Glucose, Bld: 160 mg/dL — ABNORMAL HIGH (ref 70–99)
Potassium: 4.6 mEq/L (ref 3.5–5.1)
Sodium: 139 mEq/L (ref 135–145)

## 2023-05-20 LAB — LIPID PANEL
Cholesterol: 125 mg/dL (ref 0–200)
HDL: 48.8 mg/dL (ref >39.00)
LDL Cholesterol: 48 mg/dL (ref 0–99)
NonHDL: 76.68
Total CHOL/HDL Ratio: 3
Triglycerides: 143 mg/dL (ref 0.0–149.0)
VLDL: 28.6 mg/dL (ref 0.0–40.0)

## 2023-05-20 LAB — URINALYSIS, ROUTINE W REFLEX MICROSCOPIC
Bilirubin Urine: NEGATIVE
Ketones, ur: NEGATIVE
Leukocytes,Ua: NEGATIVE
Nitrite: NEGATIVE
Specific Gravity, Urine: 1.025 (ref 1.000–1.030)
Total Protein, Urine: 30 — AB
Urine Glucose: 100 — AB
Urobilinogen, UA: 0.2 (ref 0.0–1.0)
pH: 6 (ref 5.0–8.0)

## 2023-05-20 LAB — TSH: TSH: 1.22 u[IU]/mL (ref 0.35–5.50)

## 2023-05-20 LAB — VITAMIN D 25 HYDROXY (VIT D DEFICIENCY, FRACTURES): VITD: 33.92 ng/mL (ref 30.00–100.00)

## 2023-05-20 LAB — HEMOGLOBIN A1C: Hgb A1c MFr Bld: 5.9 % (ref 4.6–6.5)

## 2023-05-20 LAB — VITAMIN B12: Vitamin B-12: 215 pg/mL (ref 211–911)

## 2023-05-20 LAB — PSA: PSA: 0.51 ng/mL (ref 0.10–4.00)

## 2023-05-20 NOTE — Patient Instructions (Signed)
Please continue all other medications as before, and refills have been done if requested.  Please have the pharmacy call with any other refills you may need.  Please continue your efforts at being more active, low cholesterol diet, and weight control.  You are otherwise up to date with prevention measures today.  Please keep your appointments with your specialists as you may have planned  You will be contacted regarding the referral for: leg artery ultrasound  You will be contacted regarding the referral for: Dr Ranell Patrick - orthopedic  Please go to the LAB at the blood drawing area for the tests to be done  You will be contacted by phone if any changes need to be made immediately.  Otherwise, you will receive a letter about your results with an explanation, but please check with MyChart first.  Please remember to sign up for MyChart if you have not done so, as this will be important to you in the future with finding out test results, communicating by private email, and scheduling acute appointments online when needed.  Please make an Appointment to return in 6 months, or sooner if needed

## 2023-05-20 NOTE — Progress Notes (Unsigned)
Patient ID: Ryan Davila, male   DOB: [birth year], 72 y.o.   MRN: [id number]         Chief Complaint:: wellness exam and bilat leg pain, low b12, low vit d, right shoulder pain       HPI:  Ryan Davila is a 72 y.o. male here for wellness exam; up to date               Also Pt denies chest pain, increased sob or doe, wheezing, orthopnea, PND, increased LE swelling, palpitations, dizziness or syncope, but does bilat leg pain below the knees only  with walking in the past 1-2 mo, also has chronic years of right shoulder pain and reduced rom, has known right rot cuff tearing but not advised for surgury year ago, now with worsening pain and impairment, asks for referral back to [facility].   Pt denies polydipsia, polyuria, or new focal neuro s/s.    Pt denies fever, wt loss, night sweats, loss of appetite, or other constitutional symptoms     Wt Readings from Last 3 Encounters:  05/20/23 222 lb (100.7 kg)  10/17/22 228 lb (103.4 kg)  06/11/22 226 lb 3.2 oz (102.6 kg)   BP Readings from Last 3 Encounters:  05/20/23 130/78  10/17/22 (!) 170/92  06/11/22 (!) 145/86   Immunization History  Administered Date(s) Administered   Pneumococcal Conjugate-13 07/17/2018   Td 08/24/2009   Tdap 04/13/2021   Health Maintenance Due  Topic Date Due   Medicare Annual Wellness (AWV)  Never done      Past Medical History:  Diagnosis Date   ALLERGIC RHINITIS 04/18/2008   Allergy    Arthritis    Asthma    Blood transfusion without reported diagnosis    as infant   Cancer ([facility])    colon cancer   Cataract    COLON CANCER, HX OF 04/18/2008   DEPRESSION 04/18/2008   GERD 04/18/2008   Glaucoma    HEPATITIS B, HX OF 04/18/2008   HYPERLIPIDEMIA 04/18/2008   HYPERTENSION 04/18/2008   Impaired glucose tolerance 04/23/2011   Past Surgical History:  Procedure Laterality Date   APPENDECTOMY     COLONOSCOPY     COLOSTOMY     LIVER SURGERY     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 He has quit using smokeless tobacco. He reports current alcohol use. He reports that he does not use drugs. family history includes Breast cancer in his maternal aunt; Colon cancer in his maternal grandfather; Diabetes in his father and sister; Prostate cancer in his maternal uncle. No Known Allergies Current Outpatient Medications on File Prior to Visit  Medication Sig Dispense Refill   albuterol (VENTOLIN HFA) 108 (90 Base) MCG/ACT inhaler INHALE 2 PUFFS BY MOUTH EVERY 6 HOURS AS NEEDED FOR WHEEZE 8.5 g 3   amLODipine-olmesartan (AZOR) 5-40 MG tablet TAKE 1 TABLET BY MOUTH EVERY DAY 90 tablet 3   cyclobenzaprine (FLEXERIL) 10 MG tablet Take 1 tablet (10 mg total) by mouth 3 (three) times daily as needed for muscle spasms. 15 tablet 0   HYDROcodone-acetaminophen (VICODIN) 5-500 MG per tablet Take 1 tablet by mouth every 8 (eight) hours as needed.     latanoprost (XALATAN) 0.005 %  ophthalmic solution Place 1 drop into both eyes at bedtime. 0.005 %     lovastatin (MEVACOR) 40 MG tablet Take 40 mg by mouth daily.     methadone (DOLOPHINE) 5 MG tablet Take 5 mg by mouth daily. 5mg  taken 5 times a day. Total 25mg  per day.     rosuvastatin (CRESTOR) 40 MG tablet Take 1 tablet (40 mg total) by mouth daily. 90 tablet 3   tamsulosin (FLOMAX) 0.4 MG CAPS capsule TAKE 1 CAPSULE BY MOUTH EVERY DAY 90 capsule 3   hydrALAZINE (APRESOLINE) 50 MG tablet TAKE 1 TABLET BY MOUTH THREE TIMES A DAY (Patient not taking: Reported on 05/20/2023) 270 tablet 0   nitroGLYCERIN (NITROSTAT) 0.4 MG SL tablet Place 1 tablet (0.4 mg total) under the tongue every 5 (five) minutes as needed for chest pain. 40 tablet 5   No current facility-administered medications on file prior to visit.        ROS:  All others reviewed and negative.  Objective        PE:  BP  130/78 (BP Location: Left Arm, Patient Position: Sitting, Cuff Size: Normal)   Pulse 86   Temp 98 F (36.7 C) (Oral)   Ht 6\' 2"  (1.88 m)   Wt 222 lb (100.7 kg)   SpO2 96%   BMI 28.50 kg/m                 Constitutional: Pt appears in NAD               HENT: Head: NCAT.                Right Ear: External ear normal.                 Left Ear: External ear normal.                Eyes: . Pupils are equal, round, and reactive to light. Conjunctivae and EOM are normal               Nose: without d/c or deformity               Neck: Neck supple. Gross normal ROM               Cardiovascular: Normal rate and regular rhythm.                 Pulmonary/Chest: Effort normal and breath sounds without rales or wheezing.                Abd:  Soft, NT, ND, + BS, no organomegaly               Neurological: Pt is alert. At baseline orientation, motor grossly intact               Skin: Skin is warm. No rashes, no other new lesions, LE edema - none               Psychiatric: Pt behavior is normal without agitation   Micro: none  Cardiac tracings I have personally interpreted today:  none  Pertinent Radiological findings (summarize): none   Lab Results  Component Value Date   WBC 7.5 05/20/2023   HGB 14.8 05/20/2023   HCT 44.6 05/20/2023   PLT 229.0 05/20/2023   GLUCOSE 160 (H) 05/20/2023   CHOL 125 05/20/2023   TRIG 143.0 05/20/2023   HDL 48.80 05/20/2023   LDLDIRECT 89.0 07/17/2018   LDLCALC 48 05/20/2023  ALT 17 05/20/2023   AST 17 05/20/2023   NA 139 05/20/2023   K 4.6 05/20/2023   CL 103 05/20/2023   CREATININE 0.93 05/20/2023   BUN 18 05/20/2023   CO2 30 05/20/2023   TSH 1.22 05/20/2023   PSA 0.51 05/20/2023   INR 0.84 02/22/2010   HGBA1C 5.9 05/20/2023   Assessment/Plan:  Ryan Davila is a 72 y.o. White or Caucasian [1] male with  has a past medical history of ALLERGIC RHINITIS (04/18/2008), Allergy, Arthritis, Asthma, Blood transfusion without reported diagnosis,  Cancer ([facility]), Cataract, COLON CANCER, HX OF (04/18/2008), DEPRESSION (04/18/2008), GERD (04/18/2008), Glaucoma, HEPATITIS B, HX OF (04/18/2008), HYPERLIPIDEMIA (04/18/2008), HYPERTENSION (04/18/2008), and Impaired glucose tolerance (04/23/2011).  Encounter for well adult exam with abnormal findings Age and sex appropriate education and counseling updated with regular exercise and diet Referrals for preventative services - none needed Immunizations addressed - none needed Smoking counseling  - none needed Evidence for depression or other mood disorder - none significant Most recent labs reviewed. I have personally reviewed and have noted: 1) the patient's medical and social history 2) The patient's current medications and supplements 3) The patient's height, weight, and BMI have been recorded in the chart   Hyperlipidemia Lab Results  Component Value Date   LDLCALC 48 05/20/2023   Stable, pt to continue current statin crestor 40 d   Essential hypertension BP Readings from Last 3 Encounters:  05/20/23 130/78  10/17/22 (!) 170/92  06/11/22 (!) 145/86   Stable, pt to continue medical treatment azor 5 40 qd, hydralazine 50 tid   Impaired glucose tolerance Lab Results  Component Value Date   HGBA1C 5.9 05/20/2023   Stable, pt to continue current medical treatment  - diet,wt control   Vitamin D deficiency Last vitamin D Lab Results  Component Value Date   VD25OH 33.92 05/20/2023   Low, to start oral replacement   B12 deficiency Lab Results  Component Value Date   VITAMINB12 215 05/20/2023   Low, to start oral replacement - b12 1000 mcg qd   Bilateral leg pain Can't r/o claudication - for leg arterial circulation eval  Right rotator cuff tear With worsening pain, decreased ROM - for referral to ortho  Followup: Return in about 6 months (around 11/19/2023).  Oliver Barre, MD 05/22/2023 7:52 PM [facility]

## 2023-05-20 NOTE — Progress Notes (Signed)
The test results show that your current treatment is OK, as the tests are stable.  Please continue the same plan.  There is no other need for change of treatment or further evaluation based on these results, at this time.  thanks 

## 2023-05-22 ENCOUNTER — Encounter: Payer: Self-pay | Admitting: Internal Medicine

## 2023-05-22 NOTE — Assessment & Plan Note (Signed)
Lab Results  Component Value Date   HGBA1C 5.9 05/20/2023   Stable, pt to continue current medical treatment  - diet,wt control  

## 2023-05-22 NOTE — Assessment & Plan Note (Signed)
Last vitamin D Lab Results  Component Value Date   VD25OH 33.92 05/20/2023   Low, to start oral replacement

## 2023-05-22 NOTE — Assessment & Plan Note (Signed)
BP Readings from Last 3 Encounters:  05/20/23 130/78  10/17/22 (!) 170/92  06/11/22 (!) 145/86   Stable, pt to continue medical treatment azor 5 40 qd, hydralazine 50 tid

## 2023-05-22 NOTE — Assessment & Plan Note (Signed)
Lab Results  Component Value Date   LDLCALC 48 05/20/2023   Stable, pt to continue current statin crestor 40 d

## 2023-05-22 NOTE — Assessment & Plan Note (Signed)
Can't r/o claudication - for leg arterial circulation eval

## 2023-05-22 NOTE — Assessment & Plan Note (Signed)

## 2023-05-22 NOTE — Assessment & Plan Note (Signed)
Lab Results  Component Value Date   VITAMINB12 215 05/20/2023   Low, to start oral replacement - b12 1000 mcg qd

## 2023-05-22 NOTE — Assessment & Plan Note (Signed)
With worsening pain, decreased ROM - for referral to ortho

## 2023-06-04 ENCOUNTER — Ambulatory Visit (HOSPITAL_COMMUNITY)
Admission: RE | Admit: 2023-06-04 | Discharge: 2023-06-04 | Disposition: A | Discharge status: Home / Self Care | Payer: Medicare HMO | Source: Ambulatory Visit | Attending: Internal Medicine | Admitting: Internal Medicine

## 2023-06-04 ENCOUNTER — Other Ambulatory Visit: Payer: Self-pay

## 2023-06-04 ENCOUNTER — Telehealth: Payer: Self-pay | Admitting: Internal Medicine

## 2023-06-04 DIAGNOSIS — M79605 Pain in left leg: Secondary | ICD-10-CM | POA: Diagnosis present

## 2023-06-04 DIAGNOSIS — M79604 Pain in right leg: Secondary | ICD-10-CM | POA: Diagnosis present

## 2023-06-04 LAB — VAS US ABI WITH/WO TBI
Left ABI: 1.07
Right ABI: 1.09

## 2023-06-04 MED ORDER — ALBUTEROL SULFATE HFA 108 (90 BASE) MCG/ACT IN AERS: INHALATION_SPRAY | RESPIRATORY_TRACT | 3 refills | Status: DC

## 2023-06-04 NOTE — Telephone Encounter (Signed)
Prescription Request  06/04/2023  LOV: 05/20/2023  What is the name of the medication or equipment? albuterol (VENTOLIN HFA) 108 (90 Base) MCG/ACT inhaler   Have you contacted your pharmacy to request a refill? Yes   Which pharmacy would you like this sent to?  [id number] - [place], [place] - [address] [place] [address] Phone: [phone] Fax: [phone]    Patient notified that their request is being sent to the clinical staff for review and that they should receive a response within 2 business days.   Please advise at Mobile [phone] (mobile)

## 2023-06-04 NOTE — Telephone Encounter (Signed)
Refill Sent. 

## 2023-07-09 ENCOUNTER — Other Ambulatory Visit: Payer: Self-pay | Admitting: Internal Medicine

## 2023-11-20 ENCOUNTER — Ambulatory Visit: Payer: Medicare HMO | Admitting: Internal Medicine

## 2023-12-03 ENCOUNTER — Encounter: Payer: Self-pay | Admitting: Internal Medicine

## 2023-12-03 ENCOUNTER — Ambulatory Visit (INDEPENDENT_AMBULATORY_CARE_PROVIDER_SITE_OTHER): Payer: Medicare HMO | Admitting: Internal Medicine

## 2023-12-03 VITALS — BP 120/68 | HR 104 | Temp 98.0°F | Ht 74.0 in | Wt 223.0 lb

## 2023-12-03 DIAGNOSIS — I10 Essential (primary) hypertension: Secondary | ICD-10-CM | POA: Diagnosis not present

## 2023-12-03 DIAGNOSIS — E538 Deficiency of other specified B group vitamins: Secondary | ICD-10-CM

## 2023-12-03 DIAGNOSIS — R7302 Impaired glucose tolerance (oral): Secondary | ICD-10-CM | POA: Diagnosis not present

## 2023-12-03 DIAGNOSIS — E7800 Pure hypercholesterolemia, unspecified: Secondary | ICD-10-CM | POA: Diagnosis not present

## 2023-12-03 DIAGNOSIS — E559 Vitamin D deficiency, unspecified: Secondary | ICD-10-CM

## 2023-12-03 NOTE — Progress Notes (Signed)
Patient ID: Ryan Davila, male   DOB: [birth year], 72 y.o.   MRN: [id number]        Chief Complaint: follow up low b12 and vit d, bilateral knee djd, right shoulder pain, hld, htn, hyperglycemia,        HPI:  Ryan Davila is a 72 y.o. male here overall doing ok, Pt denies chest pain, increased sob or doe, wheezing, orthopnea, PND, increased LE swelling, palpitations, dizziness or syncope.   Pt denies polydipsia, polyuria, or new focal neuro s/s.    Pt denies fever, wt loss, night sweats, loss of appetite, or other constitutional symptoms  Does have worsening mod to severe left > right knee arthritis with effusions but has avoided all ortho in the last year as he does not want surgury.  Also has known right shoulder rotater cuff injury but avoiding ortho as well for this.  Slowed down by pain, but wt has overall been stable.  Plans to start being more active soon.       Wt Readings from Last 3 Encounters:  12/03/23 223 lb (101.2 kg)  05/20/23 222 lb (100.7 kg)  10/17/22 228 lb (103.4 kg)   BP Readings from Last 3 Encounters:  12/03/23 120/68  05/20/23 130/78  10/17/22 (!) 170/92         Past Medical History:  Diagnosis Date   ALLERGIC RHINITIS 04/18/2008   Allergy    Arthritis    Asthma    Blood transfusion without reported diagnosis    as infant   Cancer (HCC)    colon cancer   Cataract    COLON CANCER, HX OF 04/18/2008   DEPRESSION 04/18/2008   GERD 04/18/2008   Glaucoma    HEPATITIS B, HX OF 04/18/2008   HYPERLIPIDEMIA 04/18/2008   HYPERTENSION 04/18/2008   Impaired glucose tolerance 04/23/2011   Past Surgical History:  Procedure Laterality Date   APPENDECTOMY     COLONOSCOPY     COLOSTOMY     LIVER SURGERY     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 He has quit using smokeless tobacco. He  reports current alcohol use. He reports that he does not use drugs. family history includes Breast cancer in his maternal aunt; Colon cancer in his maternal grandfather; Diabetes in his father and sister; Prostate cancer in his maternal uncle. No Known Allergies Current Outpatient Medications on File Prior to Visit  Medication Sig Dispense Refill   albuterol (VENTOLIN HFA) 108 (90 Base) MCG/ACT inhaler INHALE 2 PUFFS BY MOUTH EVERY 6 HOURS AS NEEDED FOR WHEEZE 8.5 g 3   amLODipine-olmesartan (AZOR) 5-40 MG tablet TAKE 1 TABLET BY MOUTH EVERY DAY 90 tablet 3   cyclobenzaprine (FLEXERIL) 10 MG tablet Take 1 tablet (10 mg total) by mouth 3 (three) times daily as needed for muscle spasms. 15 tablet 0   HYDROcodone-acetaminophen (VICODIN) 5-500 MG per tablet Take 1 tablet by mouth every 8 (eight) hours as needed.     latanoprost (XALATAN) 0.005 % ophthalmic solution Place 1 drop into both eyes at bedtime. 0.005 %     lovastatin (MEVACOR) 40 MG tablet TAKE 1 TABLET BY MOUTH EVERY DAY 90 tablet 2   methadone (DOLOPHINE) 5 MG tablet Take 5 mg by  mouth daily. 5mg  taken 5 times a day. Total 25mg  per day.     rosuvastatin (CRESTOR) 40 MG tablet Take 1 tablet (40 mg total) by mouth daily. 90 tablet 3   tamsulosin (FLOMAX) 0.4 MG CAPS capsule TAKE 1 CAPSULE BY MOUTH EVERY DAY 90 capsule 3   hydrALAZINE (APRESOLINE) 50 MG tablet TAKE 1 TABLET BY MOUTH THREE TIMES A DAY (Patient not taking: Reported on 12/03/2023) 270 tablet 0   nitroGLYCERIN (NITROSTAT) 0.4 MG SL tablet Place 1 tablet (0.4 mg total) under the tongue every 5 (five) minutes as needed for chest pain. 40 tablet 5   No current facility-administered medications on file prior to visit.        ROS:  All others reviewed and negative.  Objective        PE:  BP 120/68 (BP Location: Right Arm, Patient Position: Sitting, Cuff Size: Normal)   Pulse (!) 104   Temp 98 F (36.7 C) (Oral)   Ht 6\' 2"  (1.88 m)   Wt 223 lb (101.2 kg)   SpO2 98%   BMI  28.63 kg/m                 Constitutional: Pt appears in NAD               HENT: Head: NCAT.                Right Ear: External ear normal.                 Left Ear: External ear normal.                Eyes: . Pupils are equal, round, and reactive to light. Conjunctivae and EOM are normal               Nose: without d/c or deformity               Neck: Neck supple. Gross normal ROM               Cardiovascular: Normal rate and regular rhythm.                 Pulmonary/Chest: Effort normal and breath sounds without rales or wheezing.                Abd:  Soft, NT, ND, + BS, no organomegaly               Neurological: Pt is alert. At baseline orientation, motor grossly intact               Skin: Skin is warm. No rashes, no other new lesions, LE edema - none               Psychiatric: Pt behavior is normal without agitation   Micro: none  Cardiac tracings I have personally interpreted today:  none  Pertinent Radiological findings (summarize): none   Lab Results  Component Value Date   WBC 7.5 05/20/2023   HGB 14.8 05/20/2023   HCT 44.6 05/20/2023   PLT 229.0 05/20/2023   GLUCOSE 160 (H) 05/20/2023   CHOL 125 05/20/2023   TRIG 143.0 05/20/2023   HDL 48.80 05/20/2023   LDLDIRECT 89.0 07/17/2018   LDLCALC 48 05/20/2023   ALT 17 05/20/2023   AST 17 05/20/2023   NA 139 05/20/2023   K 4.6 05/20/2023   CL 103 05/20/2023   CREATININE 0.93 05/20/2023   BUN 18 05/20/2023   CO2 30 05/20/2023  TSH 1.22 05/20/2023   PSA 0.51 05/20/2023   INR 0.84 02/22/2010   HGBA1C 5.9 05/20/2023   Assessment/Plan:  Ryan Davila is a 72 y.o. White or Caucasian [1] male with  has a past medical history of ALLERGIC RHINITIS (04/18/2008), Allergy, Arthritis, Asthma, Blood transfusion without reported diagnosis, Cancer (HCC), Cataract, COLON CANCER, HX OF (04/18/2008), DEPRESSION (04/18/2008), GERD (04/18/2008), Glaucoma, HEPATITIS B, HX OF (04/18/2008), HYPERLIPIDEMIA (04/18/2008), HYPERTENSION  (04/18/2008), and Impaired glucose tolerance (04/23/2011).  Hyperlipidemia Lab Results  Component Value Date   LDLCALC 48 05/20/2023   Stable, pt to continue current statin lovastatin 40 mg qd   Essential hypertension BP Readings from Last 3 Encounters:  12/03/23 120/68  05/20/23 130/78  10/17/22 (!) 170/92   Stable, pt to continue medical treatment azor 5 40 mg every day, hyddralazine 50 tid   Impaired glucose tolerance Lab Results  Component Value Date   HGBA1C 5.9 05/20/2023   Stable, pt to continue current medical treatment  - diet, wt control   Vitamin D deficiency Last vitamin D Lab Results  Component Value Date   VD25OH 33.92 05/20/2023   Low, to start oral replacement   B12 deficiency Lab Results  Component Value Date   VITAMINB12 215 05/20/2023   Low, to start oral replacement - b12 1000 mcg qd  Followup: Return in about 6 months (around 06/02/2024).  Oliver Barre, MD 12/06/2023 1:42 PM [facility]

## 2023-12-03 NOTE — Patient Instructions (Signed)
Please continue all other medications as before, and refills have been done if requested.  Please have the pharmacy call with any other refills you may need.  Please continue your efforts at being more active, low cholesterol diet, and weight control.  Please keep your appointments with your specialists as you may have planned  Please make an Appointment to return in 6 months, or sooner if needed 

## 2023-12-06 ENCOUNTER — Encounter: Payer: Self-pay | Admitting: Internal Medicine

## 2023-12-06 NOTE — Assessment & Plan Note (Signed)
Lab Results  Component Value Date   VITAMINB12 215 05/20/2023   Low, to start oral replacement - b12 1000 mcg qd

## 2023-12-06 NOTE — Assessment & Plan Note (Signed)
Lab Results  Component Value Date   LDLCALC 48 05/20/2023   Stable, pt to continue current statin lovastatin 40 mg qd

## 2023-12-06 NOTE — Assessment & Plan Note (Signed)
Lab Results  Component Value Date   HGBA1C 5.9 05/20/2023   Stable, pt to continue current medical treatment  - diet,wt control  

## 2023-12-06 NOTE — Assessment & Plan Note (Signed)
BP Readings from Last 3 Encounters:  12/03/23 120/68  05/20/23 130/78  10/17/22 (!) 170/92   Stable, pt to continue medical treatment azor 5 40 mg every day, hyddralazine 50 tid

## 2023-12-06 NOTE — Assessment & Plan Note (Signed)
Last vitamin D Lab Results  Component Value Date   VD25OH 33.92 05/20/2023   Low, to start oral replacement

## 2024-01-02 ENCOUNTER — Other Ambulatory Visit: Payer: Self-pay

## 2024-01-02 ENCOUNTER — Other Ambulatory Visit: Payer: Self-pay | Admitting: Internal Medicine

## 2024-02-25 DIAGNOSIS — G893 Neoplasm related pain (acute) (chronic): Secondary | ICD-10-CM | POA: Insufficient documentation

## 2024-03-04 ENCOUNTER — Encounter: Payer: Self-pay | Admitting: Internal Medicine

## 2024-03-04 ENCOUNTER — Ambulatory Visit (INDEPENDENT_AMBULATORY_CARE_PROVIDER_SITE_OTHER)

## 2024-03-04 ENCOUNTER — Ambulatory Visit (INDEPENDENT_AMBULATORY_CARE_PROVIDER_SITE_OTHER): Admitting: Internal Medicine

## 2024-03-04 VITALS — BP 142/70 | HR 82 | Temp 97.8°F | Ht 74.0 in | Wt 218.0 lb

## 2024-03-04 DIAGNOSIS — Z0000 Encounter for general adult medical examination without abnormal findings: Secondary | ICD-10-CM | POA: Diagnosis not present

## 2024-03-04 DIAGNOSIS — I10 Essential (primary) hypertension: Secondary | ICD-10-CM | POA: Diagnosis not present

## 2024-03-04 DIAGNOSIS — E559 Vitamin D deficiency, unspecified: Secondary | ICD-10-CM

## 2024-03-04 DIAGNOSIS — M170 Bilateral primary osteoarthritis of knee: Secondary | ICD-10-CM

## 2024-03-04 DIAGNOSIS — R7302 Impaired glucose tolerance (oral): Secondary | ICD-10-CM

## 2024-03-04 DIAGNOSIS — Z0001 Encounter for general adult medical examination with abnormal findings: Secondary | ICD-10-CM

## 2024-03-04 DIAGNOSIS — E7800 Pure hypercholesterolemia, unspecified: Secondary | ICD-10-CM

## 2024-03-04 NOTE — Progress Notes (Signed)
 Patient ID: Ryan Davila, male   DOB: 1951-07-30, 73 y.o.   MRN: [id number]         Chief Complaint:: wellness exam and Leg Pain (In both has been going on for a year and has gotten worse )  Due to bilateral knee pain arthritis left > right; htn, hld, low vit d       HPI:  Ryan Davila is a 73 y.o. male here for wellness exam; declines flu shot, ow up to date                        Also for surgury right shoulder replacement mar 28 with Dr Prentiss Bells [facility], deciines furhter lab today, just done mar 12.  Pt denies chest pain, increased sob or doe, wheezing, orthopnea, PND, increased LE swelling, palpitations, dizziness or syncope.   Pt denies polydipsia, polyuria, or new focal neuro s/s.    Pt denies fever, wt loss, night sweats, loss of appetite, or other constitutional symptoms Also has worsening bilateral knee arthritis pain left > right, wife sees ortho Dr Lequita Halt, asking for referral.     Wt Readings from Last 3 Encounters:  03/04/24 218 lb (98.9 kg)  12/03/23 223 lb (101.2 kg)  05/20/23 222 lb (100.7 kg)   BP Readings from Last 3 Encounters:  03/04/24 (!) 142/70  12/03/23 120/68  05/20/23 130/78   Immunization History  Administered Date(s) Administered   Pneumococcal Conjugate-13 07/17/2018   Td 08/24/2009   Tdap 04/13/2021   Health Maintenance Due  Topic Date Due   Medicare Annual Wellness (AWV)  Never done      Past Medical History:  Diagnosis Date   ALLERGIC RHINITIS 04/18/2008   Allergy    Arthritis    Asthma    Blood transfusion without reported diagnosis    as infant   Cancer (HCC)    colon cancer   Cataract    COLON CANCER, HX OF 04/18/2008   DEPRESSION 04/18/2008   GERD 04/18/2008   Glaucoma    HEPATITIS B, HX OF 04/18/2008   HYPERLIPIDEMIA 04/18/2008   HYPERTENSION 04/18/2008   Impaired glucose tolerance 04/23/2011   Past Surgical History:  Procedure Laterality Date   APPENDECTOMY     COLONOSCOPY     COLOSTOMY     LIVER SURGERY      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 He has quit using smokeless tobacco. He reports current alcohol use. He reports that he does not use drugs. family history includes Breast cancer in his maternal aunt; Colon cancer in his maternal grandfather; Diabetes in his father and sister; Prostate cancer in his maternal uncle. No Known Allergies Current Outpatient Medications on File Prior to Visit  Medication Sig Dispense Refill   albuterol (VENTOLIN HFA) 108 (90 Base) MCG/ACT inhaler INHALE 2 PUFFS BY MOUTH EVERY 6 HOURS AS NEEDED FOR WHEEZE 8.5 g 3   amitriptyline (ELAVIL) 10 MG tablet Take 10 mg by mouth at bedtime.     amLODipine-olmesartan (AZOR) 5-40 MG tablet TAKE 1 TABLET BY MOUTH EVERY DAY 90 tablet 3   brimonidine (ALPHAGAN) 0.2 % ophthalmic solution Apply to eye.     cyclobenzaprine (FLEXERIL) 10 MG tablet Take 1  tablet (10 mg total) by mouth 3 (three) times daily as needed for muscle spasms. 15 tablet 0   hydrALAZINE (APRESOLINE) 50 MG tablet TAKE 1 TABLET BY MOUTH THREE TIMES A DAY 270 tablet 0   HYDROcodone-acetaminophen (VICODIN) 5-500 MG per tablet Take 1 tablet by mouth every 8 (eight) hours as needed.     latanoprost (XALATAN) 0.005 % ophthalmic solution Place 1 drop into both eyes at bedtime. 0.005 %     lovastatin (MEVACOR) 40 MG tablet TAKE 1 TABLET BY MOUTH EVERY DAY 90 tablet 2   methadone (DOLOPHINE) 5 MG tablet Take 5 mg by mouth daily. 5mg  taken 5 times a day. Total 25mg  per day.     rosuvastatin (CRESTOR) 40 MG tablet TAKE 1 TABLET BY MOUTH EVERY DAY 90 tablet 3   tamsulosin (FLOMAX) 0.4 MG CAPS capsule TAKE 1 CAPSULE BY MOUTH EVERY DAY 90 capsule 3   nitroGLYCERIN (NITROSTAT) 0.4 MG SL tablet Place 1 tablet (0.4 mg total) under the tongue every 5 (five) minutes as needed for chest pain. 40 tablet 5   No  current facility-administered medications on file prior to visit.        ROS:  All others reviewed and negative.  Objective        PE:  BP (!) 142/70 (BP Location: Right Arm, Patient Position: Sitting, Cuff Size: Normal)   Pulse 82   Temp 97.8 F (36.6 C) (Oral)   Ht 6\' 2"  (1.88 m)   Wt 218 lb (98.9 kg)   SpO2 98%   BMI 27.99 kg/m                 Constitutional: Pt appears in NAD               HENT: Head: NCAT.                Right Ear: External ear normal.                 Left Ear: External ear normal.                Eyes: . Pupils are equal, round, and reactive to light. Conjunctivae and EOM are normal               Nose: without d/c or deformity               Neck: Neck supple. Gross normal ROM               Cardiovascular: Normal rate and regular rhythm.                 Pulmonary/Chest: Effort normal and breath sounds without rales or wheezing.                Abd:  Soft, NT, ND, + BS, no organomegaly               Neurological: Pt is alert. At baseline orientation, motor grossly intact               Skin: Skin is warm. No rashes, no other new lesions, LE edema - none               Psychiatric: Pt behavior is normal without agitation   Micro: none  Cardiac tracings I have personally interpreted today:  none  Pertinent Radiological findings (summarize): none   Lab Results  Component Value Date   WBC 7.5 05/20/2023   HGB 14.8 05/20/2023  HCT 44.6 05/20/2023   PLT 229.0 05/20/2023   GLUCOSE 160 (H) 05/20/2023   CHOL 125 05/20/2023   TRIG 143.0 05/20/2023   HDL 48.80 05/20/2023   LDLDIRECT 89.0 07/17/2018   LDLCALC 48 05/20/2023   ALT 17 05/20/2023   AST 17 05/20/2023   NA 139 05/20/2023   K 4.6 05/20/2023   CL 103 05/20/2023   CREATININE 0.93 05/20/2023   BUN 18 05/20/2023   CO2 30 05/20/2023   TSH 1.22 05/20/2023   PSA 0.51 05/20/2023   INR 0.84 02/22/2010   HGBA1C 5.9 05/20/2023   Assessment/Plan:  Ryan Davila is a 73 y.o. White or Caucasian [1]  male with  has a past medical history of ALLERGIC RHINITIS (04/18/2008), Allergy, Arthritis, Asthma, Blood transfusion without reported diagnosis, Cancer (HCC), Cataract, COLON CANCER, HX OF (04/18/2008), DEPRESSION (04/18/2008), GERD (04/18/2008), Glaucoma, HEPATITIS B, HX OF (04/18/2008), HYPERLIPIDEMIA (04/18/2008), HYPERTENSION (04/18/2008), and Impaired glucose tolerance (04/23/2011).  Encounter for well adult exam with abnormal findings Age and sex appropriate education and counseling updated with regular exercise and diet Referrals for preventative services - none needed Immunizations addressed - declines flu shot Smoking counseling  - none needed Evidence for depression or other mood disorder - none significant Most recent labs reviewed. I have personally reviewed and have noted: 1) the patient's medical and social history 2) The patient's current medications and supplements 3) The patient's height, weight, and BMI have been recorded in the chart   Vitamin D deficiency Last vitamin D Lab Results  Component Value Date   VD25OH 33.92 05/20/2023   Low, to start oral replacement   Impaired glucose tolerance Lab Results  Component Value Date   HGBA1C 5.9 05/20/2023   Stable, pt to continue current medical treatment  - diet, wt control   Hyperlipidemia Lab Results  Component Value Date   LDLCALC 48 05/20/2023   Stable, pt to continue current statin crestor 40 qd   Essential hypertension BP Readings from Last 3 Encounters:  03/04/24 (!) 142/70  12/03/23 120/68  05/20/23 130/78   Uncontrolled, likely reactive, pt to continue medical treatment azor 5 40 qd   Primary osteoarthritis of both knees With large degenerative changes and pain swelling left > right - for ortho referral, plain films today  Followup: No follow-ups on file.  Oliver Barre, MD 03/06/2024 8:47 PM [facility] [place] [facility]

## 2024-03-04 NOTE — Patient Instructions (Signed)
 Please continue all other medications as before, and refills have been done if requested.  Please have the pharmacy call with any other refills you may need.  Please continue your efforts at being more active, low cholesterol diet, and weight control.  You are otherwise up to date with prevention measures today.  Please keep your appointments with your specialists as you may have planned - Surgury for the right shoulder soon  You will be contacted regarding the referral for: Ortho - Dr Lequita Halt  Please go to the XRAY Department in the first floor for the x-ray testing  You will be contacted by phone if any changes need to be made immediately.  Otherwise, you will receive a letter about your results with an explanation, but please check with MyChart first.  Please make an Appointment to return in 6 months, or sooner if needed

## 2024-03-06 ENCOUNTER — Encounter: Payer: Self-pay | Admitting: Internal Medicine

## 2024-03-06 DIAGNOSIS — M170 Bilateral primary osteoarthritis of knee: Secondary | ICD-10-CM | POA: Insufficient documentation

## 2024-03-06 NOTE — Assessment & Plan Note (Signed)
 BP Readings from Last 3 Encounters:  03/04/24 (!) 142/70  12/03/23 120/68  05/20/23 130/78   Uncontrolled, likely reactive, pt to continue medical treatment azor 5 40 qd

## 2024-03-06 NOTE — Assessment & Plan Note (Signed)
Age and sex appropriate education and counseling updated with regular exercise and diet Referrals for preventative services - none needed Immunizations addressed - declines flu shot Smoking counseling  - none needed Evidence for depression or other mood disorder - none significant Most recent labs reviewed. I have personally reviewed and have noted: 1) the patient's medical and social history 2) The patient's current medications and supplements 3) The patient's height, weight, and BMI have been recorded in the chart  

## 2024-03-06 NOTE — Assessment & Plan Note (Signed)
Lab Results  Component Value Date   HGBA1C 5.9 05/20/2023   Stable, pt to continue current medical treatment  - diet,wt control  

## 2024-03-06 NOTE — Assessment & Plan Note (Signed)
Last vitamin D Lab Results  Component Value Date   VD25OH 33.92 05/20/2023   Low, to start oral replacement

## 2024-03-06 NOTE — Assessment & Plan Note (Addendum)
 With large degenerative changes and pain swelling left > right - for ortho referral, plain films today

## 2024-03-06 NOTE — Assessment & Plan Note (Signed)
 Lab Results  Component Value Date   LDLCALC 48 05/20/2023   Stable, pt to continue current statin crestor 40 qd

## 2024-03-24 ENCOUNTER — Ambulatory Visit (INDEPENDENT_AMBULATORY_CARE_PROVIDER_SITE_OTHER): Payer: Self-pay | Admitting: Podiatry

## 2024-03-24 ENCOUNTER — Ambulatory Visit (INDEPENDENT_AMBULATORY_CARE_PROVIDER_SITE_OTHER)

## 2024-03-24 DIAGNOSIS — M79671 Pain in right foot: Secondary | ICD-10-CM | POA: Diagnosis not present

## 2024-03-24 DIAGNOSIS — M19079 Primary osteoarthritis, unspecified ankle and foot: Secondary | ICD-10-CM

## 2024-03-24 DIAGNOSIS — M79672 Pain in left foot: Secondary | ICD-10-CM | POA: Diagnosis not present

## 2024-03-24 NOTE — Progress Notes (Signed)
   Chief Complaint  Patient presents with   Foot Pain    RM#6 Bilateral foot pain for about 1 year no injuries to his feet. Worsening over the last 6 months patient is not diabetic.    HPI: 73 y.o. male presenting today as a new patient for evaluation of generalized foot pain bilateral for over 1 year now.  Increased pain with activity.  Denies any history of injury.  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  Dermatology: Skin is warm, dry and supple bilateral lower extremities.   Vascular: Palpable pedal pulses bilaterally. Capillary refill within normal limits.  No appreciable edema.  No erythema.  Neurological: Grossly intact via light touch  Musculoskeletal Exam: No pedal deformities noted  Radiographic Exam B/L feet 03/24/2024:  Moderate degenerative changes noted throughout the joints of the foot bilateral.  No acute fractures identified  Assessment/Plan of Care: 1.  Generalized foot pain bilateral secondary to activity 2.  Generalized arthritis bilateral feet  -Patient evaluated.  X-rays reviewed -I do believe the best long-term solution for the patient would be appropriate shoe gear  with arch supports.  Recommend [employer] -Also recommend OTC prefabricated arch supports to support the medial longitudinal arch of the foot and offload pressure from the forefoot -Advised against going barefoot -Return to clinic as needed     Felecia Shelling, DPM [facility]  Dr. Felecia Shelling, DPM    [address] [place], [place] [address]                Office [phone]  Fax [phone]

## 2024-04-08 ENCOUNTER — Other Ambulatory Visit: Payer: Self-pay

## 2024-04-08 ENCOUNTER — Other Ambulatory Visit: Payer: Self-pay | Admitting: Internal Medicine

## 2024-04-09 ENCOUNTER — Other Ambulatory Visit: Payer: Self-pay | Admitting: Internal Medicine

## 2024-04-09 ENCOUNTER — Other Ambulatory Visit: Payer: Self-pay

## 2024-06-02 ENCOUNTER — Ambulatory Visit: Payer: Medicare HMO | Admitting: Internal Medicine

## 2024-06-22 ENCOUNTER — Other Ambulatory Visit: Payer: Self-pay | Admitting: Internal Medicine

## 2024-09-07 ENCOUNTER — Encounter: Payer: Self-pay | Admitting: Internal Medicine

## 2024-09-07 ENCOUNTER — Ambulatory Visit: Payer: Self-pay | Admitting: Internal Medicine

## 2024-09-07 ENCOUNTER — Ambulatory Visit (INDEPENDENT_AMBULATORY_CARE_PROVIDER_SITE_OTHER): Admitting: Internal Medicine

## 2024-09-07 VITALS — BP 142/76 | HR 78 | Temp 98.0°F | Ht 74.0 in | Wt 220.6 lb

## 2024-09-07 DIAGNOSIS — R7302 Impaired glucose tolerance (oral): Secondary | ICD-10-CM

## 2024-09-07 DIAGNOSIS — E559 Vitamin D deficiency, unspecified: Secondary | ICD-10-CM

## 2024-09-07 DIAGNOSIS — E7800 Pure hypercholesterolemia, unspecified: Secondary | ICD-10-CM

## 2024-09-07 DIAGNOSIS — I10 Essential (primary) hypertension: Secondary | ICD-10-CM

## 2024-09-07 DIAGNOSIS — E538 Deficiency of other specified B group vitamins: Secondary | ICD-10-CM | POA: Diagnosis not present

## 2024-09-07 DIAGNOSIS — Z125 Encounter for screening for malignant neoplasm of prostate: Secondary | ICD-10-CM | POA: Diagnosis not present

## 2024-09-07 LAB — CBC WITH DIFFERENTIAL/PLATELET
Basophils Absolute: 0 K/uL (ref 0.0–0.1)
Basophils Relative: 0.5 % (ref 0.0–3.0)
Eosinophils Absolute: 0.1 K/uL (ref 0.0–0.7)
Eosinophils Relative: 1.9 % (ref 0.0–5.0)
HCT: 39 % (ref 39.0–52.0)
Hemoglobin: 13 g/dL (ref 13.0–17.0)
Lymphocytes Relative: 21.4 % (ref 12.0–46.0)
Lymphs Abs: 1.6 K/uL (ref 0.7–4.0)
MCHC: 33.2 g/dL (ref 30.0–36.0)
MCV: 86.7 fl (ref 78.0–100.0)
Monocytes Absolute: 0.6 K/uL (ref 0.1–1.0)
Monocytes Relative: 8.7 % (ref 3.0–12.0)
Neutro Abs: 4.9 K/uL (ref 1.4–7.7)
Neutrophils Relative %: 67.5 % (ref 43.0–77.0)
Platelets: 255 K/uL (ref 150.0–400.0)
RBC: 4.5 Mil/uL (ref 4.22–5.81)
RDW: 13.8 % (ref 11.5–15.5)
WBC: 7.3 K/uL (ref 4.0–10.5)

## 2024-09-07 LAB — URINALYSIS, ROUTINE W REFLEX MICROSCOPIC
Bilirubin Urine: NEGATIVE
Hgb urine dipstick: NEGATIVE
Ketones, ur: NEGATIVE
Leukocytes,Ua: NEGATIVE
Nitrite: NEGATIVE
Specific Gravity, Urine: >=1.03 — AB (ref 1.000–1.030)
Total Protein, Urine: 30 — AB
Urine Glucose: NEGATIVE
Urobilinogen, UA: 0.2 (ref 0.0–1.0)
pH: 6 (ref 5.0–8.0)

## 2024-09-07 LAB — LIPID PANEL
Cholesterol: 128 mg/dL (ref 0–200)
HDL: 49.9 mg/dL (ref >39.00)
LDL Cholesterol: 59 mg/dL (ref 0–99)
NonHDL: 78.36
Total CHOL/HDL Ratio: 3
Triglycerides: 97 mg/dL (ref 0.0–149.0)
VLDL: 19.4 mg/dL (ref 0.0–40.0)

## 2024-09-07 LAB — VITAMIN D 25 HYDROXY (VIT D DEFICIENCY, FRACTURES): VITD: 29.16 ng/mL — ABNORMAL LOW (ref 30.00–100.00)

## 2024-09-07 LAB — BASIC METABOLIC PANEL WITH GFR
BUN: 16 mg/dL (ref 6–23)
CO2: 30 meq/L (ref 19–32)
Calcium: 9.1 mg/dL (ref 8.4–10.5)
Chloride: 105 meq/L (ref 96–112)
Creatinine, Ser: 0.95 mg/dL (ref 0.40–1.50)
GFR: 80.31 mL/min (ref >60.00)
Glucose, Bld: 95 mg/dL (ref 70–99)
Potassium: 4.1 meq/L (ref 3.5–5.1)
Sodium: 141 meq/L (ref 135–145)

## 2024-09-07 LAB — TSH: TSH: 1.28 u[IU]/mL (ref 0.35–5.50)

## 2024-09-07 LAB — HEPATIC FUNCTION PANEL
ALT: 17 U/L (ref 0–53)
AST: 17 U/L (ref 0–37)
Albumin: 4 g/dL (ref 3.5–5.2)
Alkaline Phosphatase: 51 U/L (ref 39–117)
Bilirubin, Direct: 0.1 mg/dL (ref 0.0–0.3)
Total Bilirubin: 0.5 mg/dL (ref 0.2–1.2)
Total Protein: 6.1 g/dL (ref 6.0–8.3)

## 2024-09-07 LAB — VITAMIN B12: Vitamin B-12: 187 pg/mL — ABNORMAL LOW (ref 211–911)

## 2024-09-07 LAB — PSA: PSA: 0.42 ng/mL (ref 0.10–4.00)

## 2024-09-07 LAB — HEMOGLOBIN A1C: Hgb A1c MFr Bld: 6.7 % — ABNORMAL HIGH (ref 4.6–6.5)

## 2024-09-07 MED ORDER — AMLODIPINE-OLMESARTAN 10-40 MG PO TABS: 1.0000 | ORAL_TABLET | Freq: Every day | ORAL | 3 refills | Status: AC

## 2024-09-07 NOTE — Progress Notes (Unsigned)
 Patient ID: Ryan Davila, male   DOB: 11-06-1951, 73 y.o.   MRN: [id number]        Chief Complaint: follow up HTN, HLD and hyperglycemia low vit d       HPI:  Ryan Davila is a 73 y.o. male here overall doing ok, S/p right shoulder replacement doing well.  Declines to take asa 48,  retired since 2002 due to cancer and unable to drive.  Pt denies chest pain, increased sob or doe, wheezing, orthopnea, PND, increased LE swelling, palpitations, dizziness or syncope.   Pt denies polydipsia, polyuria, or new focal neuro s/s.    Pt denies fever, wt loss, night sweats, loss of appetite, or other constitutional symptoms   Wt Readings from Last 3 Encounters:  09/07/24 220 lb 9.6 oz (100.1 kg)  03/04/24 218 lb (98.9 kg)  12/03/23 223 lb (101.2 kg)   BP Readings from Last 3 Encounters:  09/07/24 (!) 142/76  03/04/24 (!) 142/70  12/03/23 120/68         Past Medical History:  Diagnosis Date   ALLERGIC RHINITIS 04/18/2008   Allergy    Arthritis    Asthma    Blood transfusion without reported diagnosis    as infant   Cancer (HCC)    colon cancer   Cataract    COLON CANCER, HX OF 04/18/2008   DEPRESSION 04/18/2008   GERD 04/18/2008   Glaucoma    HEPATITIS B, HX OF 04/18/2008   HYPERLIPIDEMIA 04/18/2008   HYPERTENSION 04/18/2008   Impaired glucose tolerance 04/23/2011   Past Surgical History:  Procedure Laterality Date   APPENDECTOMY     COLONOSCOPY     COLOSTOMY     LIVER SURGERY     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 He has quit using smokeless tobacco. He reports current alcohol use. He reports that he does not use drugs. family history includes Breast cancer in his maternal aunt; Colon cancer in his maternal grandfather; Diabetes in his father and sister; Prostate cancer in his maternal uncle. No Known  Allergies Current Outpatient Medications on File Prior to Visit  Medication Sig Dispense Refill   albuterol  (VENTOLIN  HFA) 108 (90 Base) MCG/ACT inhaler INHALE 2 PUFFS BY MOUTH EVERY 6 HOURS AS NEEDED FOR WHEEZING 18 each 3   amitriptyline (ELAVIL) 10 MG tablet Take 10 mg by mouth at bedtime.     brimonidine (ALPHAGAN) 0.2 % ophthalmic solution Apply to eye.     cyclobenzaprine  (FLEXERIL ) 10 MG tablet Take 1 tablet (10 mg total) by mouth 3 (three) times daily as needed for muscle spasms. 15 tablet 0   HYDROcodone-acetaminophen (VICODIN) 5-500 MG per tablet Take 1 tablet by mouth every 8 (eight) hours as needed.     latanoprost (XALATAN) 0.005 % ophthalmic solution Place 1 drop into both eyes at bedtime. 0.005 %     lovastatin  (MEVACOR ) 40 MG tablet TAKE 1 TABLET BY MOUTH EVERY DAY 90 tablet 2   methadone  (DOLOPHINE ) 5 MG tablet Take 5 mg by mouth daily. 5mg  taken 5 times a day. Total 25mg  per day.     rosuvastatin  (CRESTOR ) 40 MG tablet TAKE 1 TABLET BY MOUTH EVERY DAY 90 tablet 3   tamsulosin  (FLOMAX ) 0.4 MG  CAPS capsule TAKE 1 CAPSULE BY MOUTH EVERY DAY 90 capsule 3   hydrALAZINE  (APRESOLINE ) 50 MG tablet TAKE 1 TABLET BY MOUTH THREE TIMES A DAY (Patient not taking: Reported on 09/07/2024) 270 tablet 0   nitroGLYCERIN  (NITROSTAT ) 0.4 MG SL tablet Place 1 tablet (0.4 mg total) under the tongue every 5 (five) minutes as needed for chest pain. (Patient not taking: Reported on 09/07/2024) 40 tablet 5   No current facility-administered medications on file prior to visit.        ROS:  All others reviewed and negative.  Objective        PE:  BP (!) 142/76   Pulse 78   Temp 98 F (36.7 C)   Ht 6' 2 (1.88 m)   Wt 220 lb 9.6 oz (100.1 kg)   SpO2 96%   BMI 28.32 kg/m                 Constitutional: Pt appears in NAD               HENT: Head: NCAT.                Right Ear: External ear normal.                 Left Ear: External ear normal.                Eyes: . Pupils are equal, round, and  reactive to light. Conjunctivae and EOM are normal               Nose: without d/c or deformity               Neck: Neck supple. Gross normal ROM               Cardiovascular: Normal rate and regular rhythm.                 Pulmonary/Chest: Effort normal and breath sounds without rales or wheezing.                Abd:  Soft, NT, ND, + BS, no organomegaly               Neurological: Pt is alert. At baseline orientation, motor grossly intact               Skin: Skin is warm. No rashes, no other new lesions, LE edema - none               Psychiatric: Pt behavior is normal without agitation   Micro: none  Cardiac tracings I have personally interpreted today:  none  Pertinent Radiological findings (summarize): none   Lab Results  Component Value Date   WBC 7.3 09/07/2024   HGB 13.0 09/07/2024   HCT 39.0 09/07/2024   PLT 255.0 09/07/2024   GLUCOSE 95 09/07/2024   CHOL 128 09/07/2024   TRIG 97.0 09/07/2024   HDL 49.90 09/07/2024   LDLDIRECT 89.0 07/17/2018   LDLCALC 59 09/07/2024   ALT 17 09/07/2024   AST 17 09/07/2024   NA 141 09/07/2024   K 4.1 09/07/2024   CL 105 09/07/2024   CREATININE 0.95 09/07/2024   BUN 16 09/07/2024   CO2 30 09/07/2024   TSH 1.28 09/07/2024   PSA 0.42 09/07/2024   INR 0.84 02/22/2010   HGBA1C 6.7 (H) 09/07/2024   Assessment/Plan:  Ryan Davila is a 73 y.o. White or Caucasian [1] male with  has a past medical  history of ALLERGIC RHINITIS (04/18/2008), Allergy, Arthritis, Asthma, Blood transfusion without reported diagnosis, Cancer (HCC), Cataract, COLON CANCER, HX OF (04/18/2008), DEPRESSION (04/18/2008), GERD (04/18/2008), Glaucoma, HEPATITIS B, HX OF (04/18/2008), HYPERLIPIDEMIA (04/18/2008), HYPERTENSION (04/18/2008), and Impaired glucose tolerance (04/23/2011).  Vitamin D  deficiency Last vitamin D  Lab Results  Component Value Date   VD25OH 29.16 (L) 09/07/2024   Low, to start oral replacement   Impaired glucose tolerance Lab Results   Component Value Date   HGBA1C 6.7 (H) 09/07/2024   Stable, pt to continue current medical treatment  - diet, wt control   Hyperlipidemia Lab Results  Component Value Date   LDLCALC 59 09/07/2024   Stable, pt to continue current statin crestor  40 mg qd   Essential hypertension BP Readings from Last 3 Encounters:  09/07/24 (!) 142/76  03/04/24 (!) 142/70  12/03/23 120/68   uncontrolled, pt to increase azor  10 40 mg qd   B12 deficiency Lab Results  Component Value Date   VITAMINB12 187 (L) 09/07/2024   Low, to start oral replacement - b12 1000 mcg qd  Followup: Return in about 6 months (around 03/07/2025).  Lynwood Rush, MD 09/08/2024 8:24 PM [facility]

## 2024-09-07 NOTE — Patient Instructions (Signed)
 Ok to increase the Azor  to 10 - 40 mg per day  Please continue all other medications as before, and refills have been done if requested.  Please have the pharmacy call with any other refills you may need.  Please continue your efforts at being more active, low cholesterol diet, and weight control.  Please keep your appointments with your specialists as you may have planned  Please go to the LAB at the blood drawing area for the tests to be done  You will be contacted by phone if any changes need to be made immediately.  Otherwise, you will receive a letter about your results with an explanation, but please check with MyChart first.  Please make an Appointment to return in 6 months, or sooner if needed

## 2024-09-08 ENCOUNTER — Encounter: Payer: Self-pay | Admitting: Internal Medicine

## 2024-09-08 NOTE — Assessment & Plan Note (Signed)
 BP Readings from Last 3 Encounters:  09/07/24 (!) 142/76  03/04/24 (!) 142/70  12/03/23 120/68   uncontrolled, pt to increase azor  10 40 mg qd

## 2024-09-08 NOTE — Assessment & Plan Note (Signed)
 Last vitamin D  Lab Results  Component Value Date   VD25OH 29.16 (L) 09/07/2024   Low, to start oral replacement

## 2024-09-08 NOTE — Assessment & Plan Note (Signed)
 Lab Results  Component Value Date   LDLCALC 59 09/07/2024   Stable, pt to continue current statin crestor  40 mg qd

## 2024-09-08 NOTE — Assessment & Plan Note (Signed)
 Lab Results  Component Value Date   HGBA1C 6.7 (H) 09/07/2024   Stable, pt to continue current medical treatment  - diet, wt control

## 2024-09-08 NOTE — Assessment & Plan Note (Signed)
 Lab Results  Component Value Date   VITAMINB12 187 (L) 09/07/2024   Low, to start oral replacement - b12 1000 mcg qd

## 2025-03-08 ENCOUNTER — Ambulatory Visit: Admitting: Internal Medicine
